# Patient Record
Sex: Female | Born: 2000 | Hispanic: No | Smoking: Former smoker
Health system: Southern US, Community
[De-identification: ages and names within clinical notes are randomized; demographics above are authoritative.]

## PROBLEM LIST (undated history)

## (undated) ENCOUNTER — Inpatient Hospital Stay (HOSPITAL_COMMUNITY): Payer: Self-pay

## (undated) VITALS — BP 123/79 | HR 92 | Temp 98.4°F | Resp 18 | Ht 65.0 in | Wt 208.3 lb

## (undated) DIAGNOSIS — M0800 Unspecified juvenile rheumatoid arthritis of unspecified site: Secondary | ICD-10-CM

## (undated) DIAGNOSIS — N390 Urinary tract infection, site not specified: Secondary | ICD-10-CM

## (undated) DIAGNOSIS — K219 Gastro-esophageal reflux disease without esophagitis: Secondary | ICD-10-CM

## (undated) DIAGNOSIS — L309 Dermatitis, unspecified: Secondary | ICD-10-CM

## (undated) DIAGNOSIS — F419 Anxiety disorder, unspecified: Secondary | ICD-10-CM

## (undated) DIAGNOSIS — F329 Major depressive disorder, single episode, unspecified: Secondary | ICD-10-CM

## (undated) DIAGNOSIS — J45909 Unspecified asthma, uncomplicated: Secondary | ICD-10-CM

## (undated) DIAGNOSIS — T7840XA Allergy, unspecified, initial encounter: Secondary | ICD-10-CM

## (undated) DIAGNOSIS — E669 Obesity, unspecified: Secondary | ICD-10-CM

## (undated) DIAGNOSIS — F32A Depression, unspecified: Secondary | ICD-10-CM

## (undated) DIAGNOSIS — H539 Unspecified visual disturbance: Secondary | ICD-10-CM

## (undated) DIAGNOSIS — R011 Cardiac murmur, unspecified: Secondary | ICD-10-CM

## (undated) DIAGNOSIS — R51 Headache: Secondary | ICD-10-CM

## (undated) DIAGNOSIS — F909 Attention-deficit hyperactivity disorder, unspecified type: Secondary | ICD-10-CM

## (undated) HISTORY — PX: BREAST SURGERY: SHX581

## (undated) HISTORY — PX: NO PAST SURGERIES: SHX2092

---
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
## 2019-01-10 ENCOUNTER — Encounter (HOSPITAL_COMMUNITY): Payer: Self-pay

## 2019-01-10 ENCOUNTER — Other Ambulatory Visit: Payer: Self-pay

## 2019-01-10 ENCOUNTER — Emergency Department (HOSPITAL_COMMUNITY)
Admission: EM | Admit: 2019-01-10 | Discharge: 2019-01-11 | Disposition: A | Discharge status: Home / Self Care | Payer: Self-pay | Attending: Emergency Medicine | Admitting: Emergency Medicine

## 2019-01-10 DIAGNOSIS — Z79899 Other long term (current) drug therapy: Secondary | ICD-10-CM | POA: Insufficient documentation

## 2019-01-10 DIAGNOSIS — R1011 Right upper quadrant pain: Secondary | ICD-10-CM | POA: Insufficient documentation

## 2019-01-10 DIAGNOSIS — J45909 Unspecified asthma, uncomplicated: Secondary | ICD-10-CM | POA: Insufficient documentation

## 2019-01-10 DIAGNOSIS — R109 Unspecified abdominal pain: Secondary | ICD-10-CM

## 2019-01-10 DIAGNOSIS — F17210 Nicotine dependence, cigarettes, uncomplicated: Secondary | ICD-10-CM | POA: Insufficient documentation

## 2019-01-10 LAB — COMPREHENSIVE METABOLIC PANEL
ALT: 22 U/L (ref 0–44)
AST: 21 U/L (ref 15–41)
Albumin: 4.1 g/dL (ref 3.5–5.0)
Alkaline Phosphatase: 72 U/L (ref 38–126)
Anion gap: 10 (ref 5–15)
BUN: 8 mg/dL (ref 6–20)
CO2: 22 mmol/L (ref 22–32)
Calcium: 9.3 mg/dL (ref 8.9–10.3)
Chloride: 105 mmol/L (ref 98–111)
Creatinine, Ser: 0.67 mg/dL (ref 0.44–1.00)
GFR calc Af Amer: >60 mL/min (ref >60)
GFR calc non Af Amer: >60 mL/min (ref >60)
Glucose, Bld: 84 mg/dL (ref 70–99)
Potassium: 3.8 mmol/L (ref 3.5–5.1)
Sodium: 137 mmol/L (ref 135–145)
Total Bilirubin: 0.2 mg/dL — ABNORMAL LOW (ref 0.3–1.2)
Total Protein: 7.9 g/dL (ref 6.5–8.1)

## 2019-01-10 LAB — CBC
HCT: 41.1 % (ref 36.0–46.0)
Hemoglobin: 13.2 g/dL (ref 12.0–15.0)
MCH: 26.2 pg (ref 26.0–34.0)
MCHC: 32.1 g/dL (ref 30.0–36.0)
MCV: 81.7 fL (ref 80.0–100.0)
Platelets: 262 10*3/uL (ref 150–400)
RBC: 5.03 MIL/uL (ref 3.87–5.11)
RDW: 14.2 % (ref 11.5–15.5)
WBC: 10.2 10*3/uL (ref 4.0–10.5)
nRBC: 0 % (ref 0.0–0.2)

## 2019-01-10 LAB — URINALYSIS, ROUTINE W REFLEX MICROSCOPIC
Bilirubin Urine: NEGATIVE
Glucose, UA: NEGATIVE mg/dL
Hgb urine dipstick: NEGATIVE
Ketones, ur: NEGATIVE mg/dL
Leukocytes,Ua: NEGATIVE
Nitrite: NEGATIVE
Protein, ur: NEGATIVE mg/dL
Specific Gravity, Urine: 1.023 (ref 1.005–1.030)
pH: 6 (ref 5.0–8.0)

## 2019-01-10 LAB — LIPASE, BLOOD: Lipase: 32 U/L (ref 11–51)

## 2019-01-10 LAB — HCG, QUANTITATIVE, PREGNANCY: hCG, Beta Chain, Quant, S: <1 m[IU]/mL (ref <5)

## 2019-01-10 MED ORDER — SODIUM CHLORIDE 0.9% FLUSH: 3.0000 mL | Freq: Once | INTRAVENOUS | Status: DC

## 2019-01-10 NOTE — ED Triage Notes (Signed)
Pt BIB EMS from home. Pt c/o abdominal pain and R sided flank pain x 3 days. Pt states she took tylenol and tums with no relief. Pt states she could be pregnant.   160/90 80 HR 99% RA 98.0   CBG 122

## 2019-01-10 NOTE — ED Provider Notes (Signed)
[facility]-EMERGENCY DEPT Provider Note   CSN: [id number] Arrival date & time: 01/10/19  2130     History   Chief Complaint Chief Complaint  Patient presents with  . Abdominal Pain  . Flank Pain    HPI Michelle Stephens is a 18 y.o. female.     The history is provided by the patient and medical records.  Abdominal Pain Flank Pain Associated symptoms include abdominal pain.     18 year old female with history of ADHD, seasonal allergies, anxiety, depression, asthma, obesity, presenting to the ED with right-sided flank pain for the past 3 days.  States it does radiate somewhat towards her right groin.  She denies any dysuria or hematuria.  She does feel some pressure when having a bowel movement, however they have been normal.  Last menstrual period was 12/18/2018, was normal.  States she is actively trying to get pregnant so unsure if she is or not.  She did have a miscarriage earlier this year.  No vaginal discharge.  No concern for STD.  Has had some nausea but states "I have a sensitive stomach" so not entirely unusual for her.   She is requesting to eat/drink here.  Past Medical History:  Diagnosis Date  . ADHD (attention deficit hyperactivity disorder)   . Allergy    seasonal  . Anxiety   . Asthma   . Depression   . Headache(784.0)   . Heart murmur    when a baby  . JRA (juvenile rheumatoid arthritis) (HCC)   . Obesity   . Urinary tract infection   . Vision abnormalities     Patient Active Problem List   Diagnosis Date Noted  . Self-inflicted injury   . PTSD (post-traumatic stress disorder) 02/04/2013  . ADHD (attention deficit hyperactivity disorder), combined type 02/04/2013  . MDD (major depressive disorder), recurrent episode, severe 11/07/2011    Class: Diagnosis of    Past Surgical History:  Procedure Laterality Date  . NO PAST SURGERIES       OB History    Gravida  1   Para      Term      Preterm      AB      Living  0     SAB      TAB      Ectopic      Multiple      Live Births               Home Medications    Prior to Admission medications   Medication Sig Start Date End Date Taking? Authorizing Provider  cetirizine (ZYRTEC) 10 MG tablet Take 10 mg by mouth daily.  02/04/13   Winson, Louie BunKim B, NP  fluticasone (FLONASE) 50 MCG/ACT nasal spray inhale one to two sprays each nare daily 05/20/17   [provider]  metroNIDAZOLE (FLAGYL) 500 MG tablet Take 1 tablet (500 mg total) by mouth 2 (two) times daily. 05/03/18   Gerrit HeckEmly, Jessica, CNM  Prenatal Vit-Fe Fumarate-FA (PRENATAL MULTIVITAMIN) TABS tablet Take 1 tablet by mouth daily at 12 noon.    [provider]  ranitidine (ZANTAC) 150 MG tablet Take 150 mg by mouth 2 (two) times daily.    [provider]    Family History Family History  Problem Relation Age of Onset  . Asthma Mother   . Diabetes Mother   . Hypertension Mother   . Depression Mother   . Mental illness Mother  Social History Social History   Tobacco Use  . Smoking status: Current Every Day Smoker    Packs/day: 0.25    Types: Cigarettes  . Smokeless tobacco: Former Network engineer Use Topics  . Alcohol use: Not Currently    Comment: 5 times/month drinks liquor  . Drug use: Yes    Types: Marijuana     Allergies   Coconut flavor and Penicillins   Review of Systems Review of Systems  Gastrointestinal: Positive for abdominal pain.  Genitourinary: Positive for flank pain.  All other systems reviewed and are negative.    Physical Exam Updated Vital Signs BP 131/74 (BP Location: Right Arm)   Pulse 80   Temp 98 F (36.7 C) (Oral)   Resp 16   Ht 5\' 5"  (1.651 m)   Wt (!) 142.9 kg   LMP 03/21/2018   SpO2 98%   BMI 52.42 kg/m   Physical Exam Vitals signs and nursing note reviewed.  Constitutional:      Appearance: She is well-developed.     Comments: Morbidly obese  HENT:     Head: Normocephalic and atraumatic.   Eyes:     Conjunctiva/sclera: Conjunctivae normal.     Pupils: Pupils are equal, round, and reactive to light.  Neck:     Musculoskeletal: Normal range of motion.  Cardiovascular:     Rate and Rhythm: Normal rate and regular rhythm.     Heart sounds: Normal heart sounds.  Pulmonary:     Effort: Pulmonary effort is normal.     Breath sounds: Normal breath sounds.  Abdominal:     General: Bowel sounds are normal.     Palpations: Abdomen is soft.     Tenderness: There is no abdominal tenderness. There is no right CVA tenderness or left CVA tenderness.     Comments: Obese abdomen but soft and nontender, no CVA tenderness  Musculoskeletal: Normal range of motion.  Skin:    General: Skin is warm and dry.  Neurological:     Mental Status: She is alert and oriented to person, place, and time.      ED Treatments / Results  Labs (all labs ordered are listed, but only abnormal results are displayed) Labs Reviewed  COMPREHENSIVE METABOLIC PANEL - Abnormal; Notable for the following components:      Result Value   Total Bilirubin 0.2 (*)    All other components within normal limits  LIPASE, BLOOD  CBC  URINALYSIS, ROUTINE W REFLEX MICROSCOPIC  HCG, QUANTITATIVE, PREGNANCY  I-STAT BETA HCG BLOOD, ED ([facility], [facility], [facility] ONLY)    EKG None  Radiology No results found.  Procedures Procedures (including critical care time)  Medications Ordered in ED Medications  sodium chloride flush (NS) 0.9 % injection 3 mL (has no administration in time range)  oxyCODONE-acetaminophen (PERCOCET/ROXICET) 5-325 MG per tablet 1 tablet (1 tablet Oral Given 01/11/19 0126)     Initial Impression / Assessment and Plan / ED Course  I have reviewed the triage vital signs and the nursing notes.  Pertinent labs & imaging results that were available during my care of the patient were reviewed by me and considered in my medical decision making (see chart for details).  18 year old female here with  right-sided flank and abdominal pain.  This is been ongoing for 3 days.  She denies any urinary symptoms, pelvic pain, vaginal discharge, or irregular bleeding.  She is afebrile and nontoxic.  Labs are overall reassuring.  Pregnancy test is negative.  UA without  any noted blood or signs of infection.  Patient appears very comfortable here, she is eating and drinking throughout ER visit, no significant tenderness noted on exam.  I do not feel she needs further work-up with CT scan or other emergent imaging.  I recommended that she follow-up closely with her primary care doctor.  She also expresses concern that she has not been able to get pregnant again since her miscarriage earlier this year, I have recommended that she follow-up with her OB/GYN about this to discuss further concerns.  She may return here for any new/acute changes.  Final Clinical Impressions(s) / ED Diagnoses   Final diagnoses:  Flank pain    ED Discharge Orders    None       Garlon Hatchet, PA-C 01/11/19 0340    Nira Conn, MD 01/11/19 [phone]

## 2019-01-11 MED ORDER — OXYCODONE-ACETAMINOPHEN 5-325 MG PO TABS
1.0000 | ORAL_TABLET | Freq: Once | ORAL | Status: AC
  Administered 2019-01-11: 1 via ORAL
  Filled 2019-01-11: qty 1

## 2019-01-11 NOTE — Discharge Instructions (Signed)
Labs today were reassuring.  Urine without blood or signs of infection. Recommend to establish care with primary care doctor for follow-up. Continue to follow with OB-GYN for any ongoing pregnancy/fertility concerns. Return here for any new/acute changes.

## 2019-01-11 NOTE — ED Notes (Signed)
Patient waiting on transportation home.

## 2019-04-16 IMAGING — DX DG CHEST 2V
2 series · 2 of 2 positions shown · non-contrast
Comparison: 06/12/2017

CLINICAL DATA: Cough and chest pain

EXAM:
CHEST - 2 VIEW

[chest pa]
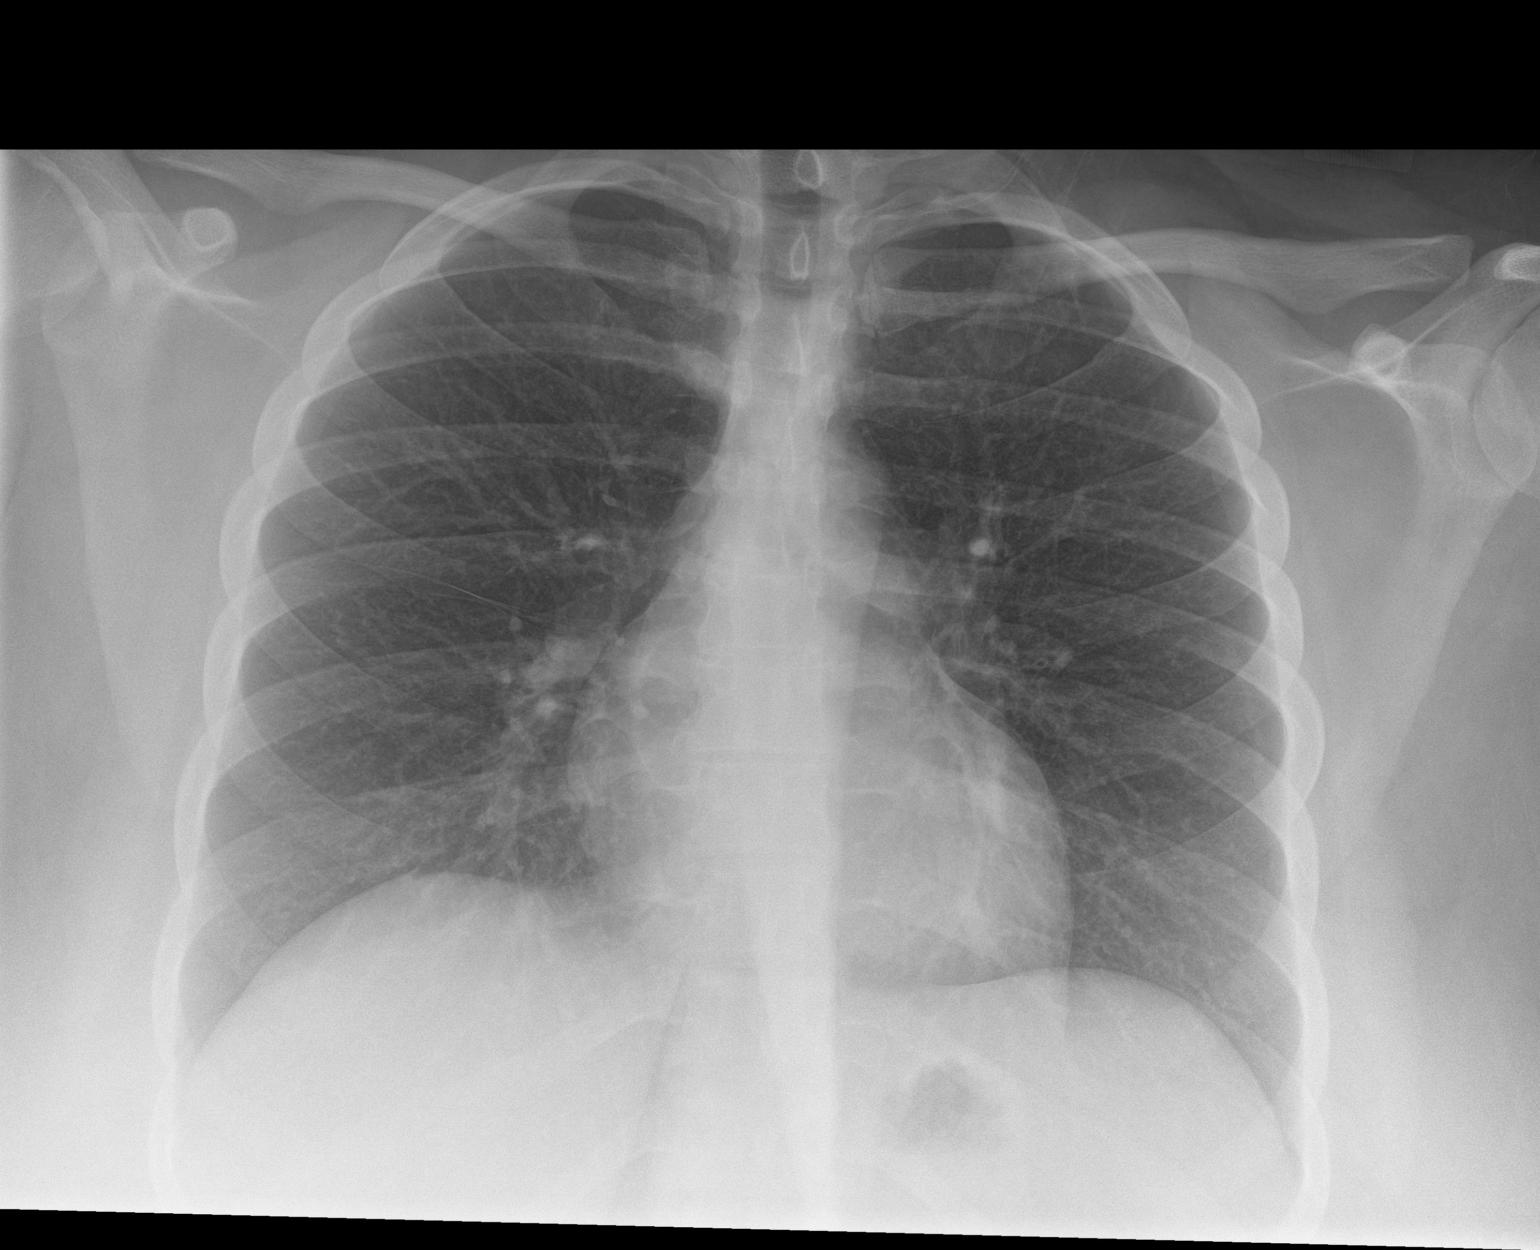

[chest lat]
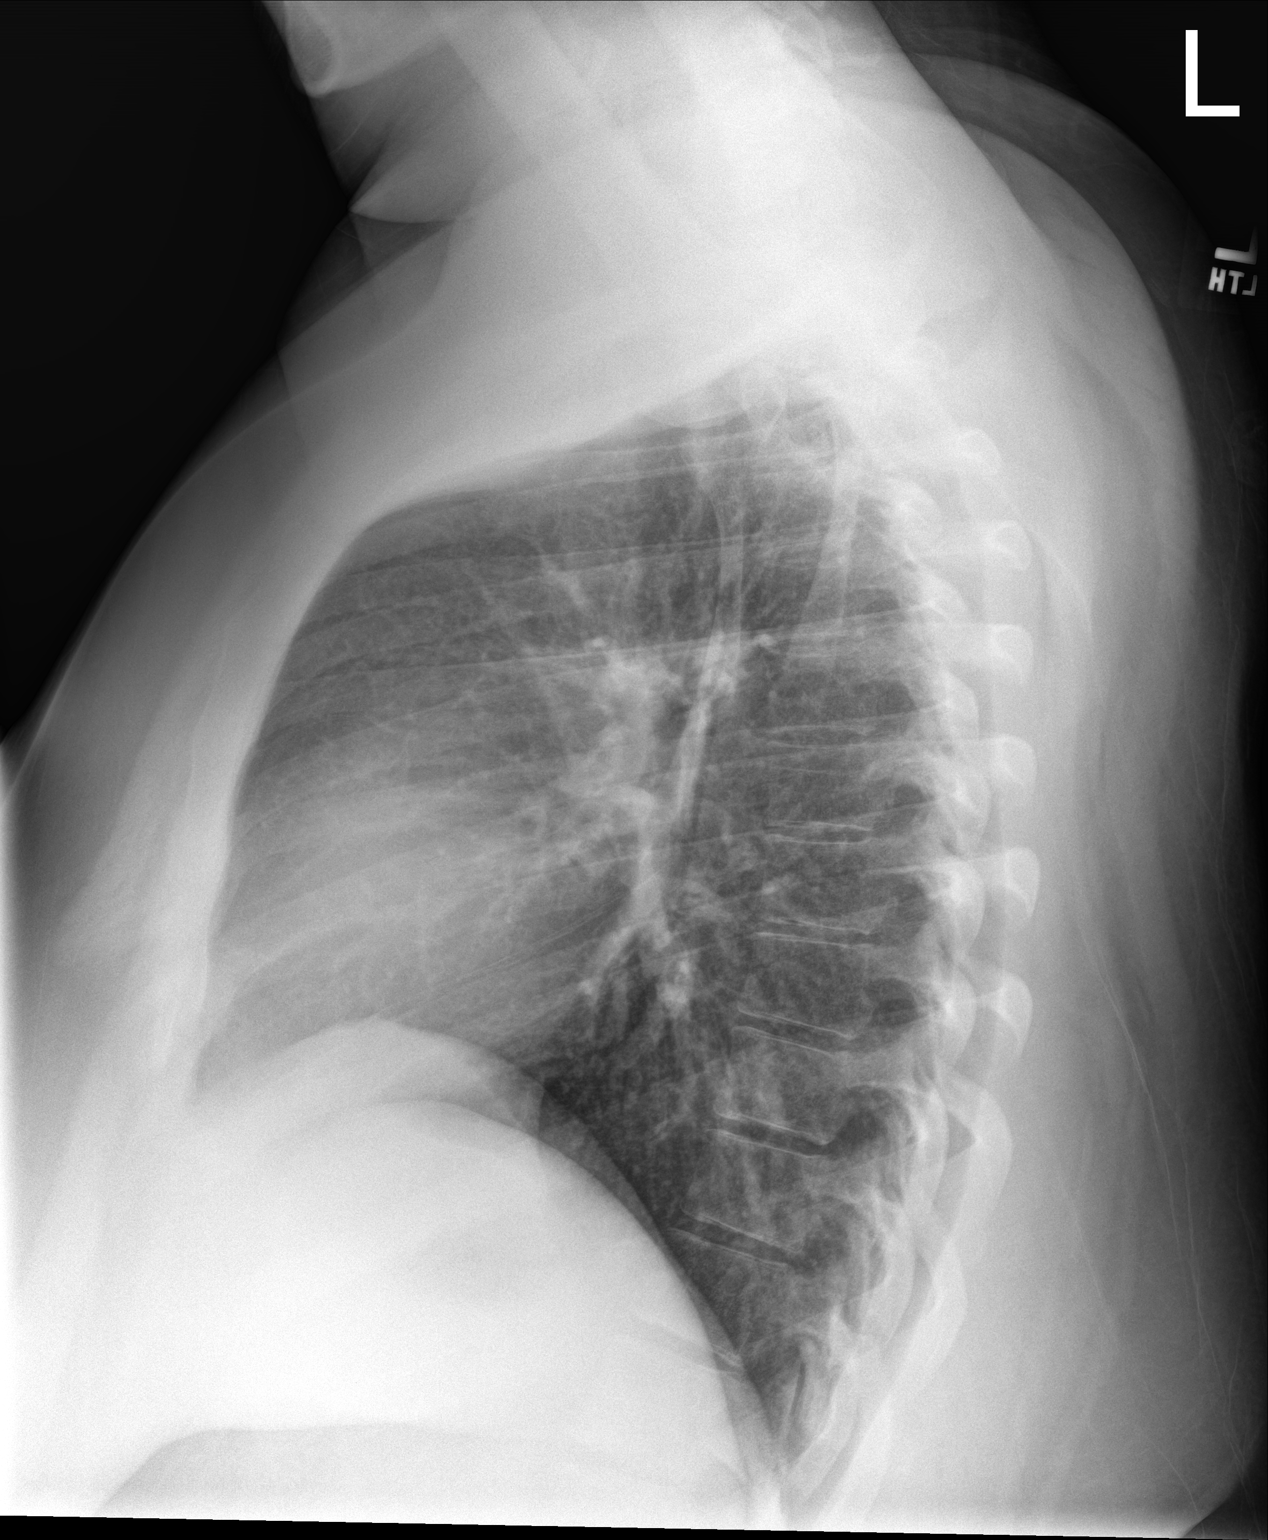

[2 of 2 positions shown; findings below may reference images not displayed]

FINDINGS: The heart size and mediastinal contours are within normal limits.
Both lungs are clear. The visualized skeletal structures are
unremarkable.
IMPRESSION: No active cardiopulmonary disease.

## 2019-06-11 ENCOUNTER — Encounter (HOSPITAL_COMMUNITY): Payer: Self-pay

## 2019-06-11 ENCOUNTER — Emergency Department (HOSPITAL_COMMUNITY)
Admission: EM | Admit: 2019-06-11 | Discharge: 2019-06-11 | Disposition: A | Discharge status: Home / Self Care | Payer: Self-pay | Attending: Emergency Medicine | Admitting: Emergency Medicine

## 2019-06-11 ENCOUNTER — Other Ambulatory Visit: Payer: Self-pay

## 2019-06-11 DIAGNOSIS — J302 Other seasonal allergic rhinitis: Secondary | ICD-10-CM

## 2019-06-11 DIAGNOSIS — Z79899 Other long term (current) drug therapy: Secondary | ICD-10-CM | POA: Insufficient documentation

## 2019-06-11 DIAGNOSIS — M26622 Arthralgia of left temporomandibular joint: Secondary | ICD-10-CM | POA: Insufficient documentation

## 2019-06-11 DIAGNOSIS — M26629 Arthralgia of temporomandibular joint, unspecified side: Secondary | ICD-10-CM

## 2019-06-11 MED ORDER — FEXOFENADINE HCL 60 MG PO TABS: 60.0000 mg | ORAL_TABLET | Freq: Two times a day (BID) | ORAL | 0 refills | Status: DC

## 2019-06-11 MED ORDER — NAPROXEN 500 MG PO TABS: 500.0000 mg | ORAL_TABLET | Freq: Two times a day (BID) | ORAL | 0 refills | Status: AC

## 2019-06-11 NOTE — Discharge Instructions (Addendum)
You may alternate taking Tylenol and Naproxen as needed for pain control. You may take Naproxen twice daily as directed on your discharge paperwork and you may take  [phone] mg of Tylenol every 6 hours. Do not exceed 4000 mg of Tylenol daily as this can lead to liver damage. Also, make sure to take Naproxen with meals as it can cause an upset stomach. Do not take other NSAIDs while taking Naproxen such as (Aleve, Ibuprofen, Aspirin, Celebrex, etc) and do not take more than the prescribed dose as this can lead to ulcers and bleeding in your GI tract. You may use warm and cold compresses to help with your symptoms.   Please use allegra as directed. Stop using your afrin, claritin and benadryl. Continue using your Flonase.  Please follow up with your primary care provider within 5-7 days for re-evaluation of your symptoms. If you do not have a primary care provider, information for a healthcare clinic has been provided for you to make arrangements for follow up care. Please return to the emergency department for any new or worsening symptoms.   Please follow up with your primary doctor within the next 7-10 days for re-evaluation and further treatment of your symptoms.   Please return to the ER sooner if you have any new or worsening symptoms.

## 2019-06-11 NOTE — ED Triage Notes (Signed)
Patient c/o seasonal allergies, left ear pain and shooting pain into her jaw x months, but worsening. Patient also c/o nasal congestin.

## 2019-06-11 NOTE — ED Provider Notes (Signed)
[facility] DEPT Provider Note   CSN: [id number] Arrival date & time: 06/11/19  1512     History Chief Complaint  Patient presents with  . Otalgia  . Jaw Pain  . Nasal Congestion    Michelle Stephens is a 19 y.o. female.  HPI   Patient is a 19 year old female with history of ADHD, anxiety, asthma, depression, heart murmur, juvenile rheumatoid arthritis, who presents to the emergency department today for evaluation of seasonal allergies and left jaw pain.  States that her allergies have been flaring up for the last month.  She has had watery/itchy/dry eyes, nasal congestion, rhinorrhea, scratchy throat, and intermittent cough.  She has been using Afrin, Benadryl, Flonase and Claritin without significant relief.  She denies any fevers.  She also reports left jaw pain.  States it starts just anterior to the left ear and radiates forward and into her ear.  This has also been present for several weeks.  It is worse when she opens her mouth.  She is not aware if she grits her teeth are not.  Denies any interventions for symptoms.  Past Medical History:  Diagnosis Date  . ADHD (attention deficit hyperactivity disorder)   . Allergy    seasonal  . Anxiety   . Asthma   . Depression   . Headache(784.0)   . Heart murmur    when a baby  . JRA (juvenile rheumatoid arthritis) ([place])   . Obesity   . Urinary tract infection   . Vision abnormalities     Patient Active Problem List   Diagnosis Date Noted  . Self-inflicted injury   . PTSD (post-traumatic stress disorder) 02/04/2013  . ADHD (attention deficit hyperactivity disorder), combined type 02/04/2013  . MDD (major depressive disorder), recurrent episode, severe 11/07/2011    Class: Diagnosis of    Past Surgical History:  Procedure Laterality Date  . NO PAST SURGERIES       OB History    Gravida  1   Para      Term      Preterm      AB      Living  0     SAB      TAB      Ectopic      Multiple      Live Births              Family History  Problem Relation Age of Onset  . Asthma Mother   . Diabetes Mother   . Hypertension Mother   . Depression Mother   . Mental illness Mother     Social History   Tobacco Use  . Smoking status: Current Every Day Smoker    Packs/day: 0.25    Types: Cigarettes  . Smokeless tobacco: Former Network engineer Use Topics  . Alcohol use: Not Currently  . Drug use: Yes    Types: Marijuana    Home Medications Prior to Admission medications   Medication Sig Start Date End Date Taking? Authorizing Provider  acetaminophen (TYLENOL) 500 MG tablet Take 1,000 mg by mouth every 6 (six) hours as needed for mild pain, moderate pain or headache.    [provider]  fexofenadine (ALLEGRA ALLERGY) 60 MG tablet Take 1 tablet (60 mg total) by mouth 2 (two) times daily. 06/11/19 07/11/19  Chandel Zaun S, PA-C  naproxen (NAPROSYN) 500 MG tablet Take 1 tablet (500 mg total) by mouth 2 (two) times daily for 7 days. 06/11/19  06/18/19  Lynsee Wands S, PA-C    Allergies    Coconut flavor and Penicillins  Review of Systems   Review of Systems  Constitutional: Negative for fever.  HENT: Positive for congestion, ear pain, rhinorrhea, sinus pressure, sinus pain and sneezing. Negative for sore throat.        Left jaw pain  Eyes: Positive for itching. Negative for pain and visual disturbance.  Respiratory: Positive for cough (mild, intermittent).   Cardiovascular: Negative for chest pain.  Gastrointestinal: Negative for abdominal pain, nausea and vomiting.  Genitourinary: Negative for dysuria and hematuria.  Musculoskeletal: Negative for myalgias.  Skin: Negative for rash.  Neurological: Negative for headaches.  All other systems reviewed and are negative.   Physical Exam Updated Vital Signs BP 126/86 (BP Location: Left Arm)   Pulse 67   Temp 98.3 F (36.8 C) (Oral)   Resp 16   Ht 5\' 5"  (1.651 m)   Wt (!) 145.2  kg   LMP 05/22/2019 (Approximate)   SpO2 100%   BMI 53.27 kg/m   Physical Exam Vitals and nursing note reviewed.  Constitutional:      General: She is not in acute distress.    Appearance: She is well-developed. She is not ill-appearing.  HENT:     Head: Normocephalic and atraumatic.     Ears:     Comments: bilat tms have serous fluid present. No erythema or bulging of tms    Nose:     Comments: bilat nasal turbinates or boggy and erythematous    Mouth/Throat:     Mouth: Mucous membranes are moist.     Pharynx: No oropharyngeal exudate or posterior oropharyngeal erythema.     Comments: ttp to the left TMJ that reproduces sxs, no trismus Eyes:     Conjunctiva/sclera: Conjunctivae normal.  Cardiovascular:     Rate and Rhythm: Normal rate and regular rhythm.  Pulmonary:     Effort: Pulmonary effort is normal.     Breath sounds: Normal breath sounds.  Musculoskeletal:        General: Normal range of motion.     Cervical back: Neck supple.  Skin:    General: Skin is warm and dry.  Neurological:     Mental Status: She is alert.     ED Results / Procedures / Treatments   Labs (all labs ordered are listed, but only abnormal results are displayed) Labs Reviewed - No data to display  EKG None  Radiology No results found.  Procedures Procedures (including critical care time)  Medications Ordered in ED Medications - No data to display  ED Course  I have reviewed the triage vital signs and the nursing notes.  Pertinent labs & imaging results that were available during my care of the patient were reviewed by me and considered in my medical decision making (see chart for details).    MDM Rules/Calculators/A&P                       Patient presenting with symptoms consistent with seasonal allergies.  Vital signs are stable and patient is afebrile.  Nontoxic and nonseptic appearing.  Physical exam was within normal limits.  HEENT exam is grossly normal without evidence  of bacterial infection.  Will give symptomatic treatment with allergy medications.  Advised her that afrin can cause rebound nasal congestion and she should stop using this medication as it is likely exacerbating her sxs. Additionally, she is c/o left jaw pain. She has ttp  to the left TMJ that exacerbates sxs. Suspect tmj dysfuction. Will start on antiinflammatories. Advised to obtain bite guard. Advise follow-up with PCP in 1 week and discussed reasons to return immediately to the ED.  All questions answered and patient understands the plan and reasons to return.  Final Clinical Impression(s) / ED Diagnoses Final diagnoses:  Seasonal allergies  TMJ pain dysfunction syndrome    Rx / DC Orders ED Discharge Orders         Ordered    fexofenadine (ALLEGRA ALLERGY) 60 MG tablet  2 times daily     06/11/19 1620    naproxen (NAPROSYN) 500 MG tablet  2 times daily     06/11/19 [address] [place] Bearse S, PA-C 06/11/19 1621    Vanetta Mulders, MD 06/21/19 [phone]

## 2019-10-03 IMAGING — US TRANSVAGINAL OB ULTRASOUND
1 series · 15 of 28 positions shown · non-contrast
Comparison: 05/03/2018

CLINICAL DATA: Vaginal bleeding. Quantitative beta HCG is 6666.
patient is 7 weeks 5 days and has EDC of 12/26/2018. Inappropriate
rise of beta HCG.

EXAM:
OBSTETRIC <14 WK US AND TRANSVAGINAL OB US
TECHNIQUE: Both transabdominal and transvaginal ultrasound examinations were
performed for complete evaluation of the gestation as well as the
maternal uterus, adnexal regions, and pelvic cul-de-sac.
Transvaginal technique was performed to assess early pregnancy.

[Series 1: transvaginal ob ultrasound · 39 acquisitions, 15 frames shown]
[im 1/39]
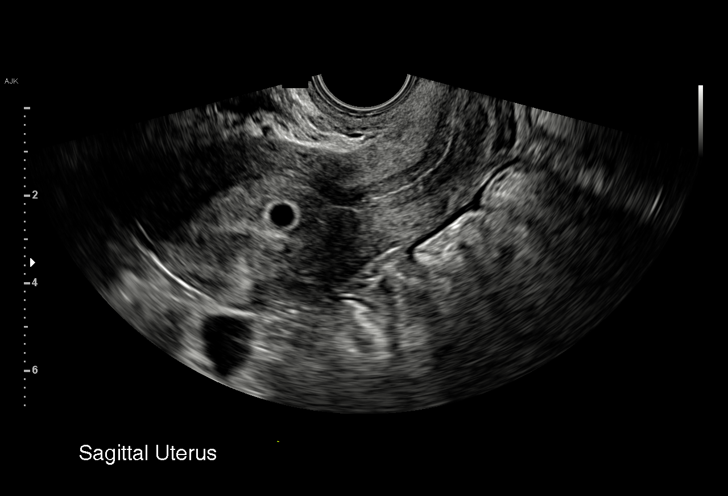
[im 3/39]
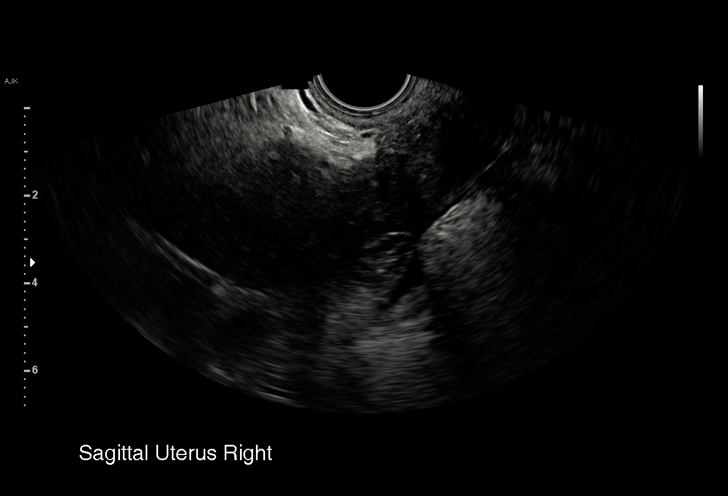
[im 6/39]
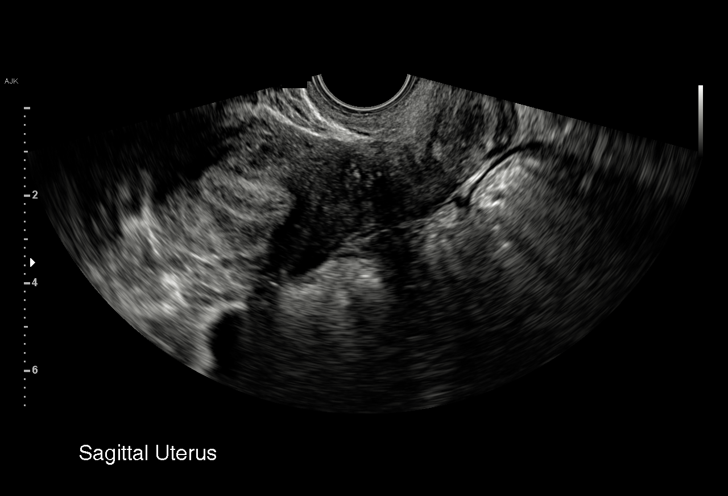
[im 9/39]
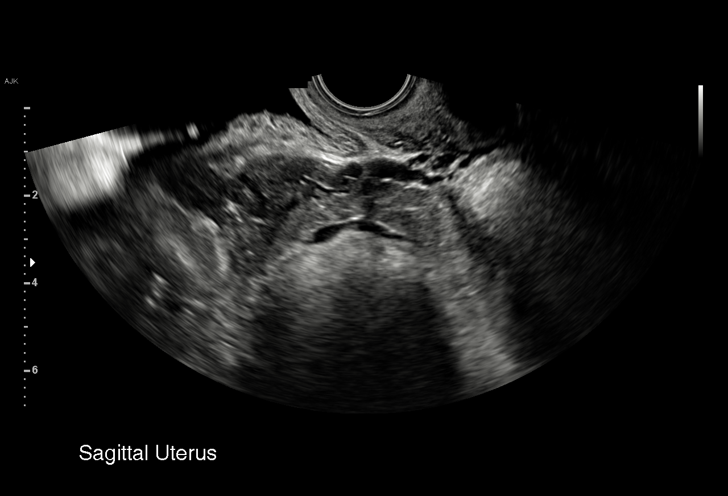
[im 12/39]
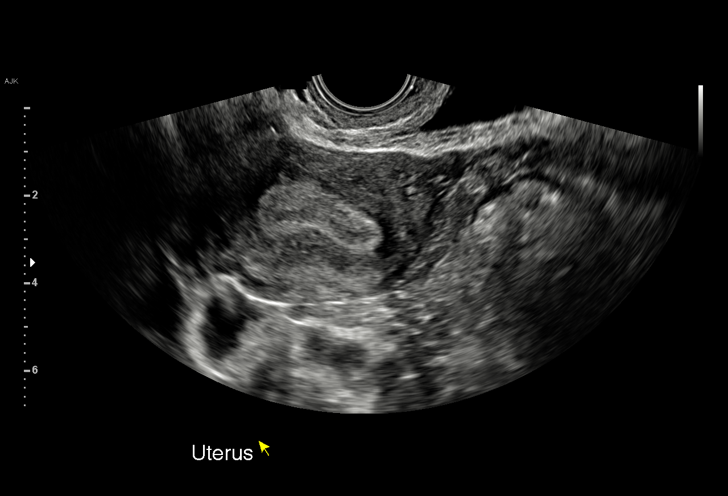
[im 15/39]
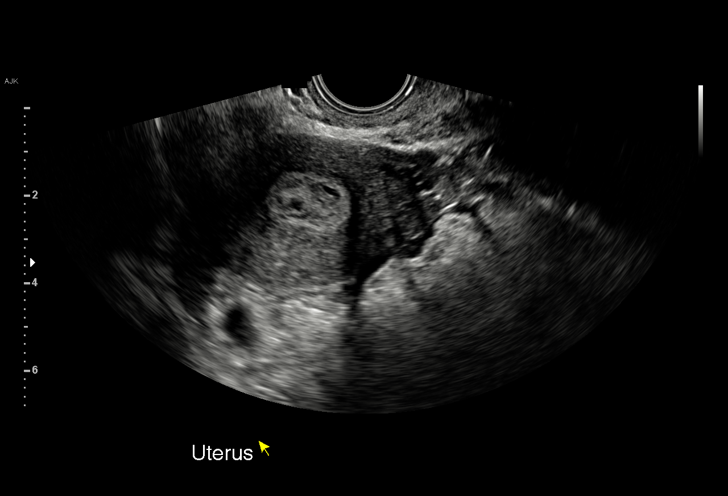
[im 17/39]
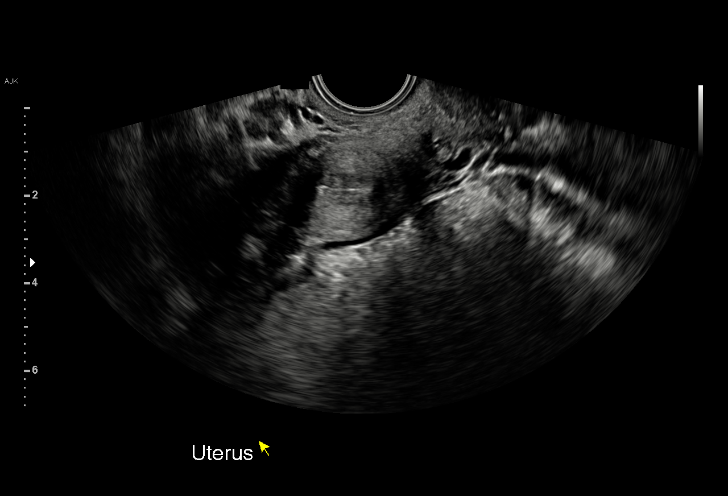
[im 20/39]
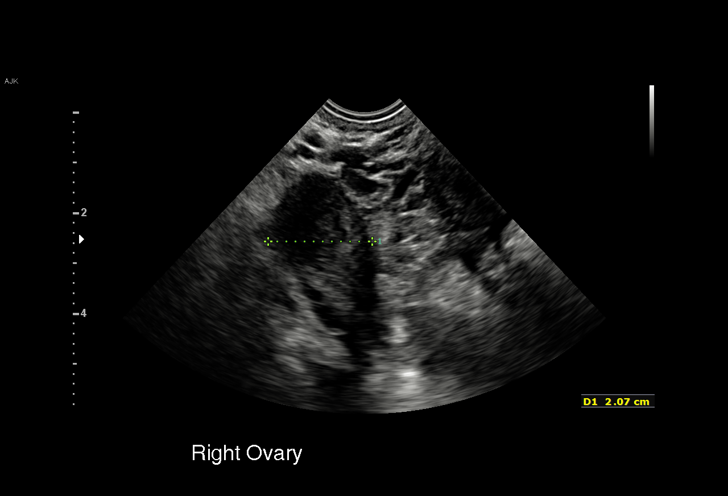
[im 22/39]
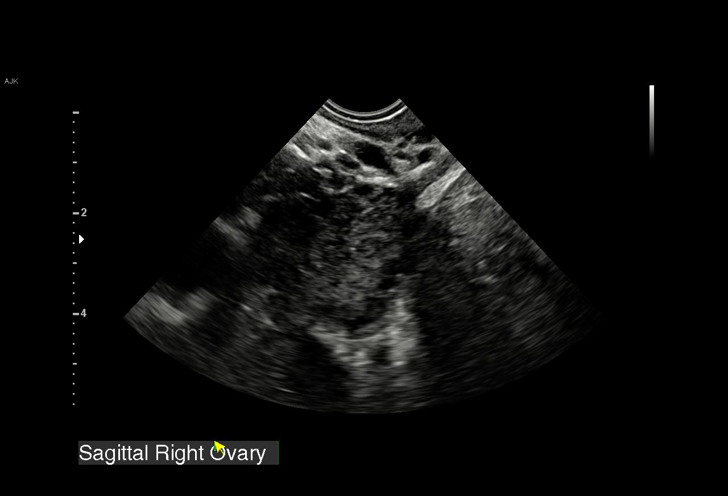
[im 24/39]
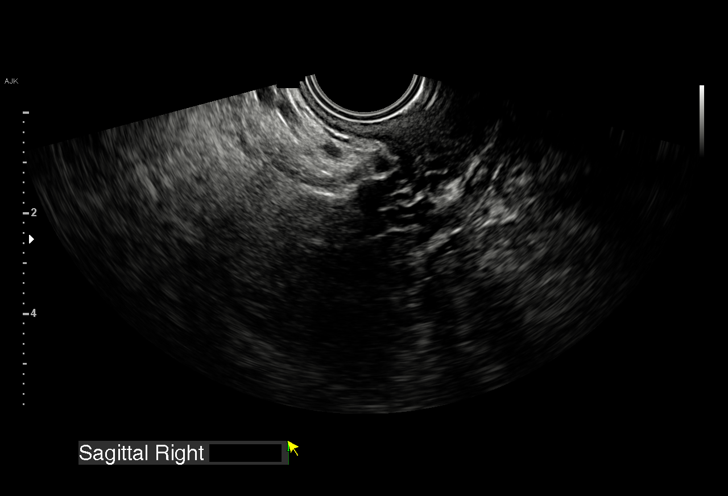
[im 27/39]
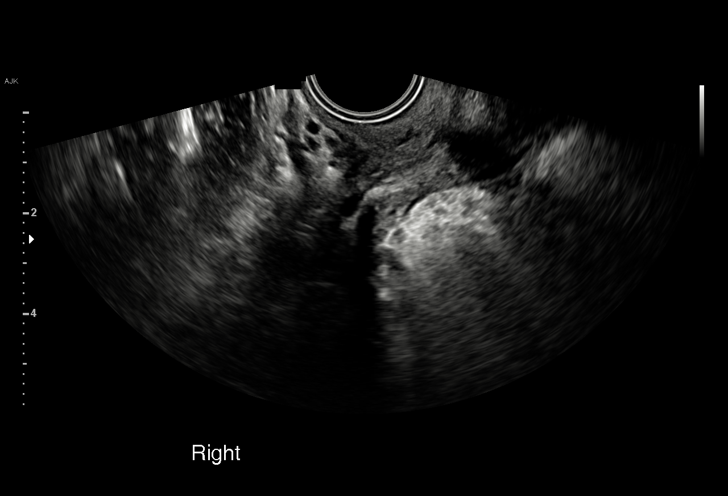
[im 30/39]
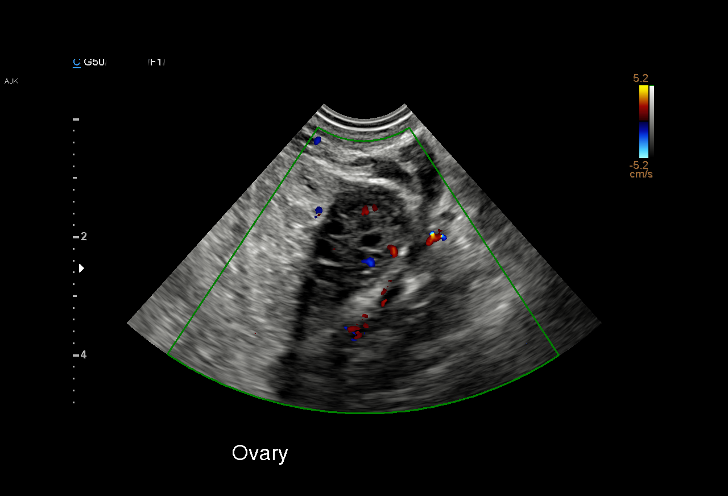
[im 33/39]
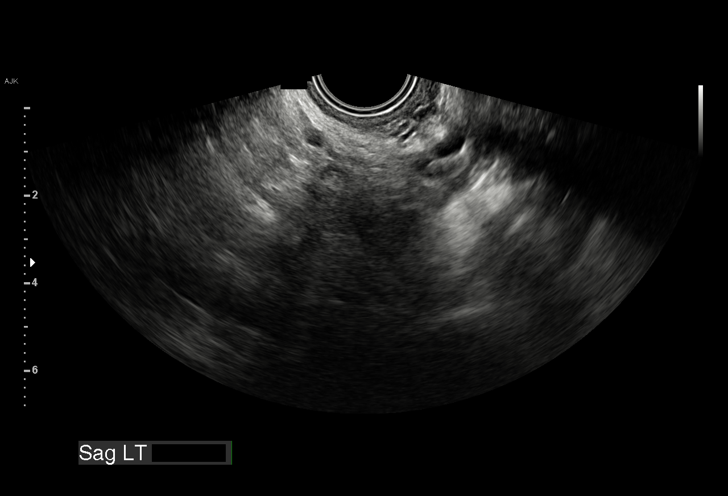
[im 36/39]
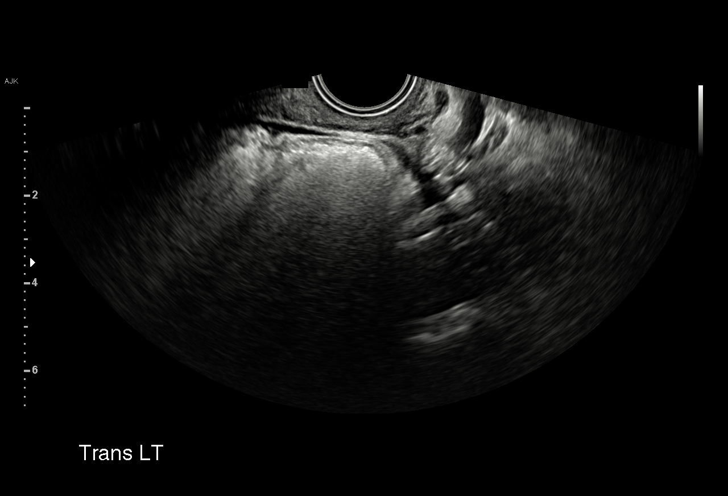
[im 39/39]
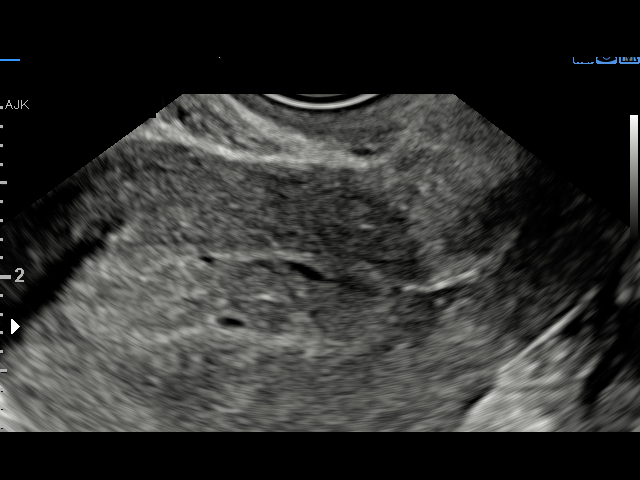

[15 of 28 positions shown; findings below may reference images not displayed]

FINDINGS: Intrauterine gestational sac: Single

Yolk sac:  Not Visualized.

Embryo:  Not Visualized.

Cardiac Activity: Not Visualized.

Heart Rate: Absent bpm

MSD: 6.9 mm   5 w   2 d

Subchorionic hemorrhage:  None visualized.

Maternal uterus/adnexae: Normal appearance of both ovaries. RIGHT
corpus luteum cyst is present.
IMPRESSION: 1. Persistent presence of gestational sac without development of
yolk sac/embryo.
2. Given the interval of 10 days since the previous exam showing
gestational sac and no yolk sac, the findings are suspicious for non
viability.
3. The lack of an embryo greater more than 6 weeks following LMP is
also suspicious for non viability.
4. As needed, to establish ultrasound criteria for definitive
nonviable pregnancy, consider a follow-up ultrasound on 05/17/2018
or later.

## 2019-10-22 ENCOUNTER — Ambulatory Visit (HOSPITAL_COMMUNITY)
Admission: EM | Admit: 2019-10-22 | Discharge: 2019-10-22 | Disposition: A | Discharge status: Home / Self Care | Payer: Self-pay | Attending: Family Medicine | Admitting: Family Medicine

## 2019-10-22 ENCOUNTER — Encounter (HOSPITAL_COMMUNITY): Payer: Self-pay | Admitting: Emergency Medicine

## 2019-10-22 ENCOUNTER — Other Ambulatory Visit: Payer: Self-pay

## 2019-10-22 DIAGNOSIS — J029 Acute pharyngitis, unspecified: Secondary | ICD-10-CM

## 2019-10-22 DIAGNOSIS — J039 Acute tonsillitis, unspecified: Secondary | ICD-10-CM | POA: Insufficient documentation

## 2019-10-22 DIAGNOSIS — R000 Tachycardia, unspecified: Secondary | ICD-10-CM | POA: Insufficient documentation

## 2019-10-22 LAB — POCT RAPID STREP A, ED / UC: Streptococcus, Group A Screen (Direct): NEGATIVE

## 2019-10-22 MED ORDER — CEFTRIAXONE SODIUM 1 G IJ SOLR
1.0000 g | Freq: Once | INTRAMUSCULAR | Status: AC
  Administered 2019-10-22: 1 g via INTRAMUSCULAR

## 2019-10-22 MED ORDER — KETOROLAC TROMETHAMINE 30 MG/ML IJ SOLN
INTRAMUSCULAR | Status: AC
  Filled 2019-10-22: qty 1

## 2019-10-22 MED ORDER — LIDOCAINE HCL (PF) 1 % IJ SOLN
INTRAMUSCULAR | Status: AC
  Filled 2019-10-22: qty 4

## 2019-10-22 MED ORDER — DEXAMETHASONE SODIUM PHOSPHATE 10 MG/ML IJ SOLN
10.0000 mg | Freq: Once | INTRAMUSCULAR | Status: AC
  Administered 2019-10-22: 10 mg via INTRAMUSCULAR

## 2019-10-22 MED ORDER — SULFAMETHOXAZOLE-TRIMETHOPRIM 800-160 MG PO TABS: 1.0000 | ORAL_TABLET | Freq: Two times a day (BID) | ORAL | 0 refills | Status: AC

## 2019-10-22 MED ORDER — KETOROLAC TROMETHAMINE 30 MG/ML IJ SOLN
30.0000 mg | Freq: Once | INTRAMUSCULAR | Status: AC
  Administered 2019-10-22: 30 mg via INTRAMUSCULAR

## 2019-10-22 MED ORDER — DEXAMETHASONE SODIUM PHOSPHATE 10 MG/ML IJ SOLN
INTRAMUSCULAR | Status: AC
  Filled 2019-10-22: qty 1

## 2019-10-22 MED ORDER — CEFTRIAXONE SODIUM 1 G IJ SOLR
INTRAMUSCULAR | Status: AC
  Filled 2019-10-22: qty 10

## 2019-10-22 NOTE — ED Triage Notes (Signed)
Pt presents with sore throat xs 3-5 days. States pain goes into neck. States hurts to eat or drink.   Denies n,v. Loss of taste or smell.   States has been taking liquid pain reliever and ZQuil. Pt states she had Covid over 2 weeks ago but does not feel these are the same symptoms.

## 2019-10-22 NOTE — ED Provider Notes (Signed)
[facility]   [id number] 10/22/19 Arrival Time: 1312  ER:XVQM THROAT  SUBJECTIVE: History from: patient.  Michelle Stephens is a 19 y.o. female who presents with abrupt onset of sore throat for 3 days. Denies sick exposure to Covid, strep, flu or mono, or precipitating event. Is recovering from Covid. Has taken OTC medications without relief. Has not completed Covid vaccines. Symptoms are made worse with swallowing, but tolerating liquids and own secretions without difficulty.  Denies previous symptoms in the past.     Denies fever, chills, fatigue, sinus pain, rhinorrhea, nasal congestion, cough, SOB, wheezing, chest pain, nausea, rash, changes in bowel or bladder habits.     ROS: As per HPI.  All other pertinent ROS negative.     Past Medical History:  Diagnosis Date  . ADHD (attention deficit hyperactivity disorder)   . Allergy    seasonal  . Anxiety   . Asthma   . Depression   . Headache(784.0)   . Heart murmur    when a baby  . JRA (juvenile rheumatoid arthritis) (HCC)   . Obesity   . Urinary tract infection   . Vision abnormalities    Past Surgical History:  Procedure Laterality Date  . NO PAST SURGERIES     Allergies  Allergen Reactions  . Coconut Flavor Hives  . Penicillins Other (See Comments)    unknown   No current facility-administered medications on file prior to encounter.   Current Outpatient Medications on File Prior to Encounter  Medication Sig Dispense Refill  . acetaminophen (TYLENOL) 500 MG tablet Take 1,000 mg by mouth every 6 (six) hours as needed for mild pain, moderate pain or headache.    . fexofenadine (ALLEGRA ALLERGY) 60 MG tablet Take 1 tablet (60 mg total) by mouth 2 (two) times daily. 60 tablet 0   Social History   Socioeconomic History  . Marital status: Single    Spouse name: Not on file  . Number of children: Not on file  . Years of education: Not on file  . Highest education level: Not on file  Occupational  History  . Occupation: Dentist: UNEMPLOYED    Comment: 7th grade at [employer]  Tobacco Use  . Smoking status: Current Every Day Smoker    Packs/day: 0.25    Types: Cigarettes  . Smokeless tobacco: Former Clinical biochemist  . Vaping Use: Every day  . Substances: Nicotine, Flavoring  Substance and Sexual Activity  . Alcohol use: Not Currently  . Drug use: Yes    Types: Marijuana  . Sexual activity: Yes    Birth control/protection: None  Other Topics Concern  . Not on file  Social History Narrative  . Not on file   Social Determinants of Health   Financial Resource Strain:   . Difficulty of Paying Living Expenses: Not on file  Food Insecurity:   . Worried About Programme researcher, broadcasting/film/video in the Last Year: Not on file  . Ran Out of Food in the Last Year: Not on file  Transportation Needs:   . Lack of Transportation (Medical): Not on file  . Lack of Transportation (Non-Medical): Not on file  Physical Activity:   . Days of Exercise per Week: Not on file  . Minutes of Exercise per Session: Not on file  Stress:   . Feeling of Stress : Not on file  Social Connections:   . Frequency of Communication with Friends and Family: Not on file  .  Frequency of Social Gatherings with Friends and Family: Not on file  . Attends Religious Services: Not on file  . Active Member of Clubs or Organizations: Not on file  . Attends Banker Meetings: Not on file  . Marital Status: Not on file  Intimate Partner Violence:   . Fear of Current or Ex-Partner: Not on file  . Emotionally Abused: Not on file  . Physically Abused: Not on file  . Sexually Abused: Not on file   Family History  Problem Relation Age of Onset  . Asthma Mother   . Diabetes Mother   . Hypertension Mother   . Depression Mother   . Mental illness Mother     OBJECTIVE:  Vitals:   10/22/19 1354  BP: 120/89  Pulse: (!) 113  Resp: 18  Temp: 98.3 F (36.8 C)  TempSrc: Oral  SpO2: 98%      General appearance: alert; appears fatigued, but nontoxic, speaking in full sentences and managing own secretions HEENT: NCAT; Ears: EACs clear, TMs pearly gray with visible cone of light, without erythema; Eyes: PERRL, EOMI grossly; Nose: no obvious rhinorrhea; Throat: oropharynx clear, tonsils 1+ and erythematous without white tonsillar exudates, uvula midline Neck: supple without LAD Lungs: CTA bilaterally without adventitious breath sounds; cough absent Heart: regular rate and rhythm.  Radial pulses 2+ symmetrical bilaterally Skin: warm and dry Psychological: alert and cooperative; normal mood and affect  LABS: Results for orders placed or performed during the hospital encounter of 10/22/19 (from the past 24 hour(s))  POCT Rapid Strep A     Status: None   Collection Time: 10/22/19  2:17 PM  Result Value Ref Range   Streptococcus, Group A Screen (Direct) NEGATIVE NEGATIVE     ASSESSMENT & PLAN:  1. Tonsillitis   2. Tachycardia     Meds ordered this encounter  Medications  . cefTRIAXone (ROCEPHIN) injection 1 g  . ketorolac (TORADOL) 30 MG/ML injection 30 mg  . dexamethasone (DECADRON) injection 10 mg  . sulfamethoxazole-trimethoprim (BACTRIM DS) 800-160 MG tablet    Sig: Take 1 tablet by mouth 2 (two) times daily for 7 days.    Dispense:  14 tablet    Refill:  0    Order Specific Question:   Supervising Provider    Answer:   Merrilee Jansky [id number]    Decadron 10mg  IM in office today Toradol 30mg  IM in office today 1g Rocephin in office today Strep test negative, will send out for culture and we will call you with results Declines mono at this time Will treat for tonsitillits Prescribed Bactrim Get plenty of rest and push fluids Take OTC Zyrtec and use chloraseptic spray as needed for throat pain. Drink warm or cool liquids, use throat lozenges, or popsicles to help alleviate symptoms Take OTC ibuprofen or tylenol as needed for pain Follow up with PCP if  symptoms persists Return or go to ER if patient has any new or worsening symptoms such as fever, chills, nausea, vomiting, worsening sore throat, cough, abdominal pain, chest pain, changes in bowel or bladder habits  Reviewed expectations re: course of current medical issues. Questions answered. Outlined signs and symptoms indicating need for more acute intervention. Patient verbalized understanding. After Visit Summary given.          , NP 10/22/19 1433

## 2019-10-22 NOTE — Discharge Instructions (Addendum)
You have been treated in the office today for tonsillitis  You received an injection for pain, a steroid for inflammation, and Rocephin antibiotic  I have sent in Bactrim for you to take twice a day for 7 days  Follow-up with this office or with primary care as needed  The ER for trouble swallowing, trouble breathing, other concerning symptoms

## 2019-10-25 LAB — CULTURE, GROUP A STREP (THRC)

## 2020-03-15 ENCOUNTER — Ambulatory Visit (HOSPITAL_COMMUNITY)
Admission: EM | Admit: 2020-03-15 | Discharge: 2020-03-15 | Disposition: A | Discharge status: Home / Self Care | Payer: Self-pay | Attending: Family Medicine | Admitting: Family Medicine

## 2020-03-15 ENCOUNTER — Other Ambulatory Visit: Payer: Self-pay

## 2020-03-15 ENCOUNTER — Encounter (HOSPITAL_COMMUNITY): Payer: Self-pay | Admitting: Emergency Medicine

## 2020-03-15 DIAGNOSIS — R300 Dysuria: Secondary | ICD-10-CM | POA: Insufficient documentation

## 2020-03-15 DIAGNOSIS — N390 Urinary tract infection, site not specified: Secondary | ICD-10-CM | POA: Insufficient documentation

## 2020-03-15 LAB — POCT URINALYSIS DIPSTICK, ED / UC
Glucose, UA: NEGATIVE mg/dL
Hgb urine dipstick: NEGATIVE
Ketones, ur: 40 mg/dL — AB
Nitrite: NEGATIVE
Protein, ur: 30 mg/dL — AB
Specific Gravity, Urine: 1.025 (ref 1.005–1.030)
Urobilinogen, UA: 1 mg/dL (ref 0.0–1.0)
pH: 6 (ref 5.0–8.0)

## 2020-03-15 LAB — POC URINE PREG, ED: Preg Test, Ur: NEGATIVE

## 2020-03-15 MED ORDER — SULFAMETHOXAZOLE-TRIMETHOPRIM 800-160 MG PO TABS: 1.0000 | ORAL_TABLET | Freq: Two times a day (BID) | ORAL | 0 refills | Status: AC

## 2020-03-15 NOTE — ED Provider Notes (Signed)
[facility]    CSN: [id number] Arrival date & time: 03/15/20  0865      History   Chief Complaint Chief Complaint  Patient presents with  . Dysuria  . Urinary Frequency    HPI Michelle Stephens is a 20 y.o. female.   Here today with 2 week history of dysuria, b/l low back pain, suprapubic pain, vaginal odor. Denies fever, chills, N/V/D. Taking AZO with minimal relief, and initially took some of her sister's abx for a UTI but only took a few days worth and sxs returned. No known exposures to STIs but would like to be checked for one.      Past Medical History:  Diagnosis Date  . ADHD (attention deficit hyperactivity disorder)   . Allergy    seasonal  . Anxiety   . Asthma   . Depression   . Headache(784.0)   . Heart murmur    when a baby  . JRA (juvenile rheumatoid arthritis) (HCC)   . Obesity   . Urinary tract infection   . Vision abnormalities     Patient Active Problem List   Diagnosis Date Noted  . Self-inflicted injury   . PTSD (post-traumatic stress disorder) 02/04/2013  . ADHD (attention deficit hyperactivity disorder), combined type 02/04/2013  . MDD (major depressive disorder), recurrent episode, severe 11/07/2011    Class: Diagnosis of    Past Surgical History:  Procedure Laterality Date  . NO PAST SURGERIES      OB History    Gravida  1   Para      Term      Preterm      AB      Living  0     SAB      IAB      Ectopic      Multiple      Live Births               Home Medications    Prior to Admission medications   Medication Sig Start Date End Date Taking? Authorizing Provider  sulfamethoxazole-trimethoprim (BACTRIM DS) 800-160 MG tablet Take 1 tablet by mouth 2 (two) times daily for 3 days. 03/15/20 03/18/20 Yes Particia Nearing, PA-C  acetaminophen (TYLENOL) 500 MG tablet Take 1,000 mg by mouth every 6 (six) hours as needed for mild pain, moderate pain or headache.    [provider]   fexofenadine (ALLEGRA ALLERGY) 60 MG tablet Take 1 tablet (60 mg total) by mouth 2 (two) times daily. 06/11/19 07/11/19  Couture, Cortni S, PA-C    Family History Family History  Problem Relation Age of Onset  . Asthma Mother   . Diabetes Mother   . Hypertension Mother   . Depression Mother   . Mental illness Mother     Social History Social History   Tobacco Use  . Smoking status: Current Every Day Smoker    Packs/day: 0.25    Types: Cigarettes  . Smokeless tobacco: Former Clinical biochemist  . Vaping Use: Every day  . Substances: Nicotine, Flavoring  Substance Use Topics  . Alcohol use: Not Currently  . Drug use: Yes    Types: Marijuana     Allergies   Coconut flavor and Penicillins   Review of Systems Review of Systems PER HPI    Physical Exam Triage Vital Signs ED Triage Vitals  Enc Vitals Group     BP 03/15/20 0943 132/88     Pulse Rate 03/15/20 0943 Marland Kitchen)  106     Resp 03/15/20 0943 18     Temp 03/15/20 0943 98.3 F (36.8 C)     Temp Source 03/15/20 0943 Oral     SpO2 03/15/20 0943 97 %     Weight --      Height --      Head Circumference --      Peak Flow --      Pain Score 03/15/20 0941 8     Pain Loc --      Pain Edu? --      Excl. in GC? --    No data found.  Updated Vital Signs BP 132/88 (BP Location: Left Arm)   Pulse (!) 106   Temp 98.3 F (36.8 C) (Oral)   Resp 18   LMP 02/17/2020   SpO2 97%   Visual Acuity Right Eye Distance:   Left Eye Distance:   Bilateral Distance:    Right Eye Near:   Left Eye Near:    Bilateral Near:     Physical Exam Vitals and nursing note reviewed.  Constitutional:      Appearance: Normal appearance. She is not ill-appearing.  HENT:     Head: Atraumatic.     Right Ear: Tympanic membrane normal.     Left Ear: Tympanic membrane normal.     Nose: Nose normal.     Mouth/Throat:     Mouth: Mucous membranes are moist.     Pharynx: Oropharynx is clear.  Eyes:     Extraocular Movements:  Extraocular movements intact.     Conjunctiva/sclera: Conjunctivae normal.  Cardiovascular:     Rate and Rhythm: Normal rate and regular rhythm.     Heart sounds: Normal heart sounds.  Pulmonary:     Effort: Pulmonary effort is normal.     Breath sounds: Normal breath sounds.  Abdominal:     General: Bowel sounds are normal. There is no distension.     Palpations: Abdomen is soft.     Tenderness: There is abdominal tenderness (mild suprapubic ttp). There is no right CVA tenderness, left CVA tenderness or guarding.  Musculoskeletal:        General: Normal range of motion.     Cervical back: Normal range of motion and neck supple.  Skin:    General: Skin is warm and dry.  Neurological:     Mental Status: She is alert and oriented to person, place, and time.  Psychiatric:        Mood and Affect: Mood normal.        Thought Content: Thought content normal.        Judgment: Judgment normal.      UC Treatments / Results  Labs (all labs ordered are listed, but only abnormal results are displayed) Labs Reviewed  POCT URINALYSIS DIPSTICK, ED / UC - Abnormal; Notable for the following components:      Result Value   Bilirubin Urine SMALL (*)    Ketones, ur 40 (*)    Protein, ur 30 (*)    Leukocytes,Ua TRACE (*)    All other components within normal limits  URINE CULTURE  POC URINE PREG, ED  CERVICOVAGINAL ANCILLARY ONLY    EKG   Radiology No results found.  Procedures Procedures (including critical care time)  Medications Ordered in UC Medications - No data to display  Initial Impression / Assessment and Plan / UC Course  I have reviewed the triage vital signs and the nursing notes.  Pertinent labs &  imaging results that were available during my care of the patient were reviewed by me and considered in my medical decision making (see chart for details).     Exam overall reassuring, vital signs reassuring aside from mild tachycardia. U/A with possible UTI, urine  culture and aptima swab pending. Will treat with bactrim while awaiting remainder of results. Supportive home care reviewed. Return precautions given for acutely worsening sxs.   Final Clinical Impressions(s) / UC Diagnoses   Final diagnoses:  Lower urinary tract infectious disease  Dysuria   Discharge Instructions   None    ED Prescriptions    Medication Sig Dispense Auth. Provider   sulfamethoxazole-trimethoprim (BACTRIM DS) 800-160 MG tablet Take 1 tablet by mouth 2 (two) times daily for 3 days. 6 tablet Particia Nearing, [place]     PDMP not reviewed this encounter.   Particia Nearing, [place] 03/15/20 1346

## 2020-03-15 NOTE — ED Triage Notes (Signed)
Pt presents with dysuria and frequency xs 2 weeks. States has use OTC treatment and left over antibiotice but no relief.

## 2020-03-16 LAB — CERVICOVAGINAL ANCILLARY ONLY
Bacterial Vaginitis (gardnerella): POSITIVE — AB
Candida Glabrata: NEGATIVE
Candida Vaginitis: NEGATIVE
Chlamydia: NEGATIVE
Comment: NEGATIVE
Comment: NEGATIVE
Comment: NEGATIVE
Comment: NEGATIVE
Comment: NEGATIVE
Comment: NORMAL
Neisseria Gonorrhea: NEGATIVE
Trichomonas: NEGATIVE

## 2020-03-16 LAB — URINE CULTURE: Culture: <10000 — AB

## 2020-03-17 ENCOUNTER — Telehealth (HOSPITAL_COMMUNITY): Payer: Self-pay | Admitting: Emergency Medicine

## 2020-03-17 MED ORDER — METRONIDAZOLE 500 MG PO TABS: 500.0000 mg | ORAL_TABLET | Freq: Two times a day (BID) | ORAL | 0 refills | Status: DC

## 2020-06-14 ENCOUNTER — Ambulatory Visit (HOSPITAL_COMMUNITY)
Admission: EM | Admit: 2020-06-14 | Discharge: 2020-06-14 | Disposition: A | Discharge status: Home / Self Care | Payer: Self-pay | Attending: Emergency Medicine | Admitting: Emergency Medicine

## 2020-06-14 ENCOUNTER — Encounter (HOSPITAL_COMMUNITY): Payer: Self-pay | Admitting: *Deleted

## 2020-06-14 ENCOUNTER — Other Ambulatory Visit: Payer: Self-pay

## 2020-06-14 DIAGNOSIS — A549 Gonococcal infection, unspecified: Secondary | ICD-10-CM

## 2020-06-14 DIAGNOSIS — R300 Dysuria: Secondary | ICD-10-CM

## 2020-06-14 DIAGNOSIS — N939 Abnormal uterine and vaginal bleeding, unspecified: Secondary | ICD-10-CM

## 2020-06-14 LAB — POCT URINALYSIS DIPSTICK, ED / UC
Bilirubin Urine: NEGATIVE
Glucose, UA: NEGATIVE mg/dL
Ketones, ur: NEGATIVE mg/dL
Leukocytes,Ua: NEGATIVE
Nitrite: NEGATIVE
Protein, ur: NEGATIVE mg/dL
Specific Gravity, Urine: >=1.03 (ref 1.005–1.030)
Urobilinogen, UA: 0.2 mg/dL (ref 0.0–1.0)
pH: 6 (ref 5.0–8.0)

## 2020-06-14 MED ORDER — CEFTRIAXONE SODIUM 1 G IJ SOLR
INTRAMUSCULAR | Status: AC
  Filled 2020-06-14: qty 10

## 2020-06-14 MED ORDER — LIDOCAINE HCL (PF) 1 % IJ SOLN
INTRAMUSCULAR | Status: AC
  Filled 2020-06-14: qty 2

## 2020-06-14 MED ORDER — HYDROXYZINE HCL 25 MG PO TABS: 25.0000 mg | ORAL_TABLET | Freq: Four times a day (QID) | ORAL | 0 refills | Status: DC

## 2020-06-14 MED ORDER — CEFTRIAXONE SODIUM 1 G IJ SOLR
1.0000 g | Freq: Once | INTRAMUSCULAR | Status: AC
  Administered 2020-06-14: 1 g via INTRAMUSCULAR

## 2020-06-14 NOTE — ED Provider Notes (Signed)
[facility]    CSN: [id number] Arrival date & time: 06/14/20  0827      History   Chief Complaint Chief Complaint  Patient presents with  . Vaginal Discharge  . Urinary Tract Infection  . Vaginal Bleeding    HPI Michelle Stephens is a 20 y.o. female.   Patient here for evaluation of abnormal vaginal bleeding and positive STI screen.  Patient was seen and evaluated at another healthcare facility on 4/16.  At that time her pregnancy test was negative.  STI screen was positive for gonorrhea.  Patient denies any treatment for gonorrhea and there is no documentation of any treatment.  She also reports concern about a possible UTI and unable to find any urinalysis results. Denies any specific alleviating or aggravating factors.  Denies any fevers, chest pain, shortness of breath, N/V/D, numbness, tingling, weakness, abdominal pain, or headaches.   ROS: As per HPI, all other pertinent ROS negative   The history is provided by the patient.  Vaginal Discharge Urinary Tract Infection Associated symptoms: vaginal discharge   Vaginal Bleeding Associated symptoms: vaginal discharge     Past Medical History:  Diagnosis Date  . ADHD (attention deficit hyperactivity disorder)   . Allergy    seasonal  . Anxiety   . Asthma   . Depression   . Headache(784.0)   . Heart murmur    when a baby  . JRA (juvenile rheumatoid arthritis) (HCC)   . Obesity   . Urinary tract infection   . Vision abnormalities     Patient Active Problem List   Diagnosis Date Noted  . Self-inflicted injury   . PTSD (post-traumatic stress disorder) 02/04/2013  . ADHD (attention deficit hyperactivity disorder), combined type 02/04/2013  . MDD (major depressive disorder), recurrent episode, severe 11/07/2011    Class: Diagnosis of    Past Surgical History:  Procedure Laterality Date  . NO PAST SURGERIES      OB History    Gravida  1   Para      Term      Preterm      AB      Living   0     SAB      IAB      Ectopic      Multiple      Live Births               Home Medications    Prior to Admission medications   Medication Sig Start Date End Date Taking? Authorizing Provider  hydrOXYzine (ATARAX/VISTARIL) 25 MG tablet Take 1 tablet (25 mg total) by mouth every 6 (six) hours. 06/14/20  Yes Ivette Loyal, NP  acetaminophen (TYLENOL) 500 MG tablet Take 1,000 mg by mouth every 6 (six) hours as needed for mild pain, moderate pain or headache.    [provider]  fexofenadine (ALLEGRA ALLERGY) 60 MG tablet Take 1 tablet (60 mg total) by mouth 2 (two) times daily. 06/11/19 07/11/19  Couture, Cortni S, PA-C  metroNIDAZOLE (FLAGYL) 500 MG tablet Take 1 tablet (500 mg total) by mouth 2 (two) times daily. 03/17/20   LampteyBritta Mccreedy, MD    Family History Family History  Problem Relation Age of Onset  . Asthma Mother   . Diabetes Mother   . Hypertension Mother   . Depression Mother   . Mental illness Mother     Social History Social History   Tobacco Use  . Smoking status: Current Every Day  Smoker    Packs/day: 0.25    Types: Cigarettes  . Smokeless tobacco: Former Clinical biochemist  . Vaping Use: Every day  . Substances: Nicotine, Flavoring  Substance Use Topics  . Alcohol use: Not Currently  . Drug use: Yes    Types: Marijuana     Allergies   Coconut flavor and Penicillins   Review of Systems Review of Systems  Genitourinary: Positive for vaginal bleeding and vaginal discharge.  All other systems reviewed and are negative.    Physical Exam Triage Vital Signs ED Triage Vitals  Enc Vitals Group     BP 06/14/20 0855 (!) 143/84     Pulse Rate 06/14/20 0855 75     Resp 06/14/20 0855 18     Temp 06/14/20 0855 98.6 F (37 C)     Temp Source 06/14/20 0855 Oral     SpO2 06/14/20 0855 99 %     Weight --      Height --      Head Circumference --      Peak Flow --      Pain Score 06/14/20 0852 8     Pain Loc --      Pain Edu?  --      Excl. in GC? --    No data found.  Updated Vital Signs BP (!) 143/84 (BP Location: Right Arm)   Pulse 75   Temp 98.6 F (37 C) (Oral)   Resp 18   LMP 06/14/2020   SpO2 99%   Visual Acuity Right Eye Distance:   Left Eye Distance:   Bilateral Distance:    Right Eye Near:   Left Eye Near:    Bilateral Near:     Physical Exam Vitals and nursing note reviewed.  Constitutional:      General: She is not in acute distress.    Appearance: Normal appearance. She is not ill-appearing, toxic-appearing or diaphoretic.  HENT:     Head: Normocephalic and atraumatic.  Eyes:     Conjunctiva/sclera: Conjunctivae normal.  Cardiovascular:     Rate and Rhythm: Normal rate.     Pulses: Normal pulses.  Pulmonary:     Effort: Pulmonary effort is normal.  Abdominal:     General: Abdomen is flat.  Genitourinary:    Comments: declines Musculoskeletal:        General: Normal range of motion.     Cervical back: Normal range of motion.  Skin:    General: Skin is warm and dry.  Neurological:     General: No focal deficit present.     Mental Status: She is alert and oriented to person, place, and time.  Psychiatric:        Mood and Affect: Mood normal.      UC Treatments / Results  Labs (all labs ordered are listed, but only abnormal results are displayed) Labs Reviewed  POCT URINALYSIS DIPSTICK, ED / UC - Abnormal; Notable for the following components:      Result Value   Hgb urine dipstick LARGE (*)    All other components within normal limits    EKG   Radiology No results found.  Procedures Procedures (including critical care time)  Medications Ordered in UC Medications  cefTRIAXone (ROCEPHIN) injection 1 g (1 g Intramuscular Given 06/14/20 0923)    Initial Impression / Assessment and Plan / UC Course  I have reviewed the triage vital signs and the nursing notes.  Pertinent labs & imaging results that were  available during my care of the patient were  reviewed by me and considered in my medical decision making (see chart for details).     Assessment with any red flags or concerns.  Lab work at outside facility was primarily unremarkable other than the positive gonorrhea.  We will treat with Rocephin 1 g IM as her weight is greater than 300 pounds.  Patient does report a childhood history of penicillin allergy.  Patient reports she believes she developed a rash.  We will proceed with IM Rocephin and will prescribe Atarax as needed for itching.  Patient instructed to go to the emergency room for any shortness of breath or difficulty breathing.  Discussed safe sex practices including condom or other barrier method use. Urinalysis positive for hemoglobin but otherwise unremarkable.  Patient reports concerned that bleeding is from a possible miscarriage.  Pregnancy test at outside facility was negative.  Hemoglobin was also within normal range.  Patient instructed to follow-up with primary care or GYN for any additional concerns about dysmenorrhea.  Final Clinical Impressions(s) / UC Diagnoses   Final diagnoses:  Vaginal bleeding  Gonorrhea  Dysuria     Discharge Instructions     You urine does not show any signs of infection.    You have given rocephin IM in the office today. If you develop a rash or itching, you can take the Atarax every 6 hours as needed.  If you develop any shortness of breath or difficulty breathing, go straight to the ED.   Do not have sex while taking undergoing treatment for STI.  Make sure that all of your partners get tested and treated.   Use a condom or other barrier method for all sexual encounters.    Return or go to the Emergency Department if symptoms worsen or do not improve in the next few days.      ED Prescriptions    Medication Sig Dispense Auth. Provider   hydrOXYzine (ATARAX/VISTARIL) 25 MG tablet Take 1 tablet (25 mg total) by mouth every 6 (six) hours. 20 tablet Ivette Loyal, NP     PDMP  not reviewed this encounter.   Ivette Loyal, NP 06/14/20 [phone]

## 2020-06-14 NOTE — ED Triage Notes (Signed)
Pt reports she was tested at [facility] system and had a positive test result on 06-10-20 for SDT. Pt needs Anti-bx. Pt may also have a UTI.

## 2020-06-14 NOTE — Discharge Instructions (Signed)
You urine does not show any signs of infection.    You have given rocephin IM in the office today. If you develop a rash or itching, you can take the Atarax every 6 hours as needed.  If you develop any shortness of breath or difficulty breathing, go straight to the ED.   Do not have sex while taking undergoing treatment for STI.  Make sure that all of your partners get tested and treated.   Use a condom or other barrier method for all sexual encounters.    Return or go to the Emergency Department if symptoms worsen or do not improve in the next few days.

## 2020-06-14 NOTE — ED Triage Notes (Signed)
PT has results of UA and STD swab on her MY chart from [facility] system.

## 2020-10-19 ENCOUNTER — Encounter (HOSPITAL_COMMUNITY): Payer: Self-pay | Admitting: Emergency Medicine

## 2020-10-19 ENCOUNTER — Ambulatory Visit (HOSPITAL_COMMUNITY)
Admission: EM | Admit: 2020-10-19 | Discharge: 2020-10-19 | Disposition: A | Discharge status: Home / Self Care | Payer: Self-pay | Attending: Emergency Medicine | Admitting: Emergency Medicine

## 2020-10-19 ENCOUNTER — Other Ambulatory Visit: Payer: Self-pay

## 2020-10-19 DIAGNOSIS — R339 Retention of urine, unspecified: Secondary | ICD-10-CM | POA: Insufficient documentation

## 2020-10-19 LAB — POCT URINALYSIS DIPSTICK, ED / UC
Glucose, UA: NEGATIVE mg/dL
Hgb urine dipstick: NEGATIVE
Leukocytes,Ua: NEGATIVE
Nitrite: NEGATIVE
Protein, ur: NEGATIVE mg/dL
Specific Gravity, Urine: 1.02 (ref 1.005–1.030)
Urobilinogen, UA: 2 mg/dL — ABNORMAL HIGH (ref 0.0–1.0)
pH: 7 (ref 5.0–8.0)

## 2020-10-19 LAB — POC URINE PREG, ED: Preg Test, Ur: NEGATIVE

## 2020-10-19 MED ORDER — CEFTRIAXONE SODIUM 500 MG IJ SOLR
500.0000 mg | Freq: Once | INTRAMUSCULAR | Status: AC
  Administered 2020-10-19: 500 mg via INTRAMUSCULAR

## 2020-10-19 MED ORDER — CEFTRIAXONE SODIUM 500 MG IJ SOLR
INTRAMUSCULAR | Status: AC
  Filled 2020-10-19: qty 500

## 2020-10-19 MED ORDER — LIDOCAINE HCL (PF) 1 % IJ SOLN
INTRAMUSCULAR | Status: AC
  Filled 2020-10-19: qty 2

## 2020-10-19 NOTE — Discharge Instructions (Addendum)
Your urinalysis and pregnancy are negative   Labs pending 2-3 days, you will be contacted if positive for any sti and treatment will be sent to the pharmacy, you will have to return to the clinic if positive for gonorrhea to receive treatment   Please refrain from having sex until labs results, if positive please refrain from having sex until treatment complete and symptoms resolve   If positive for Chlamydia  gonorrhea or trichomoniasis please notify partner or partners so they may tested as well  Moving forward, it is recommended you use some form of protection against the transmission of sti infections  such as condoms or dental dams with each sexual encounter

## 2020-10-19 NOTE — ED Triage Notes (Signed)
Pt is present today for STD exposure. Pt was exposed to std 08/15

## 2020-10-19 NOTE — ED Provider Notes (Addendum)
[facility]    CSN: [id number] Arrival date & time: 10/19/20  1304      History   Chief Complaint No chief complaint on file.   HPI Michelle Stephens is a 20 y.o. female.   Patient presents with incomplete bladder emptying for 1 month.  Denies dysuria, urgency, frequency, hematuria, abdominal pain, flank pain, fever, chills, discharge, itching,, new rash or lesions.  Sexually active, 3 partners within the last 6 months, sometimes condom use.  Known exposure to gonorrhea.  Past Medical History:  Diagnosis Date   ADHD (attention deficit hyperactivity disorder)    Allergy    seasonal   Anxiety    Asthma    Depression    Headache(784.0)    Heart murmur    when a baby   JRA (juvenile rheumatoid arthritis) (HCC)    Obesity    Urinary tract infection    Vision abnormalities     Patient Active Problem List   Diagnosis Date Noted   Self-inflicted injury    PTSD (post-traumatic stress disorder) 02/04/2013   ADHD (attention deficit hyperactivity disorder), combined type 02/04/2013   MDD (major depressive disorder), recurrent episode, severe 11/07/2011    Class: Diagnosis of    Past Surgical History:  Procedure Laterality Date   NO PAST SURGERIES      OB History     Gravida  1   Para      Term      Preterm      AB      Living  0      SAB      IAB      Ectopic      Multiple      Live Births               Home Medications    Prior to Admission medications   Medication Sig Start Date End Date Taking? Authorizing Provider  acetaminophen (TYLENOL) 500 MG tablet Take 1,000 mg by mouth every 6 (six) hours as needed for mild pain, moderate pain or headache.    [provider]  fexofenadine (ALLEGRA ALLERGY) 60 MG tablet Take 1 tablet (60 mg total) by mouth 2 (two) times daily. 06/11/19 07/11/19  Couture, Cortni S, PA-C  hydrOXYzine (ATARAX/VISTARIL) 25 MG tablet Take 1 tablet (25 mg total) by mouth every 6 (six) hours. 06/14/20    Ivette Loyal, NP  metroNIDAZOLE (FLAGYL) 500 MG tablet Take 1 tablet (500 mg total) by mouth 2 (two) times daily. 03/17/20   Lamptey, Britta Mccreedy, MD    Family History Family History  Problem Relation Age of Onset   Asthma Mother    Diabetes Mother    Hypertension Mother    Depression Mother    Mental illness Mother     Social History Social History   Tobacco Use   Smoking status: Every Day    Packs/day: 0.25    Types: Cigarettes   Smokeless tobacco: Former  Building services engineer Use: Every day   Substances: Nicotine, Flavoring  Substance Use Topics   Alcohol use: Not Currently   Drug use: Yes    Types: Marijuana     Allergies   Coconut flavor and Penicillins   Review of Systems Review of Systems  Constitutional: Negative.   Respiratory: Negative.    Genitourinary: Negative.   Skin: Negative.     Physical Exam Triage Vital Signs ED Triage Vitals [10/19/20 1334]  Enc Vitals Group  BP (!) 141/89     Pulse Rate 87     Resp      Temp 98.5 F (36.9 C)     Temp Source Oral     SpO2 100 %     Weight      Height      Head Circumference      Peak Flow      Pain Score      Pain Loc      Pain Edu?      Excl. in GC?    No data found.  Updated Vital Signs BP (!) 141/89 (BP Location: Right Arm)   Pulse 87   Temp 98.5 F (36.9 C) (Oral)   SpO2 100%   Breastfeeding No   Visual Acuity Right Eye Distance:   Left Eye Distance:   Bilateral Distance:    Right Eye Near:   Left Eye Near:    Bilateral Near:     Physical Exam Constitutional:      Appearance: Normal appearance.  HENT:     Head: Normocephalic.  Eyes:     Extraocular Movements: Extraocular movements intact.  Abdominal:     General: Abdomen is flat. Bowel sounds are normal.     Palpations: Abdomen is soft.     Tenderness: There is no abdominal tenderness. There is no right CVA tenderness or left CVA tenderness.  Genitourinary:    Comments: Deferred self collected vaginal swab Skin:     General: Skin is warm and dry.  Neurological:     Mental Status: She is alert and oriented to person, place, and time. Mental status is at baseline.  Psychiatric:        Mood and Affect: Mood normal.        Behavior: Behavior normal.     UC Treatments / Results  Labs (all labs ordered are listed, but only abnormal results are displayed) Labs Reviewed  POCT URINALYSIS DIPSTICK, ED / UC - Abnormal; Notable for the following components:      Result Value   Bilirubin Urine SMALL (*)    Ketones, ur TRACE (*)    Urobilinogen, UA 2.0 (*)    All other components within normal limits  RPR  HIV ANTIBODY (ROUTINE TESTING W REFLEX)  POC URINE PREG, ED  CERVICOVAGINAL ANCILLARY ONLY    EKG   Radiology No results found.  Procedures Procedures (including critical care time)  Medications Ordered in UC Medications  cefTRIAXone (ROCEPHIN) injection 500 mg (has no administration in time range)    Initial Impression / Assessment and Plan / UC Course  I have reviewed the triage vital signs and the nursing notes.  Pertinent labs & imaging results that were available during my care of the patient were reviewed by me and considered in my medical decision making (see chart for details).  Incomplete bladder emptying  1.  Ceftriaxone 500 mg IM now, will treat prophylactically for gonorrhea 2.  STI screening pending, will treat per protocol, advised abstinence until labs results, advised use of condoms moving forward, patient did request condoms to take home time, given information for [facility] department where condoms are dispensed freely, attempted three times to get blood work for HIV and syphilis testing,unsuccessful  3.  Urinalysis negative 4.  Urine pregnancy negative Final Clinical Impressions(s) / UC Diagnoses   Final diagnoses:  Incomplete bladder emptying     Discharge Instructions      Your urinalysis and pregnancy are negative   Labs  pending 2-3 days, you  will be contacted if positive for any sti and treatment will be sent to the pharmacy, you will have to return to the clinic if positive for gonorrhea to receive treatment   Please refrain from having sex until labs results, if positive please refrain from having sex until treatment complete and symptoms resolve   If positive for Chlamydia  gonorrhea or trichomoniasis please notify partner or partners so they may tested as well  Moving forward, it is recommended you use some form of protection against the transmission of sti infections  such as condoms or dental dams with each sexual encounter     ED Prescriptions   None    PDMP not reviewed this encounter.   Valinda Hoar, NP 10/19/20 1415    Valinda Hoar, NP 10/19/20 1443

## 2020-10-20 LAB — CERVICOVAGINAL ANCILLARY ONLY
Bacterial Vaginitis (gardnerella): POSITIVE — AB
Candida Glabrata: NEGATIVE
Candida Vaginitis: NEGATIVE
Chlamydia: NEGATIVE
Comment: NEGATIVE
Comment: NEGATIVE
Comment: NEGATIVE
Comment: NEGATIVE
Comment: NEGATIVE
Comment: NORMAL
Neisseria Gonorrhea: NEGATIVE
Trichomonas: NEGATIVE

## 2020-10-23 ENCOUNTER — Telehealth (HOSPITAL_COMMUNITY): Payer: Self-pay | Admitting: Emergency Medicine

## 2020-10-23 MED ORDER — METRONIDAZOLE 500 MG PO TABS: 500.0000 mg | ORAL_TABLET | Freq: Two times a day (BID) | ORAL | 0 refills | Status: DC

## 2020-10-27 ENCOUNTER — Other Ambulatory Visit: Payer: Self-pay

## 2020-10-27 ENCOUNTER — Ambulatory Visit (HOSPITAL_COMMUNITY)
Admission: EM | Admit: 2020-10-27 | Discharge: 2020-10-27 | Disposition: A | Discharge status: Home / Self Care | Payer: Self-pay | Attending: Family Medicine | Admitting: Family Medicine

## 2020-10-27 ENCOUNTER — Encounter (HOSPITAL_COMMUNITY): Payer: Self-pay

## 2020-10-27 DIAGNOSIS — R1013 Epigastric pain: Secondary | ICD-10-CM

## 2020-10-27 DIAGNOSIS — R112 Nausea with vomiting, unspecified: Secondary | ICD-10-CM

## 2020-10-27 LAB — POCT URINALYSIS DIPSTICK, ED / UC
Glucose, UA: NEGATIVE mg/dL
Ketones, ur: 15 mg/dL — AB
Leukocytes,Ua: NEGATIVE
Nitrite: NEGATIVE
Protein, ur: 30 mg/dL — AB
Specific Gravity, Urine: 1.02 (ref 1.005–1.030)
Urobilinogen, UA: 1 mg/dL (ref 0.0–1.0)
pH: 7 (ref 5.0–8.0)

## 2020-10-27 LAB — POC URINE PREG, ED: Preg Test, Ur: NEGATIVE

## 2020-10-27 MED ORDER — LIDOCAINE VISCOUS HCL 2 % MT SOLN
15.0000 mL | Freq: Once | OROMUCOSAL | Status: AC
  Administered 2020-10-27: 15 mL via ORAL

## 2020-10-27 MED ORDER — ONDANSETRON 4 MG PO TBDP
ORAL_TABLET | ORAL | Status: AC
  Filled 2020-10-27: qty 1

## 2020-10-27 MED ORDER — ALUM & MAG HYDROXIDE-SIMETH 200-200-20 MG/5ML PO SUSP
ORAL | Status: AC
  Filled 2020-10-27: qty 30

## 2020-10-27 MED ORDER — LIDOCAINE VISCOUS HCL 2 % MT SOLN
OROMUCOSAL | Status: AC
  Filled 2020-10-27: qty 15

## 2020-10-27 MED ORDER — ONDANSETRON 4 MG PO TBDP: 4.0000 mg | ORAL_TABLET | Freq: Three times a day (TID) | ORAL | 0 refills | Status: DC | PRN

## 2020-10-27 MED ORDER — PANTOPRAZOLE SODIUM 40 MG PO TBEC: 40.0000 mg | DELAYED_RELEASE_TABLET | Freq: Every day | ORAL | 1 refills | Status: DC

## 2020-10-27 MED ORDER — SUCRALFATE 1 G PO TABS: 1.0000 g | ORAL_TABLET | Freq: Three times a day (TID) | ORAL | 0 refills | Status: DC | PRN

## 2020-10-27 MED ORDER — ONDANSETRON 4 MG PO TBDP
4.0000 mg | ORAL_TABLET | Freq: Once | ORAL | Status: AC
  Administered 2020-10-27: 4 mg via ORAL

## 2020-10-27 MED ORDER — ALUM & MAG HYDROXIDE-SIMETH 200-200-20 MG/5ML PO SUSP
30.0000 mL | Freq: Once | ORAL | Status: AC
  Administered 2020-10-27: 30 mL via ORAL

## 2020-10-27 NOTE — ED Provider Notes (Signed)
[facility]    CSN: [id number] Arrival date & time: 10/27/20  1235      History   Chief Complaint Chief Complaint  Patient presents with   Abdominal Pain   Emesis    HPI Michelle Stephens is a 20 y.o. female.   Patient presenting today with acute on chronic epigastric pain and burning, belching, nausea, vomiting.  She states this happens frequently when she eats something spicy which she did yesterday.  She states she is currently unable to keep down anything, even water.  Has been trying to take her acid reducers that she gets over-the-counter for this which usually helps but has not been able to keep them down.  She denies fever, chills, bowel changes, dysuria, hematuria, sick contacts.   Past Medical History:  Diagnosis Date   ADHD (attention deficit hyperactivity disorder)    Allergy    seasonal   Anxiety    Asthma    Depression    Headache(784.0)    Heart murmur    when a baby   JRA (juvenile rheumatoid arthritis) (HCC)    Obesity    Urinary tract infection    Vision abnormalities     Patient Active Problem List   Diagnosis Date Noted   Self-inflicted injury    PTSD (post-traumatic stress disorder) 02/04/2013   ADHD (attention deficit hyperactivity disorder), combined type 02/04/2013   MDD (major depressive disorder), recurrent episode, severe 11/07/2011    Class: Diagnosis of    Past Surgical History:  Procedure Laterality Date   NO PAST SURGERIES      OB History     Gravida  1   Para      Term      Preterm      AB      Living  0      SAB      IAB      Ectopic      Multiple      Live Births               Home Medications    Prior to Admission medications   Medication Sig Start Date End Date Taking? Authorizing Provider  ondansetron (ZOFRAN ODT) 4 MG disintegrating tablet Take 1 tablet (4 mg total) by mouth every 8 (eight) hours as needed for nausea or vomiting. 10/27/20  Yes Particia Nearing, PA-C   pantoprazole (PROTONIX) 40 MG tablet Take 1 tablet (40 mg total) by mouth daily. 10/27/20  Yes Particia Nearing, PA-C  sucralfate (CARAFATE) 1 g tablet Take 1 tablet (1 g total) by mouth 3 (three) times daily as needed. May dissolve 1 tablet into a glass of water and drink. 10/27/20  Yes Particia Nearing, PA-C  acetaminophen (TYLENOL) 500 MG tablet Take 1,000 mg by mouth every 6 (six) hours as needed for mild pain, moderate pain or headache.    [provider]  fexofenadine (ALLEGRA ALLERGY) 60 MG tablet Take 1 tablet (60 mg total) by mouth 2 (two) times daily. 06/11/19 07/11/19  Couture, Cortni S, PA-C  hydrOXYzine (ATARAX/VISTARIL) 25 MG tablet Take 1 tablet (25 mg total) by mouth every 6 (six) hours. 06/14/20   Ivette Loyal, NP  metroNIDAZOLE (FLAGYL) 500 MG tablet Take 1 tablet (500 mg total) by mouth 2 (two) times daily. 10/23/20   LampteyBritta Mccreedy, MD    Family History Family History  Problem Relation Age of Onset   Asthma Mother    Diabetes Mother  Hypertension Mother    Depression Mother    Mental illness Mother     Social History Social History   Tobacco Use   Smoking status: Every Day    Packs/day: 0.25    Types: Cigarettes   Smokeless tobacco: Former  Building services engineer Use: Every day   Substances: Nicotine, Flavoring  Substance Use Topics   Alcohol use: Not Currently   Drug use: Yes    Types: Marijuana     Allergies   Coconut flavor and Penicillins   Review of Systems Review of Systems Per HPI  Physical Exam Triage Vital Signs ED Triage Vitals  Enc Vitals Group     BP 10/27/20 1251 133/85     Pulse Rate 10/27/20 1251 85     Resp 10/27/20 1251 18     Temp 10/27/20 1251 98.9 F (37.2 C)     Temp Source 10/27/20 1251 Oral     SpO2 10/27/20 1251 96 %     Weight --      Height --      Head Circumference --      Peak Flow --      Pain Score 10/27/20 1250 9     Pain Loc --      Pain Edu? --      Excl. in GC? --    No data  found.  Updated Vital Signs BP 133/85 (BP Location: Right Arm)   Pulse 85   Temp 98.9 F (37.2 C) (Oral)   Resp 18   LMP  (Within Days)   SpO2 96%   Visual Acuity Right Eye Distance:   Left Eye Distance:   Bilateral Distance:    Right Eye Near:   Left Eye Near:    Bilateral Near:     Physical Exam Vitals and nursing note reviewed.  Constitutional:      Appearance: Normal appearance. She is not ill-appearing.  HENT:     Head: Atraumatic.     Mouth/Throat:     Mouth: Mucous membranes are moist.  Eyes:     Extraocular Movements: Extraocular movements intact.     Conjunctiva/sclera: Conjunctivae normal.  Cardiovascular:     Rate and Rhythm: Normal rate and regular rhythm.     Heart sounds: Normal heart sounds.  Pulmonary:     Effort: Pulmonary effort is normal.     Breath sounds: Normal breath sounds.  Abdominal:     General: Bowel sounds are normal. There is no distension.     Palpations: Abdomen is soft.     Tenderness: There is abdominal tenderness. There is no right CVA tenderness, left CVA tenderness or guarding.     Comments: Epigastric tenderness palpation without distention or guarding  Musculoskeletal:        General: Normal range of motion.     Cervical back: Normal range of motion and neck supple.  Skin:    General: Skin is warm and dry.  Neurological:     Mental Status: She is alert and oriented to person, place, and time.  Psychiatric:        Mood and Affect: Mood normal.        Thought Content: Thought content normal.        Judgment: Judgment normal.     UC Treatments / Results  Labs (all labs ordered are listed, but only abnormal results are displayed) Labs Reviewed  POCT URINALYSIS DIPSTICK, ED / UC - Abnormal; Notable for the following components:  Result Value   Bilirubin Urine SMALL (*)    Ketones, ur 15 (*)    Hgb urine dipstick LARGE (*)    Protein, ur 30 (*)    All other components within normal limits  POC URINE PREG, ED     EKG   Radiology No results found.  Procedures Procedures (including critical care time)  Medications Ordered in UC Medications  alum & mag hydroxide-simeth (MAALOX/MYLANTA) 200-200-20 MG/5ML suspension 30 mL (30 mLs Oral Given 10/27/20 1320)    And  lidocaine (XYLOCAINE) 2 % viscous mouth solution 15 mL (15 mLs Oral Given 10/27/20 1320)  ondansetron (ZOFRAN-ODT) disintegrating tablet 4 mg (4 mg Oral Given 10/27/20 1320)    Initial Impression / Assessment and Plan / UC Course  I have reviewed the triage vital signs and the nursing notes.  Pertinent labs & imaging results that were available during my care of the patient were reviewed by me and considered in my medical decision making (see chart for details).     Vital signs and exam overall reassuring, given recurrence, quality of symptoms suspect gastritis.  UA without evidence of urinary tract infection, urine pregnancy negative.  Patient requesting GI cocktail as this typically provides good benefit, we will also give a dose of Zofran in clinic.  Start Protonix daily, Carafate as needed and Zofran as needed.  Work note given.  Push fluids, brat diet.  Return for acutely worsening symptoms.  Final Clinical Impressions(s) / UC Diagnoses   Final diagnoses:  Epigastric pain  Intractable vomiting with nausea, unspecified vomiting type   Discharge Instructions   None    ED Prescriptions     Medication Sig Dispense Auth. Provider   ondansetron (ZOFRAN ODT) 4 MG disintegrating tablet Take 1 tablet (4 mg total) by mouth every 8 (eight) hours as needed for nausea or vomiting. 20 tablet Particia Nearing, PA-C   sucralfate (CARAFATE) 1 g tablet Take 1 tablet (1 g total) by mouth 3 (three) times daily as needed. May dissolve 1 tablet into a glass of water and drink. 60 tablet Particia Nearing, [place]   pantoprazole (PROTONIX) 40 MG tablet Take 1 tablet (40 mg total) by mouth daily. 30 tablet Particia Nearing, [place]       PDMP not reviewed this encounter.   Particia Nearing, [place] 10/27/20 1323

## 2020-10-27 NOTE — ED Triage Notes (Signed)
Pt reports abdominal cramps last 5-10 mins and vomiting since last night.

## 2021-02-21 ENCOUNTER — Encounter (HOSPITAL_COMMUNITY): Payer: Self-pay

## 2021-02-21 ENCOUNTER — Ambulatory Visit (HOSPITAL_COMMUNITY)
Admission: EM | Admit: 2021-02-21 | Discharge: 2021-02-21 | Disposition: A | Discharge status: Home / Self Care | Payer: Self-pay | Attending: Internal Medicine | Admitting: Internal Medicine

## 2021-02-21 ENCOUNTER — Other Ambulatory Visit: Payer: Self-pay

## 2021-02-21 DIAGNOSIS — K2950 Unspecified chronic gastritis without bleeding: Secondary | ICD-10-CM

## 2021-02-21 MED ORDER — ONDANSETRON 4 MG PO TBDP
ORAL_TABLET | ORAL | Status: AC
  Filled 2021-02-21: qty 2

## 2021-02-21 MED ORDER — ACETAMINOPHEN 325 MG PO TABS
975.0000 mg | ORAL_TABLET | Freq: Once | ORAL | Status: AC
  Administered 2021-02-21: 21:00:00 975 mg via ORAL

## 2021-02-21 MED ORDER — ONDANSETRON 4 MG PO TBDP
8.0000 mg | ORAL_TABLET | Freq: Once | ORAL | Status: AC
  Administered 2021-02-21: 20:00:00 8 mg via ORAL

## 2021-02-21 MED ORDER — ALUM & MAG HYDROXIDE-SIMETH 200-200-20 MG/5ML PO SUSP
ORAL | Status: AC
  Filled 2021-02-21: qty 30

## 2021-02-21 MED ORDER — ONDANSETRON 4 MG PO TBDP: 4.0000 mg | ORAL_TABLET | Freq: Three times a day (TID) | ORAL | 0 refills | Status: DC | PRN

## 2021-02-21 MED ORDER — LIDOCAINE VISCOUS HCL 2 % MT SOLN
OROMUCOSAL | Status: AC
  Filled 2021-02-21: qty 15

## 2021-02-21 MED ORDER — LIDOCAINE VISCOUS HCL 2 % MT SOLN
15.0000 mL | Freq: Once | OROMUCOSAL | Status: AC
  Administered 2021-02-21: 21:00:00 15 mL via ORAL

## 2021-02-21 MED ORDER — ACETAMINOPHEN 325 MG PO TABS
ORAL_TABLET | ORAL | Status: AC
  Filled 2021-02-21: qty 3

## 2021-02-21 MED ORDER — ALUM & MAG HYDROXIDE-SIMETH 200-200-20 MG/5ML PO SUSP
30.0000 mL | Freq: Once | ORAL | Status: AC
  Administered 2021-02-21: 21:00:00 30 mL via ORAL

## 2021-02-21 NOTE — ED Provider Notes (Signed)
[facility]    CSN: [id number] Arrival date & time: 02/21/21  1931      History   Chief Complaint Chief Complaint  Patient presents with   Abdominal Pain    HPI Michelle Stephens is a 20 y.o. female who presents with recurrent epigastric pain since this am. She ate something spicy last night that thinks is provoiking her symptoms. She has not been taking the Rx prescribed to her in September since she could not afford it. Today has tried Mylanta but vomited it up. LMP 12/28 She denies the food she ate being greasy    Past Medical History:  Diagnosis Date   ADHD (attention deficit hyperactivity disorder)    Allergy    seasonal   Anxiety    Asthma    Depression    Headache(784.0)    Heart murmur    when a baby   JRA (juvenile rheumatoid arthritis) (HCC)    Obesity    Urinary tract infection    Vision abnormalities     Patient Active Problem List   Diagnosis Date Noted   Self-inflicted injury    PTSD (post-traumatic stress disorder) 02/04/2013   ADHD (attention deficit hyperactivity disorder), combined type 02/04/2013   MDD (major depressive disorder), recurrent episode, severe 11/07/2011    Class: Diagnosis of    Past Surgical History:  Procedure Laterality Date   BREAST SURGERY     NO PAST SURGERIES      OB History     Gravida  1   Para      Term      Preterm      AB      Living  0      SAB      IAB      Ectopic      Multiple      Live Births               Home Medications    Prior to Admission medications   Medication Sig Start Date End Date Taking? Authorizing Provider  acetaminophen (TYLENOL) 500 MG tablet Take 1,000 mg by mouth every 6 (six) hours as needed for mild pain, moderate pain or headache.    [provider]  hydrOXYzine (ATARAX/VISTARIL) 25 MG tablet Take 1 tablet (25 mg total) by mouth every 6 (six) hours. 06/14/20   Ivette Loyal, NP  ondansetron (ZOFRAN ODT) 4 MG disintegrating tablet  Take 1 tablet (4 mg total) by mouth every 8 (eight) hours as needed for nausea or vomiting. 02/21/21   Rodriguez-Southworth, Nettie Elm, PA-C    Family History Family History  Problem Relation Age of Onset   Asthma Mother    Diabetes Mother    Hypertension Mother    Depression Mother    Mental illness Mother     Social History Social History   Tobacco Use   Smoking status: Every Day    Packs/day: 0.25    Types: Cigarettes   Smokeless tobacco: Former  Building services engineer Use: Every day   Substances: Nicotine, Flavoring  Substance Use Topics   Alcohol use: Not Currently   Drug use: Yes    Types: Marijuana     Allergies   Coconut flavor and Penicillins   Review of Systems Review of Systems  Constitutional:  Positive for appetite change. Negative for fever.  HENT:  Negative for congestion.   Respiratory:  Negative for cough.   Gastrointestinal:  Positive for abdominal pain, nausea  and vomiting. Negative for blood in stool, constipation and diarrhea.  Genitourinary:  Negative for dysuria.  Musculoskeletal:  Negative for myalgias.  Skin:  Negative for rash.  Neurological:  Negative for headaches.    Physical Exam Triage Vital Signs ED Triage Vitals  Enc Vitals Group     BP 02/21/21 2001 113/69     Pulse Rate 02/21/21 2001 71     Resp 02/21/21 2001 20     Temp 02/21/21 2001 97.7 F (36.5 C)     Temp Source 02/21/21 2001 Oral     SpO2 02/21/21 2001 95 %     Weight --      Height --      Head Circumference --      Peak Flow --      Pain Score 02/21/21 2005 7     Pain Loc --      Pain Edu? --      Excl. in GC? --    No data found.  Updated Vital Signs BP 113/69 (BP Location: Right Arm)    Pulse 71    Temp 97.7 F (36.5 C) (Oral)    Resp 20    LMP 02/21/2021    SpO2 95%   Visual Acuity Right Eye Distance:   Left Eye Distance:   Bilateral Distance:    Right Eye Near:   Left Eye Near:    Bilateral Near:     Physical Exam Vitals and nursing note  reviewed.  Constitutional:      General: She is in acute distress.     Appearance: She is well-developed.     Comments: Dry heaving and vomiting when I walked in the room  HENT:     Right Ear: External ear normal.     Left Ear: External ear normal.  Eyes:     General: No scleral icterus.    Conjunctiva/sclera: Conjunctivae normal.  Pulmonary:     Effort: Pulmonary effort is normal.  Abdominal:     General: Bowel sounds are normal.     Palpations: Abdomen is soft. There is no mass.     Tenderness: There is abdominal tenderness in the epigastric area. There is no guarding or rebound.  Musculoskeletal:        General: Normal range of motion.     Cervical back: Neck supple.  Skin:    General: Skin is warm.     Findings: No rash.     Comments: Clammy after vomiting   Neurological:     Mental Status: She is alert and oriented to person, place, and time.     Gait: Gait normal.  Psychiatric:        Mood and Affect: Mood normal.        Behavior: Behavior normal.        Thought Content: Thought content normal.        Judgment: Judgment normal.     UC Treatments / Results  Labs (all labs ordered are listed, but only abnormal results are displayed) Labs Reviewed - No data to display  EKG   Radiology No results found.  Procedures Procedures (including critical care time)  Medications Ordered in UC Medications  ondansetron (ZOFRAN-ODT) disintegrating tablet 8 mg (8 mg Oral Given 02/21/21 2019)  alum & mag hydroxide-simeth (MAALOX/MYLANTA) 200-200-20 MG/5ML suspension 30 mL (30 mLs Oral Given 02/21/21 2036)    And  lidocaine (XYLOCAINE) 2 % viscous mouth solution 15 mL (15 mLs Oral Given 02/21/21 2036)  acetaminophen (TYLENOL)  tablet 975 mg (975 mg Oral Given 02/21/21 2053)    Initial Impression / Assessment and Plan / UC Course  I have reviewed the triage vital signs and the nursing notes. Pertinent labs & imaging results that were available during my care of the patient  were reviewed by me and considered in my medical decision making (see chart for details). She was given Zofran 8 mg ODT and helped her stop vomiting.  Then she was given GI cocktail which she kept down and helped her abdominal pain by 50% Was given Tylenol 975 mg PO and observed to keep fluids down, and was able to keep 3 oz down without vomiting and was sent home. She was advised to avoid spicy foods, and get Prilosec OTC and FU with GI. See instructions.  Needs to go to ER if she gets worse.     Final Clinical Impressions(s) / UC Diagnoses   Final diagnoses:  Chronic gastritis without bleeding, unspecified gastritis type     Discharge Instructions      Avoid eating spicy foods to prevent recurrence of symptoms. You may purchase equate brand omeprazole 20 mg very cheap and take it 30 minutes before meals.  You need to see a stomach specialist which I attached information above for you       ED Prescriptions     Medication Sig Dispense Auth. Provider   ondansetron (ZOFRAN ODT) 4 MG disintegrating tablet Take 1 tablet (4 mg total) by mouth every 8 (eight) hours as needed for nausea or vomiting. 20 tablet Rodriguez-Southworth, Nettie Elm, PA-C      PDMP not reviewed this encounter.   Garey Ham, Cordelia Poche 02/21/21 2118

## 2021-02-21 NOTE — ED Triage Notes (Signed)
Pt presents with recurrent generalized abdominal pain, nausea, and vomiting since waking up this morning

## 2021-02-21 NOTE — Discharge Instructions (Addendum)
Avoid eating spicy foods to prevent recurrence of symptoms. You may purchase equate brand omeprazole 20 mg very cheap and take it 30 minutes before meals.  Go to the ER if you get worse You need to see a stomach specialist which I attached information above for you

## 2021-12-18 ENCOUNTER — Encounter (HOSPITAL_COMMUNITY): Payer: Self-pay

## 2021-12-18 ENCOUNTER — Emergency Department (HOSPITAL_COMMUNITY)
Admission: EM | Admit: 2021-12-18 | Discharge: 2021-12-19 | Disposition: A | Discharge status: Home / Self Care | Payer: Self-pay | Attending: Emergency Medicine | Admitting: Emergency Medicine

## 2021-12-18 DIAGNOSIS — J020 Streptococcal pharyngitis: Secondary | ICD-10-CM | POA: Insufficient documentation

## 2021-12-18 DIAGNOSIS — Z20822 Contact with and (suspected) exposure to covid-19: Secondary | ICD-10-CM | POA: Insufficient documentation

## 2021-12-18 LAB — RESP PANEL BY RT-PCR (RSV, FLU A&B, COVID)  RVPGX2
Influenza A by PCR: NEGATIVE
Influenza B by PCR: NEGATIVE
Resp Syncytial Virus by PCR: NEGATIVE
SARS Coronavirus 2 by RT PCR: NEGATIVE

## 2021-12-18 LAB — CBC
HCT: 38.9 % (ref 36.0–46.0)
Hemoglobin: 12.5 g/dL (ref 12.0–15.0)
MCH: 26.2 pg (ref 26.0–34.0)
MCHC: 32.1 g/dL (ref 30.0–36.0)
MCV: 81.6 fL (ref 80.0–100.0)
Platelets: 259 10*3/uL (ref 150–400)
RBC: 4.77 MIL/uL (ref 3.87–5.11)
RDW: 13 % (ref 11.5–15.5)
WBC: 10 10*3/uL (ref 4.0–10.5)
nRBC: 0 % (ref 0.0–0.2)

## 2021-12-18 LAB — GROUP A STREP BY PCR: Group A Strep by PCR: DETECTED — AB

## 2021-12-18 MED ORDER — DEXAMETHASONE SODIUM PHOSPHATE 10 MG/ML IJ SOLN
10.0000 mg | Freq: Once | INTRAMUSCULAR | Status: AC
  Administered 2021-12-18: 10 mg via INTRAVENOUS
  Filled 2021-12-18: qty 1

## 2021-12-18 MED ORDER — LIDOCAINE VISCOUS HCL 2 % MT SOLN
15.0000 mL | Freq: Once | OROMUCOSAL | Status: AC
  Administered 2021-12-18: 15 mL via OROMUCOSAL

## 2021-12-18 MED ORDER — SODIUM CHLORIDE 0.9 % IV BOLUS
1000.0000 mL | Freq: Once | INTRAVENOUS | Status: AC
  Administered 2021-12-18: 1000 mL via INTRAVENOUS

## 2021-12-18 MED ORDER — SODIUM CHLORIDE 0.9 % IV SOLN
1.0000 g | Freq: Once | INTRAVENOUS | Status: AC
  Administered 2021-12-18: 1 g via INTRAVENOUS
  Filled 2021-12-18: qty 10

## 2021-12-18 MED ORDER — LIDOCAINE VISCOUS HCL 2 % MT SOLN
OROMUCOSAL | Status: AC
  Filled 2021-12-18: qty 15

## 2021-12-18 NOTE — ED Provider Notes (Signed)
[facility]ENCY [facility]PT Provider Note   CSN: [id number] Arrival date & time: 12/18/21  2059     History  Chief Complaint  Patient presents with   Sore Throat    Michelle Stephens is a 21 y.o. female with past medical history significant for ADHD, anxiety, obesity, depression who presents with concern for sore throat, nasal congestion for 2 days.  Patient reports that it is very severe, and she is having difficulty swallowing at this time.  Patient denies significant fever, chills, cough, nausea, vomiting.  She denies any previous history of tonsillitis, abscess, epiglottitis.  She has not taken anything for the pain prior to arrival.   Sore Throat       Home Medications Prior to Admission medications   Medication Sig Start Date End Date Taking? Authorizing Provider  acetaminophen (TYLENOL) 500 MG tablet Take 1,000 mg by mouth every 6 (six) hours as needed for mild pain, moderate pain or headache.    [provider]  hydrOXYzine (ATARAX/VISTARIL) 25 MG tablet Take 1 tablet (25 mg total) by mouth every 6 (six) hours. 06/14/20   Ivette Loyal, NP  ondansetron (ZOFRAN ODT) 4 MG disintegrating tablet Take 1 tablet (4 mg total) by mouth every 8 (eight) hours as needed for nausea or vomiting. 02/21/21   Rodriguez-Southworth, Nettie Elm, PA-C      Allergies    Coconut flavor and Penicillins    Review of Systems   Review of Systems  HENT:  Positive for sore throat.   All other systems reviewed and are negative.   Physical Exam Updated Vital Signs BP (!) 155/119 (BP Location: Right Arm)   Pulse (!) 110   Temp 98.3 F (36.8 C) (Oral)   Resp 19   LMP 11/27/2021 (Approximate)   SpO2 97%  Physical Exam Vitals and nursing note reviewed.  Constitutional:      General: She is not in acute distress.    Appearance: Normal appearance. She is obese.  HENT:     Head: Normocephalic and atraumatic.     Comments: Patient with 2-3+ tonsils bilaterally,  uvula appears midline, she is significant tenderness, swelling, induration of the throat bilaterally along the cervical chain.  No frontal fluctuance or emphysema noted.  She is minimally tolerating swallowing her own secretions at this point but has nearly closed posterior oropharynx on exam.  No stridor noted.  No floor of mouth swelling , Redness, pain. Eyes:     General:        Right eye: No discharge.        Left eye: No discharge.  Cardiovascular:     Rate and Rhythm: Regular rhythm. Tachycardia present.  Pulmonary:     Effort: Pulmonary effort is normal. No respiratory distress.  Musculoskeletal:        General: No deformity.  Skin:    General: Skin is warm and dry.  Neurological:     Mental Status: She is alert and oriented to person, place, and time.  Psychiatric:        Mood and Affect: Mood normal.        Behavior: Behavior normal.     ED Results / Procedures / Treatments   Labs (all labs ordered are listed, but only abnormal results are displayed) Labs Reviewed  GROUP A STREP BY PCR - Abnormal; Notable for the following components:      Result Value   Group A Strep by PCR DETECTED (*)    All other components within  normal limits  RESP PANEL BY RT-PCR (RSV, FLU A&B, COVID)  RVPGX2  CBC  BASIC METABOLIC PANEL  I-STAT BETA HCG BLOOD, ED ([facility], [facility], [facility] ONLY)    EKG None  Radiology No results found.  Procedures Procedures    Medications Ordered in ED Medications  cefTRIAXone (ROCEPHIN) 1 g in sodium chloride 0.9 % 100 mL IVPB (1 g Intravenous New Bag/Given 12/18/21 2329)  lidocaine (XYLOCAINE) 2 % viscous mouth solution 15 mL (15 mLs Mouth/Throat Given 12/18/21 2242)  sodium chloride 0.9 % bolus 1,000 mL (1,000 mLs Intravenous New Bag/Given 12/18/21 2329)  dexamethasone (DECADRON) injection 10 mg (10 mg Intravenous Given 12/18/21 2330)    ED Course/ Medical Decision Making/ A&P                           Medical Decision Making Amount and/or Complexity of  Data Reviewed Labs: ordered. Radiology: ordered.  Risk Prescription drug management.   This patient is a 21 y.o. female who presents to the ED for concern of sore throat, difficulty swallowing, this involves an extensive number of treatment options, and is a complaint that carries with it a high risk of complications and morbidity. The emergent differential diagnosis prior to evaluation includes, but is not limited to, epiglottitis, tonsillitis, peritonsillar abscess, Ludwig angina, versus other emergency versus simple strep pharyngitis, or other viral pharyngitis, tonsillitis versus mono versus other.   This is not an exhaustive differential.   Past Medical History / Co-morbidities / Social History: ADHD, depression, anxiety, obesity, juvenile rheumatoid arthritis  Additional history: Chart reviewed. Pertinent results include: Reviewed lab work, imaging from emergency department and urgent care visits  Physical Exam: Physical exam performed.  The pertinent findings include: On exam patient with 3+ tonsils bilaterally, crowded oropharynx but uvula is midline.  No evidence of Ludwig angina, no unilateral deviation suggestive of obvious PTA but possible PTA possible.  She has induration, tenderness, and redness of the Cervical chain bilaterally.  She is having difficulty tolerating secretions but can swallow small amounts of saliva, no stridor at this time.  Lab Tests: I ordered, and personally interpreted labs.  The pertinent results include: CBC unremarkable, no leukocytosis.  RVP is negative for COVID, flu, RSV.  Strep is positive.  Is pending at this time.   Imaging Studies: I ordered imaging studies including CT soft tissue neck with contrast. I independently visualized and interpreted imaging which is pending at this time   Medications: I ordered medication including ceftriaxone for strep in context of penicillin allergy but is not anaphylaxis, fluid bolus, viscous lidocaine, Decadron  for throat swelling, throat pain. Reevaluation of the patient after these medicines showed that the patient improved. I have reviewed the patients home medicines and have made adjustments as needed.  11:52 PM Care of Michelle Stephens transferred to PA Deno Etienne and Dr. Ursula Beath at the end of my shift as the patient will require reassessment once labs/imaging have resulted. Patient presentation, ED course, and plan of care discussed with review of all pertinent labs and imaging. Please see his/her note for further details regarding further ED course and disposition. Plan at time of handoff is pending CT Soft Tissue Neck and re-evaluation, possible DC on abx if able to swallow and no surgical ENT dx. This may be altered or completely changed at the discretion of the oncoming team pending results of further workup.   Final Clinical Impression(s) / ED Diagnoses Final diagnoses:  None  Rx / DC Orders ED Discharge Orders     None         Olene Floss, PA-C 12/18/21 2352    Sloan Leiter, DO 12/20/21 [phone]

## 2021-12-18 NOTE — ED Triage Notes (Signed)
Pt c/o sore throat, nasal congestion x 2 days.

## 2021-12-19 ENCOUNTER — Emergency Department (HOSPITAL_COMMUNITY): Payer: Self-pay

## 2021-12-19 LAB — BASIC METABOLIC PANEL
Anion gap: 6 (ref 5–15)
BUN: 7 mg/dL (ref 6–20)
CO2: 27 mmol/L (ref 22–32)
Calcium: 9 mg/dL (ref 8.9–10.3)
Chloride: 104 mmol/L (ref 98–111)
Creatinine, Ser: 0.77 mg/dL (ref 0.44–1.00)
GFR, Estimated: >60 mL/min (ref >60)
Glucose, Bld: 100 mg/dL — ABNORMAL HIGH (ref 70–99)
Potassium: 3.3 mmol/L — ABNORMAL LOW (ref 3.5–5.1)
Sodium: 137 mmol/L (ref 135–145)

## 2021-12-19 LAB — I-STAT BETA HCG BLOOD, ED (MC, WL, AP ONLY): I-stat hCG, quantitative: <5 m[IU]/mL (ref <5)

## 2021-12-19 MED ORDER — LIDOCAINE VISCOUS HCL 2 % MT SOLN
15.0000 mL | Freq: Once | OROMUCOSAL | Status: AC
  Administered 2021-12-19: 15 mL via OROMUCOSAL
  Filled 2021-12-19: qty 15

## 2021-12-19 MED ORDER — IOHEXOL 300 MG/ML  SOLN
75.0000 mL | Freq: Once | INTRAMUSCULAR | Status: AC | PRN
  Administered 2021-12-19: 75 mL via INTRAVENOUS

## 2021-12-19 MED ORDER — AZITHROMYCIN 500 MG PO TABS: 500.0000 mg | ORAL_TABLET | Freq: Every day | ORAL | 0 refills | Status: AC

## 2021-12-19 MED ORDER — LIDOCAINE VISCOUS HCL 2 % MT SOLN: 15.0000 mL | Freq: Four times a day (QID) | OROMUCOSAL | 0 refills | Status: AC | PRN

## 2021-12-19 NOTE — ED Provider Notes (Signed)
Received at shift change from [place] prosperi PA-C please see note for full detail  In short patient medical history including peritonsillar abscess presents with complaints of sore throat, going on for last couple days for the last 24 hours pain  gotten worse, pain with swallowing, endorses change in voice, but still tolerating p.o., associated nasal congestion and a slight cough nonproductive no fevers no chills.  Per previous provider follow-up on CT imaging and treat accordingly.    Previous provider gave patient ceftriaxone fluids and Decadron.  Physical Exam  BP (!) 155/119 (BP Location: Right Arm)   Pulse (!) 110   Temp 98.3 F (36.8 C) (Oral)   Resp 19   LMP 11/27/2021 (Approximate)   SpO2 97%   Physical Exam Vitals and nursing note reviewed.  Constitutional:      General: She is not in acute distress.    Appearance: She is not ill-appearing.  HENT:     Head: Normocephalic and atraumatic.     Nose: No congestion.     Mouth/Throat:     Mouth: Mucous membranes are moist.     Pharynx: Oropharynx is clear. Posterior oropharyngeal erythema present.     Comments: No trismus no torticollis no oral edema present, tongue uvula midline controlling oral secretions uvula midline tonsils both equal symmetric bilaterally, erythematous without exudate, no submandibular swelling no muffled tone voice. Eyes:     Conjunctiva/sclera: Conjunctivae normal.  Cardiovascular:     Rate and Rhythm: Normal rate and regular rhythm.     Pulses: Normal pulses.     Heart sounds: No murmur heard.    No friction rub. No gallop.  Pulmonary:     Effort: No respiratory distress.     Breath sounds: No wheezing, rhonchi or rales.  Skin:    General: Skin is warm and dry.  Neurological:     Mental Status: She is alert.  Psychiatric:        Mood and Affect: Mood normal.     Procedures  Procedures  ED Course / MDM    Medical Decision Making Amount and/or Complexity of Data Reviewed Labs:  ordered. Radiology: ordered.  Risk Prescription drug management.   Lab Tests:  I Ordered, and personally interpreted labs.  The pertinent results include: CBC unremarkable BMP shows potassium 3.3 glucose 100, urine pregnancy negative, strep test positive   Imaging Studies ordered:  I ordered imaging studies including CT soft tissue neck I independently visualized and interpreted imaging which showed tonsillitis without evidence of an abscess I agree with the radiologist interpretation   Cardiac Monitoring:  The patient was maintained on a cardiac monitor.  I personally viewed and interpreted the cardiac monitored which showed an underlying rhythm of: N/A   Medicines ordered and prescription drug management:  I ordered medication including viscous lidocaine I have reviewed the patients home medicines and have made adjustments as needed  Critical Interventions:  N/A   Reevaluation:  Patient was updated on lab work and imaging, she is agreement plan discharge at this time.  Consultations Obtained:  N/A    Test Considered:  N/A    Rule out I have low suspicion for peritonsillar abscess, retropharyngeal abscess, or Ludwig angina as oropharynx was visualized tongue and uvula were both midline, no edema noted in the posterior pillars or on/ around tonsils CT imaging is also negative for this.  Low suspicion for periorbital or orbital cellulitis as patient face had no erythematous, patient EOMs were intact, he had no pain with  eye movement.     Dispostion and problem list  After consideration of the diagnostic results and the patients response to treatment, I feel that the patent would benefit from discharge.   Strep pharyngitis-patient has penicillin allergy, she was given ceftriaxone, will start her on azithromycin, patient has no insurance referred her to good Rx Z-Pak will cost about $15 which patient is able to afford.         Carroll Sage,  PA-C 12/19/21 0214    Sloan Leiter, DO 12/20/21 [phone]

## 2021-12-19 NOTE — Discharge Instructions (Addendum)
You have strep pharyngitis,  starting you  on azithromycin please take as prescribed, this medication costs approximately $15  may use good Rx for a coupon.  Also given you viscous lidocaine to help with your sore throat, I recommend ibuprofen Tylenol,  throat lozenges, tea/ honey as well.  Follow-up with your PCP as needed  Come back to the emergency department if you develop chest pain, shortness of breath, severe abdominal pain, uncontrolled nausea, vomiting, diarrhea.

## 2022-01-23 ENCOUNTER — Ambulatory Visit (HOSPITAL_COMMUNITY)
Admission: EM | Admit: 2022-01-23 | Discharge: 2022-01-23 | Disposition: A | Discharge status: Home / Self Care | Payer: Self-pay | Attending: Family Medicine | Admitting: Family Medicine

## 2022-01-23 ENCOUNTER — Encounter (HOSPITAL_COMMUNITY): Payer: Self-pay

## 2022-01-23 DIAGNOSIS — K219 Gastro-esophageal reflux disease without esophagitis: Secondary | ICD-10-CM

## 2022-01-23 DIAGNOSIS — N3090 Cystitis, unspecified without hematuria: Secondary | ICD-10-CM

## 2022-01-23 LAB — POCT URINALYSIS DIPSTICK, ED / UC
Bilirubin Urine: NEGATIVE
Glucose, UA: NEGATIVE mg/dL
Hgb urine dipstick: NEGATIVE
Ketones, ur: NEGATIVE mg/dL
Nitrite: NEGATIVE
Protein, ur: NEGATIVE mg/dL
Specific Gravity, Urine: 1.02 (ref 1.005–1.030)
Urobilinogen, UA: 0.2 mg/dL (ref 0.0–1.0)
pH: 7.5 (ref 5.0–8.0)

## 2022-01-23 LAB — POC URINE PREG, ED
Preg Test, Ur: NEGATIVE
Preg Test, Ur: NEGATIVE

## 2022-01-23 LAB — CBG MONITORING, ED: Glucose-Capillary: 71 mg/dL (ref 70–99)

## 2022-01-23 MED ORDER — ALUM & MAG HYDROXIDE-SIMETH 200-200-20 MG/5ML PO SUSP
ORAL | Status: AC
  Filled 2022-01-23: qty 30

## 2022-01-23 MED ORDER — ALUM & MAG HYDROXIDE-SIMETH 200-200-20 MG/5ML PO SUSP
30.0000 mL | Freq: Once | ORAL | Status: AC
  Administered 2022-01-23: 30 mL via ORAL

## 2022-01-23 MED ORDER — SULFAMETHOXAZOLE-TRIMETHOPRIM 800-160 MG PO TABS: 1.0000 | ORAL_TABLET | Freq: Two times a day (BID) | ORAL | 0 refills | Status: AC

## 2022-01-23 MED ORDER — PANTOPRAZOLE SODIUM 20 MG PO TBEC: 20.0000 mg | DELAYED_RELEASE_TABLET | Freq: Every day | ORAL | 0 refills | Status: DC

## 2022-01-23 MED ORDER — LIDOCAINE VISCOUS HCL 2 % MT SOLN
15.0000 mL | Freq: Once | OROMUCOSAL | Status: AC
  Administered 2022-01-23: 15 mL via OROMUCOSAL

## 2022-01-23 MED ORDER — LIDOCAINE VISCOUS HCL 2 % MT SOLN
OROMUCOSAL | Status: AC
  Filled 2022-01-23: qty 15

## 2022-01-23 NOTE — ED Triage Notes (Signed)
Pt is here for acid reflux x 4days

## 2022-01-23 NOTE — Discharge Instructions (Addendum)
You have had labs (urine culture) sent today. We will call you with any significant abnormalities or if there is need to begin or change treatment or pursue further follow up.  You may also review your test results online through MyChart. If you do not have a MyChart account, instructions to sign up should be on your discharge paperwork.  

## 2022-01-23 NOTE — ED Provider Notes (Signed)
[facility]   [id number] 01/23/22 Arrival Time: 1513  ASSESSMENT & PLAN:  1. Gastroesophageal reflux disease without esophagitis   2. Cystitis    Urine culture sent. Will tx empirically for UTI. Reports that GI cocktail has helped.  Meds ordered this encounter  Medications   alum & mag hydroxide-simeth (MAALOX/MYLANTA) 200-200-20 MG/5ML suspension 30 mL   lidocaine (XYLOCAINE) 2 % viscous mouth solution 15 mL   pantoprazole (PROTONIX) 20 MG tablet    Sig: Take 1 tablet (20 mg total) by mouth daily.    Dispense:  30 tablet    Refill:  0   sulfamethoxazole-trimethoprim (BACTRIM DS) 800-160 MG tablet    Sig: Take 1 tablet by mouth 2 (two) times daily for 3 days.    Dispense:  6 tablet    Refill:  0   Tolerating PO fluids without difficulty. Work note provided. May f/u here as needed.    Discharge Instructions      You have had labs (urine culture) sent today. We will call you with any significant abnormalities or if there is need to begin or change treatment or pursue further follow up.  You may also review your test results online through MyChart. If you do not have a MyChart account, instructions to sign up should be on your discharge paperwork.      Reviewed expectations re: course of current medical issues. Questions answered. Outlined signs and symptoms indicating need for more acute intervention. Patient verbalized understanding. After Visit Summary given.   SUBJECTIVE: History from: patient.  Michelle Stephens is a 21 y.o. female who reports "acid reflux"; burping more past few days; with stomach "burning sensation". H/O similar in past. No Rx treatment. Reports normal PO intake without n/v/d. Also reports mild urinary frequency. Sev days. No vaginal d/c. Afebrile. No back/flank pain.  Patient's last menstrual period was 01/02/2022.  Past Surgical History:  Procedure Laterality Date   BREAST SURGERY     NO PAST SURGERIES      OBJECTIVE:  General appearance: alert; no distress Oropharynx: moist Lungs: clear to auscultation bilaterally; unlabored Heart: regular rate and rhythm Abdomen: soft; non-distended; no significant abdominal tenderness; no masses or organomegaly; no guarding or rebound tenderness Back: no CVA tenderness Extremities: no edema; symmetrical with no gross deformities Skin: warm; dry Neurologic: normal gait Psychological: alert and cooperative; normal mood and affect  Labs: Results for orders placed or performed during the hospital encounter of 01/23/22  POC Urinalysis dipstick  Result Value Ref Range   Glucose, UA NEGATIVE NEGATIVE mg/dL   Bilirubin Urine NEGATIVE NEGATIVE   Ketones, ur NEGATIVE NEGATIVE mg/dL   Specific Gravity, Urine 1.020 1.005 - 1.030   Hgb urine dipstick NEGATIVE NEGATIVE   pH 7.5 5.0 - 8.0   Protein, ur NEGATIVE NEGATIVE mg/dL   Urobilinogen, UA 0.2 0.0 - 1.0 mg/dL   Nitrite NEGATIVE NEGATIVE   Leukocytes,Ua TRACE (A) NEGATIVE  POC CBG monitoring  Result Value Ref Range   Glucose-Capillary 71 70 - 99 mg/dL  POC urine pregnancy  Result Value Ref Range   Preg Test, Ur NEGATIVE NEGATIVE  POC urine preg, ED (not at [facility])  Result Value Ref Range   Preg Test, Ur NEGATIVE NEGATIVE   Labs Reviewed  POCT URINALYSIS DIPSTICK, ED / UC - Abnormal; Notable for the following components:      Result Value   Leukocytes,Ua TRACE (*)    All other components within normal limits  URINE CULTURE  CBG MONITORING, ED  POC URINE PREG, ED  POC URINE PREG, ED   Allergies  Allergen Reactions   Coconut Flavor Hives   Penicillins Other (See Comments)    unknown In childhood                                                Past Medical History:  Diagnosis Date   ADHD (attention deficit hyperactivity disorder)    Allergy    seasonal   Anxiety    Asthma    Depression    Headache(784.0)    Heart murmur    when a baby   JRA (juvenile rheumatoid arthritis) (HCC)     Obesity    Urinary tract infection    Vision abnormalities    Social History   Socioeconomic History   Marital status: Significant Other    Spouse name: Not on file   Number of children: Not on file   Years of education: Not on file   Highest education level: Not on file  Occupational History   Occupation: Dentist: UNEMPLOYED    Comment: 7th grade at [employer]  Tobacco Use   Smoking status: Every Day    Packs/day: 0.25    Types: Cigarettes   Smokeless tobacco: Former  Building services engineer Use: Every day   Substances: Nicotine, Flavoring  Substance and Sexual Activity   Alcohol use: Not Currently   Drug use: Yes    Types: Marijuana   Sexual activity: Yes    Birth control/protection: None  Other Topics Concern   Not on file  Social History Narrative   Not on file   Social Determinants of Health   Financial Resource Strain: Not on file  Food Insecurity: Not on file  Transportation Needs: Not on file  Physical Activity: Not on file  Stress: Not on file  Social Connections: Not on file  Intimate Partner Violence: Not on file   Family History  Problem Relation Age of Onset   Asthma Mother    Diabetes Mother    Hypertension Mother    Depression Mother    Mental illness Mother       Mardella Layman, MD 01/23/22 [phone]

## 2022-01-24 LAB — URINE CULTURE: Culture: <10000 — AB

## 2022-05-25 ENCOUNTER — Encounter (HOSPITAL_COMMUNITY): Payer: Self-pay

## 2022-05-25 ENCOUNTER — Ambulatory Visit (HOSPITAL_COMMUNITY)
Admission: EM | Admit: 2022-05-25 | Discharge: 2022-05-25 | Disposition: A | Discharge status: Home / Self Care | Payer: Self-pay | Attending: Family Medicine | Admitting: Family Medicine

## 2022-05-25 ENCOUNTER — Emergency Department (HOSPITAL_COMMUNITY): Payer: Self-pay

## 2022-05-25 ENCOUNTER — Other Ambulatory Visit: Payer: Self-pay

## 2022-05-25 ENCOUNTER — Emergency Department (HOSPITAL_COMMUNITY)
Admission: EM | Admit: 2022-05-25 | Discharge: 2022-05-26 | Discharge status: Against Medical Advice | Payer: Self-pay | Attending: Emergency Medicine | Admitting: Emergency Medicine

## 2022-05-25 DIAGNOSIS — N3000 Acute cystitis without hematuria: Secondary | ICD-10-CM | POA: Insufficient documentation

## 2022-05-25 DIAGNOSIS — R102 Pelvic and perineal pain: Secondary | ICD-10-CM | POA: Insufficient documentation

## 2022-05-25 DIAGNOSIS — Z5329 Procedure and treatment not carried out because of patient's decision for other reasons: Secondary | ICD-10-CM | POA: Insufficient documentation

## 2022-05-25 DIAGNOSIS — R1030 Lower abdominal pain, unspecified: Secondary | ICD-10-CM | POA: Insufficient documentation

## 2022-05-25 LAB — WET PREP, GENITAL
Clue Cells Wet Prep HPF POC: NONE SEEN
Sperm: NONE SEEN
Trich, Wet Prep: NONE SEEN
WBC, Wet Prep HPF POC: >=10 — AB (ref <10)
Yeast Wet Prep HPF POC: NONE SEEN

## 2022-05-25 LAB — CBC WITH DIFFERENTIAL/PLATELET
Abs Immature Granulocytes: 0.01 10*3/uL (ref 0.00–0.07)
Basophils Absolute: 0 10*3/uL (ref 0.0–0.1)
Basophils Relative: 0 %
Eosinophils Absolute: 0 10*3/uL (ref 0.0–0.5)
Eosinophils Relative: 0 %
HCT: 40.7 % (ref 36.0–46.0)
Hemoglobin: 13.1 g/dL (ref 12.0–15.0)
Immature Granulocytes: 0 %
Lymphocytes Relative: 36 %
Lymphs Abs: 2.4 10*3/uL (ref 0.7–4.0)
MCH: 25.6 pg — ABNORMAL LOW (ref 26.0–34.0)
MCHC: 32.2 g/dL (ref 30.0–36.0)
MCV: 79.6 fL — ABNORMAL LOW (ref 80.0–100.0)
Monocytes Absolute: 0.5 10*3/uL (ref 0.1–1.0)
Monocytes Relative: 8 %
Neutro Abs: 3.6 10*3/uL (ref 1.7–7.7)
Neutrophils Relative %: 56 %
Platelets: 213 10*3/uL (ref 150–400)
RBC: 5.11 MIL/uL (ref 3.87–5.11)
RDW: 12.9 % (ref 11.5–15.5)
WBC: 6.5 10*3/uL (ref 4.0–10.5)
nRBC: 0 % (ref 0.0–0.2)

## 2022-05-25 LAB — URINALYSIS, W/ REFLEX TO CULTURE (INFECTION SUSPECTED)
Bacteria, UA: NONE SEEN
Bilirubin Urine: NEGATIVE
Glucose, UA: NEGATIVE mg/dL
Hgb urine dipstick: NEGATIVE
Ketones, ur: NEGATIVE mg/dL
Nitrite: NEGATIVE
Protein, ur: NEGATIVE mg/dL
Specific Gravity, Urine: 1.021 (ref 1.005–1.030)
pH: 6 (ref 5.0–8.0)

## 2022-05-25 LAB — COMPREHENSIVE METABOLIC PANEL
ALT: 13 U/L (ref 0–44)
AST: 19 U/L (ref 15–41)
Albumin: 4.5 g/dL (ref 3.5–5.0)
Alkaline Phosphatase: 83 U/L (ref 38–126)
Anion gap: 6 (ref 5–15)
BUN: 12 mg/dL (ref 6–20)
CO2: 26 mmol/L (ref 22–32)
Calcium: 8.8 mg/dL — ABNORMAL LOW (ref 8.9–10.3)
Chloride: 100 mmol/L (ref 98–111)
Creatinine, Ser: 0.75 mg/dL (ref 0.44–1.00)
GFR, Estimated: >60 mL/min (ref >60)
Glucose, Bld: 87 mg/dL (ref 70–99)
Potassium: 3.9 mmol/L (ref 3.5–5.1)
Sodium: 132 mmol/L — ABNORMAL LOW (ref 135–145)
Total Bilirubin: 0.4 mg/dL (ref 0.3–1.2)
Total Protein: 8.6 g/dL — ABNORMAL HIGH (ref 6.5–8.1)

## 2022-05-25 LAB — POCT URINALYSIS DIPSTICK, ED / UC
Bilirubin Urine: NEGATIVE
Glucose, UA: NEGATIVE mg/dL
Hgb urine dipstick: NEGATIVE
Ketones, ur: NEGATIVE mg/dL
Nitrite: NEGATIVE
Protein, ur: NEGATIVE mg/dL
Specific Gravity, Urine: 1.025 (ref 1.005–1.030)
Urobilinogen, UA: 1 mg/dL (ref 0.0–1.0)
pH: 6.5 (ref 5.0–8.0)

## 2022-05-25 LAB — LIPASE, BLOOD: Lipase: 30 U/L (ref 11–51)

## 2022-05-25 LAB — POC URINE PREG, ED: Preg Test, Ur: NEGATIVE

## 2022-05-25 LAB — PREGNANCY, URINE: Preg Test, Ur: NEGATIVE

## 2022-05-25 MED ORDER — CEFTRIAXONE SODIUM 1 G IJ SOLR
500.0000 mg | Freq: Once | INTRAMUSCULAR | Status: AC
  Administered 2022-05-25: 500 mg via INTRAMUSCULAR
  Filled 2022-05-25: qty 10

## 2022-05-25 MED ORDER — HYDROMORPHONE HCL 1 MG/ML IJ SOLN
1.0000 mg | Freq: Once | INTRAMUSCULAR | Status: AC
  Administered 2022-05-25: 1 mg via INTRAVENOUS
  Filled 2022-05-25: qty 1

## 2022-05-25 MED ORDER — LIDOCAINE HCL 1 % IJ SOLN
INTRAMUSCULAR | Status: AC
  Administered 2022-05-25: 1 mL
  Filled 2022-05-25: qty 20

## 2022-05-25 MED ORDER — KETOROLAC TROMETHAMINE 15 MG/ML IJ SOLN
15.0000 mg | Freq: Once | INTRAMUSCULAR | Status: AC
  Administered 2022-05-25: 15 mg via INTRAVENOUS
  Filled 2022-05-25: qty 1

## 2022-05-25 MED ORDER — SULFAMETHOXAZOLE-TRIMETHOPRIM 800-160 MG PO TABS: 1.0000 | ORAL_TABLET | Freq: Two times a day (BID) | ORAL | 0 refills | Status: DC

## 2022-05-25 MED ORDER — DOXYCYCLINE HYCLATE 100 MG PO TABS
100.0000 mg | ORAL_TABLET | Freq: Once | ORAL | Status: AC
  Administered 2022-05-25: 100 mg via ORAL
  Filled 2022-05-25: qty 1

## 2022-05-25 NOTE — ED Notes (Signed)
Pelvic cart set up a bedside. Pt. Undressed from the waist down

## 2022-05-25 NOTE — Discharge Instructions (Addendum)
Advised patient to take medication as directed with food to completion.  Encouraged patient to increase daily water intake to 64 ounces per day while taking this medication.  Advised we will follow-up with urine culture and Aptima swab results once received.  Advised patient to go to [facility] ED now for further evaluation and possible pelvic ultrasound.

## 2022-05-25 NOTE — ED Notes (Signed)
Patient is being discharged from the Urgent Care and sent to the Emergency Department via personal vehicle. Per Eliezer Lofts, patient is in need of higher level of care due to sever pelvic pain. Patient is aware and verbalizes understanding of plan of care.  Vitals:   05/25/22 1705  BP: (!) 146/96  Pulse: 76  Resp: 18  Temp: 98.4 F (36.9 C)  SpO2: 98%

## 2022-05-25 NOTE — ED Provider Notes (Signed)
[facility] Provider Note   CSN: [id number] Arrival date & time: 05/25/22  1806     History  Chief Complaint  Patient presents with   Pelvic Pain    Michelle Stephens is a 22 y.o. female.  HPI 23 year old female presents with lower abdominal/pelvic pain.  Is been really severe for the last 3 days but it has been going on for about 5 days.  She initially attributed it to "rough sex" but she does not remember an actual injury or time of onset.  The pain is diffuse across her lower abdomen.  No vomiting, diarrhea, dysuria or vaginal discharge/bleeding.  She has been taking over-the-counter pain meds and someone's tramadol with no relief.  She also does have some back pain and the pain seems to radiate down both thighs.  There is no leg weakness or numbness however.  No fevers.  She went to urgent care where she had a urinalysis that was questionable for UTI and was prescribed antibiotics but told to come here for a pelvic ultrasound.  Home Medications Prior to Admission medications   Medication Sig Start Date End Date Taking? Authorizing Provider  acetaminophen (TYLENOL) 500 MG tablet Take 1,000 mg by mouth every 6 (six) hours as needed for mild pain, moderate pain or headache.   Yes [provider]  sulfamethoxazole-trimethoprim (BACTRIM DS) 800-160 MG tablet Take 1 tablet by mouth 2 (two) times daily for 3 days. Patient not taking: Reported on 05/25/2022 05/25/22 05/28/22  Eliezer Lofts, FNP      Allergies    Coconut flavor and Penicillins    Review of Systems   Review of Systems  Gastrointestinal:  Positive for abdominal pain. Negative for diarrhea and vomiting.  Genitourinary:  Positive for pelvic pain. Negative for dysuria, vaginal bleeding and vaginal discharge.    Physical Exam Updated Vital Signs BP (!) 117/90   Pulse 88   Temp 98.5 F (36.9 C) (Oral)   Resp 19   Ht 5\' 5"  (1.651 m)   Wt 119.3 kg   LMP 05/10/2022  (Approximate)   SpO2 99%   BMI 43.77 kg/m  Physical Exam Vitals and nursing note reviewed. Exam conducted with a chaperone present.  Constitutional:      Appearance: She is well-developed. She is obese.  HENT:     Head: Normocephalic and atraumatic.  Cardiovascular:     Rate and Rhythm: Normal rate and regular rhythm.     Pulses:          Dorsalis pedis pulses are 2+ on the right side and 2+ on the left side.     Heart sounds: Normal heart sounds.  Pulmonary:     Effort: Pulmonary effort is normal.     Breath sounds: Normal breath sounds.  Abdominal:     Palpations: Abdomen is soft.     Tenderness: There is abdominal tenderness (mild, lower, non-focal).  Genitourinary:    Labia:        Right: No rash.        Left: No rash.      Vagina: No bleeding.     Cervix: Discharge present. No cervical motion tenderness.  Musculoskeletal:     Thoracic back: No tenderness.     Lumbar back: No tenderness.  Skin:    General: Skin is warm and dry.  Neurological:     Mental Status: She is alert.     Comments: 5/5 strength in BLE. Grossly normal sensation  ED Results / Procedures / Treatments   Labs (all labs ordered are listed, but only abnormal results are displayed) Labs Reviewed  WET PREP, GENITAL - Abnormal; Notable for the following components:      Result Value   WBC, Wet Prep HPF POC >=10 (*)    All other components within normal limits  COMPREHENSIVE METABOLIC PANEL - Abnormal; Notable for the following components:   Sodium 132 (*)    Calcium 8.8 (*)    Total Protein 8.6 (*)    All other components within normal limits  CBC WITH DIFFERENTIAL/PLATELET - Abnormal; Notable for the following components:   MCV 79.6 (*)    MCH 25.6 (*)    All other components within normal limits  URINALYSIS, W/ REFLEX TO CULTURE (INFECTION SUSPECTED) - Abnormal; Notable for the following components:   APPearance HAZY (*)    Leukocytes,Ua LARGE (*)    All other components within normal  limits  LIPASE, BLOOD  PREGNANCY, URINE  HIV ANTIBODY (ROUTINE TESTING W REFLEX)  GC/CHLAMYDIA PROBE AMP ([place]) NOT AT [facility]    EKG None  Radiology US Pelvis Complete  Result Date: 05/25/2022 CLINICAL DATA:  Lower abdominal/pelvic pain. EXAM: TRANSABDOMINAL AND TRANSVAGINAL ULTRASOUND OF PELVIS DOPPLER ULTRASOUND OF OVARIES TECHNIQUE: Both transabdominal and transvaginal ultrasound examinations of the pelvis were performed. Transabdominal technique was performed for global imaging of the pelvis including uterus, ovaries, adnexal regions, and pelvic cul-de-sac. It was necessary to proceed with endovaginal exam following the transabdominal exam to visualize the ovaries. Color and duplex Doppler ultrasound was utilized to evaluate blood flow to the ovaries. COMPARISON:  05/15/2018. FINDINGS: Uterus Measurements: 7.7 x 3.6 x 4.6 cm = volume: 66.80 mL. No fibroids or other mass visualized. Endometrium Thickness: 4 mm.  No focal abnormality visualized. Right ovary Measurements: 3.0 x 2.3 x 1.9 cm = volume: 7.1 mL. Normal appearance/no adnexal mass. Left ovary Measurements: 1.9 x 1.8 x 1.5 cm = volume: 2.7 mL. Normal appearance/no adnexal mass. Pulsed Doppler evaluation of both ovaries demonstrates normal low-resistance arterial and venous waveforms. Other findings No abnormal free fluid. IMPRESSION: No evidence of ovarian torsion or other acute process. Electronically Signed   By: Brett Fairy M.D.   On: 05/25/2022 21:40   US Transvaginal Non-OB  Result Date: 05/25/2022 CLINICAL DATA:  Lower abdominal/pelvic pain. EXAM: TRANSABDOMINAL AND TRANSVAGINAL ULTRASOUND OF PELVIS DOPPLER ULTRASOUND OF OVARIES TECHNIQUE: Both transabdominal and transvaginal ultrasound examinations of the pelvis were performed. Transabdominal technique was performed for global imaging of the pelvis including uterus, ovaries, adnexal regions, and pelvic cul-de-sac. It was necessary to proceed with endovaginal exam following  the transabdominal exam to visualize the ovaries. Color and duplex Doppler ultrasound was utilized to evaluate blood flow to the ovaries. COMPARISON:  05/15/2018. FINDINGS: Uterus Measurements: 7.7 x 3.6 x 4.6 cm = volume: 66.80 mL. No fibroids or other mass visualized. Endometrium Thickness: 4 mm.  No focal abnormality visualized. Right ovary Measurements: 3.0 x 2.3 x 1.9 cm = volume: 7.1 mL. Normal appearance/no adnexal mass. Left ovary Measurements: 1.9 x 1.8 x 1.5 cm = volume: 2.7 mL. Normal appearance/no adnexal mass. Pulsed Doppler evaluation of both ovaries demonstrates normal low-resistance arterial and venous waveforms. Other findings No abnormal free fluid. IMPRESSION: No evidence of ovarian torsion or other acute process. Electronically Signed   By: Brett Fairy M.D.   On: 05/25/2022 21:40   [place] Art/Ven Flow Abd Pelv Doppler  Result Date: 05/25/2022 CLINICAL DATA:  Lower abdominal/pelvic pain. EXAM: TRANSABDOMINAL AND  TRANSVAGINAL ULTRASOUND OF PELVIS DOPPLER ULTRASOUND OF OVARIES TECHNIQUE: Both transabdominal and transvaginal ultrasound examinations of the pelvis were performed. Transabdominal technique was performed for global imaging of the pelvis including uterus, ovaries, adnexal regions, and pelvic cul-de-sac. It was necessary to proceed with endovaginal exam following the transabdominal exam to visualize the ovaries. Color and duplex Doppler ultrasound was utilized to evaluate blood flow to the ovaries. COMPARISON:  05/15/2018. FINDINGS: Uterus Measurements: 7.7 x 3.6 x 4.6 cm = volume: 66.80 mL. No fibroids or other mass visualized. Endometrium Thickness: 4 mm.  No focal abnormality visualized. Right ovary Measurements: 3.0 x 2.3 x 1.9 cm = volume: 7.1 mL. Normal appearance/no adnexal mass. Left ovary Measurements: 1.9 x 1.8 x 1.5 cm = volume: 2.7 mL. Normal appearance/no adnexal mass. Pulsed Doppler evaluation of both ovaries demonstrates normal low-resistance arterial and venous waveforms.  Other findings No abnormal free fluid. IMPRESSION: No evidence of ovarian torsion or other acute process. Electronically Signed   By: Brett Fairy M.D.   On: 05/25/2022 21:40    Procedures Procedures    Medications Ordered in ED Medications  HYDROmorphone (DILAUDID) injection 1 mg (1 mg Intravenous Given 05/25/22 1947)  ketorolac (TORADOL) 15 MG/ML injection 15 mg (15 mg Intravenous Given 05/25/22 2150)  cefTRIAXone (ROCEPHIN) injection 500 mg (500 mg Intramuscular Given 05/25/22 2153)  doxycycline (VIBRA-TABS) tablet 100 mg (100 mg Oral Given 05/25/22 2150)  lidocaine (XYLOCAINE) 1 % (with pres) injection (1 mL  Given 05/25/22 2153)  HYDROmorphone (DILAUDID) injection 1 mg (1 mg Intravenous Given 05/25/22 2238)    ED Course/ Medical Decision Making/ A&P                             Medical Decision Making Amount and/or Complexity of Data Reviewed Labs: ordered.    Details: WBCs in wet prep. Leukocytes in urine but is contaminated Normal WBC in serum Radiology: ordered.  Risk Prescription drug management.   Patient does appear to have a little bit of discharge from her cervix.  However she does not have an exam consistent with PID at this time.  However given her abdominal pain without clear pathology at this point, ultrasound was obtained and we will also order CT given a negative pelvic ultrasound. Care transferred to Dr. Roxanne Mins. Given IV pain control here with moderate relief.         Final Clinical Impression(s) / ED Diagnoses Final diagnoses:  None    Rx / DC Orders ED Discharge Orders     None         Sherwood Gambler, MD 05/25/22 2347

## 2022-05-25 NOTE — ED Provider Notes (Signed)
Care assumed from Dr. Regenia Skeeter, patient with abdominal pain pending CT abdomen and pelvis. Plan to treat for possible PID if CT is negative.  Patient apparently tired of waiting for CT scan and left before I could talk with her about risks and benefits.   Delora Fuel, MD [id number] [phone]

## 2022-05-25 NOTE — ED Provider Notes (Signed)
[place]    CSN: [id number] Arrival date & time: 05/25/22  1607      History   Chief Complaint Chief Complaint  Patient presents with   Pelvic Pain    HPI Michelle Stephens is a 22 y.o. female.   HPI 22 year old female presents with pelvic pain/cramping getting worse and radiated to back after after rough sex roughly 2 days ago.  Denies vaginal bleeding, discharge and or any urinary symptoms.  Patient reports her partner tested positive for chlamydia 1 month ago but was never tested.  Patient reports trying Tylenol, Ibuprofen, and tramadol with no relief.  PMH significant for morbid obesity, ADHD, and PTSD.  Past Medical History:  Diagnosis Date   ADHD (attention deficit hyperactivity disorder)    Allergy    seasonal   Anxiety    Asthma    Depression    Headache(784.0)    Heart murmur    when a baby   JRA (juvenile rheumatoid arthritis) ([place])    Obesity    Urinary tract infection    Vision abnormalities     Patient Active Problem List   Diagnosis Date Noted   Self-inflicted injury    PTSD (post-traumatic stress disorder) 02/04/2013   ADHD (attention deficit hyperactivity disorder), combined type 02/04/2013   MDD (major depressive disorder), recurrent episode, severe 11/07/2011    Class: Diagnosis of    Past Surgical History:  Procedure Laterality Date   BREAST SURGERY     NO PAST SURGERIES      OB History     Gravida  1   Para      Term      Preterm      AB      Living  0      SAB      IAB      Ectopic      Multiple      Live Births               Home Medications    Prior to Admission medications   Medication Sig Start Date End Date Taking? Authorizing Provider  acetaminophen (TYLENOL) 500 MG tablet Take 1,000 mg by mouth every 6 (six) hours as needed for mild pain, moderate pain or headache.   Yes [provider]  sulfamethoxazole-trimethoprim (BACTRIM DS) 800-160 MG tablet Take 1 tablet by mouth 2  (two) times daily for 3 days. 05/25/22 05/28/22 Yes Eliezer Lofts, FNP    Family History Family History  Problem Relation Age of Onset   Asthma Mother    Diabetes Mother    Hypertension Mother    Depression Mother    Mental illness Mother     Social History Social History   Tobacco Use   Smoking status: Former    Packs/day: .25    Types: Cigarettes   Smokeless tobacco: Former  Scientific laboratory technician Use: Every day   Substances: Nicotine, Flavoring  Substance Use Topics   Alcohol use: Not Currently   Drug use: Yes    Types: Marijuana     Allergies   Coconut flavor and Penicillins   Review of Systems Review of Systems  Genitourinary:  Positive for pelvic pain.  All other systems reviewed and are negative.    Physical Exam Triage Vital Signs ED Triage Vitals  Enc Vitals Group     BP 05/25/22 1705 (!) 146/96     Pulse Rate 05/25/22 1705 76     Resp 05/25/22 1705  18     Temp 05/25/22 1705 98.4 F (36.9 C)     Temp Source 05/25/22 1705 Oral     SpO2 05/25/22 1705 98 %     Weight 05/25/22 1705 263 lb (119.3 kg)     Height 05/25/22 1705 5\' 5"  (1.651 m)     Head Circumference --      Peak Flow --      Pain Score 05/25/22 1703 10     Pain Loc --      Pain Edu? --      Excl. in [place]? --    No data found.  Updated Vital Signs BP (!) 146/96 (BP Location: Left Arm)   Pulse 76   Temp 98.4 F (36.9 C) (Oral)   Resp 18   Ht 5\' 5"  (1.651 m)   Wt 263 lb (119.3 kg)   LMP 05/08/2022 (Approximate)   SpO2 98%   BMI 43.77 kg/m       Physical Exam Vitals and nursing note reviewed.  Constitutional:      Appearance: Normal appearance. She is obese.  HENT:     Head: Normocephalic and atraumatic.     Mouth/Throat:     Mouth: Mucous membranes are moist.     Pharynx: Oropharynx is clear.  Eyes:     Conjunctiva/sclera: Conjunctivae normal.     Pupils: Pupils are equal, round, and reactive to light.  Cardiovascular:     Rate and Rhythm: Normal rate and regular  rhythm.     Pulses: Normal pulses.     Heart sounds: Normal heart sounds.  Pulmonary:     Effort: Pulmonary effort is normal.     Breath sounds: Normal breath sounds. No wheezing, rhonchi or rales.  Abdominal:     Comments: Patient reporting pelvic pain for the past 3 days.  Musculoskeletal:        General: Normal range of motion.     Cervical back: Normal range of motion and neck supple.  Skin:    General: Skin is warm and dry.  Neurological:     General: No focal deficit present.     Mental Status: She is alert and oriented to person, place, and time. Mental status is at baseline.  Psychiatric:     Comments: Crying, tearful      UC Treatments / Results  Labs (all labs ordered are listed, but only abnormal results are displayed) Labs Reviewed  POCT URINALYSIS DIPSTICK, ED / UC - Abnormal; Notable for the following components:      Result Value   Leukocytes,Ua TRACE (*)    All other components within normal limits  URINE CULTURE  POC URINE PREG, ED  CERVICOVAGINAL ANCILLARY ONLY    EKG   Radiology No results found.  Procedures Procedures (including critical care time)  Medications Ordered in UC Medications - No data to display  Initial Impression / Assessment and Plan / UC Course  I have reviewed the triage vital signs and the nursing notes.  Pertinent labs & imaging results that were available during my care of the patient were reviewed by me and considered in my medical decision making (see chart for details).     MDM: 1.  Pelvic pain in female-Aptima swab ordered, 2.  Acute cystitis without hematuria UA revealed trace leukocytes, Rx'd Bactrim. Advised patient to take medication as directed with food to completion.  Encouraged patient to increase daily water intake to 64 ounces per day while taking this medication.  Advised we  will follow-up with urine culture and Aptima swab results once received.  Advised patient to go to [facility] ED now for further  evaluation of pelvic pain and possible pelvic ultrasound.  Patient discharged to ED Final Clinical Impressions(s) / UC Diagnoses   Final diagnoses:  Pelvic pain in female  Acute cystitis without hematuria     Discharge Instructions      Advised patient to take medication as directed with food to completion.  Encouraged patient to increase daily water intake to 64 ounces per day while taking this medication.  Advised we will follow-up with urine culture and Aptima swab results once received.  Advised patient to go to [facility] ED now for further evaluation and possible pelvic ultrasound.     ED Prescriptions     Medication Sig Dispense Auth. Provider   sulfamethoxazole-trimethoprim (BACTRIM DS) 800-160 MG tablet Take 1 tablet by mouth 2 (two) times daily for 3 days. 6 tablet Eliezer Lofts, FNP      PDMP not reviewed this encounter.   Eliezer Lofts, [place] 05/25/22 1750

## 2022-05-25 NOTE — ED Triage Notes (Addendum)
Patient presenting with pelvic pain/cramping that has been getting worse and radiating to the back after some rough sex onset 3 days. No vaginal bleeding or discharge. No urinary symptoms.  States her partner tested positive for chlamydia 1 month ago but they had used protection.  Patient has tried tylenol, ibuprofen, tramadol with no relief.

## 2022-05-25 NOTE — ED Triage Notes (Addendum)
Pt c/o pelvic pain "all over" that radiates to back and legs x 3 days. Pt describes as contractions. Pt sent for pelvic US by UC.   No relief with tylenol, ibuprofen, tramadol, heat

## 2022-05-26 ENCOUNTER — Emergency Department (HOSPITAL_COMMUNITY): Payer: Self-pay

## 2022-05-26 LAB — HIV ANTIBODY (ROUTINE TESTING W REFLEX): HIV Screen 4th Generation wRfx: NONREACTIVE

## 2022-05-26 LAB — URINE CULTURE

## 2022-05-26 NOTE — ED Notes (Addendum)
Pt. Calls nurse in stating that she's been waiting for hours on her CT. Pt. States that she wants to just leave. She requests for her IV to be taken out because she is feeling less pain. Risk of leaving AMA were reviewed with. Pt. Accepted all risk of leaving AMA. IV removed. Pt. Left ED without signing [place] paperwork.

## 2022-05-27 ENCOUNTER — Telehealth (HOSPITAL_COMMUNITY): Payer: Self-pay | Admitting: Emergency Medicine

## 2022-05-27 LAB — CERVICOVAGINAL ANCILLARY ONLY
Bacterial Vaginitis (gardnerella): POSITIVE — AB
Candida Glabrata: NEGATIVE
Candida Vaginitis: NEGATIVE
Chlamydia: POSITIVE — AB
Comment: NEGATIVE
Comment: NEGATIVE
Comment: NEGATIVE
Comment: NEGATIVE
Comment: NEGATIVE
Comment: NORMAL
Neisseria Gonorrhea: NEGATIVE
Trichomonas: NEGATIVE

## 2022-05-27 LAB — GC/CHLAMYDIA PROBE AMP (~~LOC~~) NOT AT ARMC
Comment: NEGATIVE
Comment: NORMAL

## 2022-05-27 MED ORDER — DOXYCYCLINE HYCLATE 100 MG PO CAPS: 100.0000 mg | ORAL_CAPSULE | Freq: Two times a day (BID) | ORAL | 0 refills | Status: AC

## 2022-05-27 MED ORDER — METRONIDAZOLE 0.75 % VA GEL: 1.0000 | Freq: Every day | VAGINAL | 0 refills | Status: AC

## 2023-02-26 NOTE — L&D Delivery Note (Incomplete Revision)
 Delivery Note Michelle Stephens is a 23 y.o. G4P0030 at [redacted]w[redacted]d admitted for FGR.   GBS Status:  Positive/-- (12/02 1639)  Labor course: Initial SVE: LTC. Augmentation with: AROM, Pitocin , and IP Foley. She then progressed to complete.  ROM: 10h 41m with clear fluid  Birth: After a 1 push 2nd stage, she delivered a Live born female  Birth Weight:   APGAR: 8, 9  Newborn Delivery   Birth date/time: 02/05/2025 22:19:54 Delivery type: Vaginal, Spontaneous        Delivered via spontaneous vaginal delivery (Presentation: OA ). Nuchal cord present: No. . Shoulders and body delivered in usual fashion. Infant placed directly on mom's abdomen for bonding/skin-to-skin, baby dried and stimulated. Cord clamped x 2 after 1 minute and cut by FOB.  Cord blood collected. Placenta delivered-Spontaneous with 2 vessels. 20u Pitocin  in 500cc LR given as a bolus prior to delivery of placenta.  Fundus firm with massage. Placenta inspected and appears to be intact with a 3 VC.  Sponge and instrument count were correct x2.  Intrapartum complications:  None Anesthesia:  epidural Lacerations:  none Suture Repair: N/A EBL (mL):103.00   Newborn Data: Birth date:02/05/2025 Birth time:10:19 PM Gender:Female Living status:Living Apgars:8 ,9  Weight:     Mom to postpartum.  Baby to NICU. Placenta to Pathology for FGR   Plans to Breastfeed Contraception: unsure Circumcision: N/A  Note sent to [facility]: [facility] for pp visit.  Delivery Report:  Review the Delivery Report for details.     Signed: Cathlean Ely, DNP,CNM 02/05/2024, 11:00 PM

## 2023-02-26 NOTE — L&D Delivery Note (Signed)
 Delivery Note Michelle Stephens is a 23 y.o. G4P0030 at [redacted]w[redacted]d admitted for FGR.   GBS Status:  Positive/-- (12/02 1639)  Labor course: Initial SVE: LTC. Augmentation with: AROM, Pitocin , and IP Foley. She then progressed to complete.  ROM: 10h 41m with clear fluid  Birth: After a 1 push 2nd stage, she delivered a Live born female  Birth Weight:   APGAR: 8, 9  Newborn Delivery   Birth date/time: 02/05/2025 22:19:54 Delivery type: Vaginal, Spontaneous        Delivered via spontaneous vaginal delivery (Presentation: OA ). Nuchal cord present: No. . Shoulders and body delivered in usual fashion. Infant placed directly on mom's abdomen for bonding/skin-to-skin, baby dried and stimulated. Cord clamped x 2 after 1 minute and cut by FOB.  Cord blood collected. Placenta delivered-Spontaneous with 2 vessels. 20u Pitocin  in 500cc LR given as a bolus prior to delivery of placenta.  Fundus firm with massage. Placenta inspected and appears to be intact with a 3 VC.  Sponge and instrument count were correct x2.  Intrapartum complications:  None Anesthesia:  epidural Lacerations:  none Suture Repair: N/A EBL (mL):103.00   Newborn Data: Birth date:02/05/2025 Birth time:10:19 PM Gender:Female Living status:Living Apgars:8 ,9  Weight:     Mom to postpartum.  Baby to NICU. Placenta to Pathology for FGR   Plans to Breastfeed Contraception: unsure Circumcision: N/A  Note sent to [facility]: [facility] for pp visit.  Delivery Report:  Review the Delivery Report for details.     Signed: Cathlean Ely, DNP,CNM 02/05/2024, 11:00 PM

## 2023-08-05 ENCOUNTER — Encounter (HOSPITAL_COMMUNITY): Payer: Self-pay | Admitting: *Deleted

## 2023-08-05 ENCOUNTER — Inpatient Hospital Stay (HOSPITAL_COMMUNITY)
Admission: AD | Admit: 2023-08-05 | Discharge: 2023-08-05 | Discharge status: Against Medical Advice | Payer: MEDICAID | Attending: Obstetrics and Gynecology | Admitting: Obstetrics and Gynecology

## 2023-08-05 DIAGNOSIS — N760 Acute vaginitis: Secondary | ICD-10-CM | POA: Diagnosis not present

## 2023-08-05 DIAGNOSIS — R109 Unspecified abdominal pain: Secondary | ICD-10-CM | POA: Diagnosis present

## 2023-08-05 DIAGNOSIS — B9689 Other specified bacterial agents as the cause of diseases classified elsewhere: Secondary | ICD-10-CM | POA: Insufficient documentation

## 2023-08-05 DIAGNOSIS — Z5329 Procedure and treatment not carried out because of patient's decision for other reasons: Secondary | ICD-10-CM | POA: Diagnosis not present

## 2023-08-05 LAB — CBC
HCT: 35.1 % — ABNORMAL LOW (ref 36.0–46.0)
Hemoglobin: 11.8 g/dL — ABNORMAL LOW (ref 12.0–15.0)
MCH: 26.8 pg (ref 26.0–34.0)
MCHC: 33.6 g/dL (ref 30.0–36.0)
MCV: 79.6 fL — ABNORMAL LOW (ref 80.0–100.0)
Platelets: 209 10*3/uL (ref 150–400)
RBC: 4.41 MIL/uL (ref 3.87–5.11)
RDW: 13.2 % (ref 11.5–15.5)
WBC: 6.4 10*3/uL (ref 4.0–10.5)
nRBC: 0 % (ref 0.0–0.2)

## 2023-08-05 LAB — URINALYSIS, ROUTINE W REFLEX MICROSCOPIC
Bilirubin Urine: NEGATIVE
Glucose, UA: NEGATIVE mg/dL
Hgb urine dipstick: NEGATIVE
Ketones, ur: NEGATIVE mg/dL
Leukocytes,Ua: NEGATIVE
Nitrite: NEGATIVE
Protein, ur: NEGATIVE mg/dL
Specific Gravity, Urine: 1.024 (ref 1.005–1.030)
pH: 6 (ref 5.0–8.0)

## 2023-08-05 LAB — POCT PREGNANCY, URINE: Preg Test, Ur: POSITIVE — AB

## 2023-08-05 LAB — WET PREP, GENITAL
Clue Cells Wet Prep HPF POC: NONE SEEN
Sperm: NONE SEEN
Trich, Wet Prep: NONE SEEN
WBC, Wet Prep HPF POC: <10 (ref <10)
Yeast Wet Prep HPF POC: NONE SEEN

## 2023-08-05 LAB — HCG, QUANTITATIVE, PREGNANCY: hCG, Beta Chain, Quant, S: 55521 m[IU]/mL — ABNORMAL HIGH (ref <5)

## 2023-08-05 NOTE — MAU Note (Signed)
 Pt wanting to leave. Will sign AMA

## 2023-08-05 NOTE — MAU Note (Signed)
 Michelle Stephens is a 23 y.o. at Unknown here in MAU reporting: has had sharp pains in her lower abd that started 3 days ago. Denies any vag bleeding or discharge.  Has been treated for syphilis the last 3 weeks. Also being treated for BV.  LMP: 05/07/2023 Onset of complaint: 3 days Pain score: 7 Vitals:   08/05/23 1426  BP: 123/68  Pulse: 76  Resp: 18  Temp: 98.3 F (36.8 C)     FHT: 166  Lab orders placed from triage: u/a ,

## 2023-08-06 ENCOUNTER — Inpatient Hospital Stay (HOSPITAL_COMMUNITY)
Admission: AD | Admit: 2023-08-06 | Discharge: 2023-08-06 | Disposition: A | Discharge status: Home / Self Care | Payer: MEDICAID | Attending: Obstetrics and Gynecology | Admitting: Obstetrics and Gynecology

## 2023-08-06 ENCOUNTER — Encounter (HOSPITAL_COMMUNITY): Payer: Self-pay | Admitting: Obstetrics and Gynecology

## 2023-08-06 DIAGNOSIS — K5901 Slow transit constipation: Secondary | ICD-10-CM

## 2023-08-06 DIAGNOSIS — F17290 Nicotine dependence, other tobacco product, uncomplicated: Secondary | ICD-10-CM | POA: Diagnosis not present

## 2023-08-06 DIAGNOSIS — O99611 Diseases of the digestive system complicating pregnancy, first trimester: Secondary | ICD-10-CM

## 2023-08-06 DIAGNOSIS — K5900 Constipation, unspecified: Secondary | ICD-10-CM | POA: Diagnosis present

## 2023-08-06 DIAGNOSIS — B9689 Other specified bacterial agents as the cause of diseases classified elsewhere: Secondary | ICD-10-CM | POA: Insufficient documentation

## 2023-08-06 DIAGNOSIS — O99331 Smoking (tobacco) complicating pregnancy, first trimester: Secondary | ICD-10-CM | POA: Diagnosis not present

## 2023-08-06 DIAGNOSIS — O23591 Infection of other part of genital tract in pregnancy, first trimester: Secondary | ICD-10-CM | POA: Diagnosis not present

## 2023-08-06 DIAGNOSIS — Z3A12 12 weeks gestation of pregnancy: Secondary | ICD-10-CM

## 2023-08-06 DIAGNOSIS — R1033 Periumbilical pain: Secondary | ICD-10-CM | POA: Diagnosis present

## 2023-08-06 DIAGNOSIS — K219 Gastro-esophageal reflux disease without esophagitis: Secondary | ICD-10-CM | POA: Diagnosis not present

## 2023-08-06 HISTORY — DX: Dermatitis, unspecified: L30.9

## 2023-08-06 HISTORY — DX: Gastro-esophageal reflux disease without esophagitis: K21.9

## 2023-08-06 LAB — GC/CHLAMYDIA PROBE AMP (~~LOC~~) NOT AT ARMC
Chlamydia: NEGATIVE
Comment: NEGATIVE
Comment: NORMAL
Neisseria Gonorrhea: NEGATIVE

## 2023-08-06 MED ORDER — SENNA 8.6 MG PO TABS: 2.0000 | ORAL_TABLET | Freq: Every day | ORAL | 0 refills | Status: AC | PRN

## 2023-08-06 MED ORDER — DICYCLOMINE HCL 10 MG PO CAPS: 10.0000 mg | ORAL_CAPSULE | Freq: Three times a day (TID) | ORAL | 0 refills | Status: DC | PRN

## 2023-08-06 MED ORDER — FAMOTIDINE 20 MG PO TABS: 20.0000 mg | ORAL_TABLET | Freq: Two times a day (BID) | ORAL | 0 refills | Status: DC

## 2023-08-06 NOTE — Discharge Instructions (Signed)

## 2023-08-06 NOTE — MAU Note (Addendum)
 Michelle Stephens is a 23 y.o. at [redacted]w[redacted]d here in MAU reporting: was here yesterday, had to leave AMA because of ride. sharp pain under belly button area on Monday, then spread  to left side up underneath her titty, is in ribs, under boob. Is a sharp pain, lightening bolt. (Allover left side of belly and chest). More uncomfortable than anything. Some nausea. Slight constipation. Has GERD. At times feels like she can't take a deep breath.  Currently being treated for BV.  Onset of complaint: Monday Pain score: 6 Vitals:   08/06/23 0935  BP: 121/69  Pulse: 96  Resp: 18  Temp: 98.4 F (36.9 C)  SpO2: 100%     FHT:161 Lab orders placed from triage:      Had wings at 0200, says this pain is not like her GERD  Dating based on 6 wk US 

## 2023-08-06 NOTE — MAU Provider Note (Signed)
 Chief Complaint: Abdominal Pain   Event Date/Time   First Provider Initiated Contact with Patient 08/06/23 0957      SUBJECTIVE HPI: Michelle Stephens is a 23 y.o. G4P0030 at [redacted]w[redacted]d by early ultrasound who presents to maternity admissions reporting abdominal pain.  Patient started to have sharp pain under her bellybutton on Monday.  Now has migrated up to the left side of her abdomen extending up under the ribs.  Comes and goes.  Is not related to movement or eating.  Describes it as a lightning like sharp pain.  Does note acid reflux which has been bothering her.  Feels constipated as well.  Currently being treated for BV.  Denies vaginal bleeding, loss of fluid, urinary symptoms, fever/chills, nausea/vomiting.  Additionally had to leave AMA yesterday for a ride but did have labs, culture, urine done.  She had a ultrasound for dating at 6 weeks.  HPI  Past Medical History:  Diagnosis Date   ADHD (attention deficit hyperactivity disorder)    Allergy    seasonal   Anxiety    Asthma    when young   Depression    Eczema    GERD (gastroesophageal reflux disease)    Headache(784.0)    Heart murmur    when a baby   JRA (juvenile rheumatoid arthritis) ([facility])    Obesity    Urinary tract infection    Vision abnormalities    Past Surgical History:  Procedure Laterality Date   NO PAST SURGERIES     Social History   Socioeconomic History   Marital status: Significant Other    Spouse name: Not on file   Number of children: Not on file   Years of education: Not on file   Highest education level: Not on file  Occupational History   Occupation: Dentist: UNEMPLOYED    Comment: 7th grade at [employer]  Tobacco Use   Smoking status: Former    Current packs/day: 0.25    Types: Cigarettes   Smokeless tobacco: Former  Building services engineer status: Every Day   Substances: Nicotine , Flavoring  Substance and Sexual Activity   Alcohol use: Not Currently   Drug  use: Not Currently    Types: Marijuana   Sexual activity: Yes    Birth control/protection: None  Other Topics Concern   Not on file  Social History Narrative   Not on file   Social Drivers of Health   Financial Resource Strain: Not on file  Food Insecurity: Not on file  Transportation Needs: Not on file  Physical Activity: Not on file  Stress: Not on file  Social Connections: Not on file  Intimate Partner Violence: Not on file   No current facility-administered medications on file prior to encounter.   Current Outpatient Medications on File Prior to Encounter  Medication Sig Dispense Refill   calcium carbonate (TUMS - DOSED IN MG ELEMENTAL CALCIUM) 500 MG chewable tablet Chew 1 tablet by mouth as needed for indigestion or heartburn.     metroNIDAZOLE  (FLAGYL ) 500 MG tablet Take 500 mg by mouth 2 (two) times daily.     Prenatal Vit-Fe Fumarate-FA (MULTIVITAMIN-PRENATAL) 27-0.8 MG TABS tablet Take 1 tablet by mouth daily at 12 noon.     acetaminophen  (TYLENOL ) 500 MG tablet Take 1,000 mg by mouth every 6 (six) hours as needed for mild pain, moderate pain or headache.     Allergies  Allergen Reactions   Coconut Flavoring Agent (Non-Screening) Hives  ROS:  Pertinent positives/negatives listed above.  I have reviewed patient's Past Medical Hx, Surgical Hx, Family Hx, Social Hx, medications and allergies.   Physical Exam  Patient Vitals for the past 24 hrs:  BP Temp Temp src Pulse Resp SpO2 Height Weight  08/06/23 0935 121/69 98.4 F (36.9 C) Oral 96 18 100 % 5' 5 (1.651 m) 109.4 kg   Constitutional: Well-developed, well-nourished female in no acute distress.  Cardiovascular: normal rate Respiratory: normal effort GI: Abd soft, mildly tender in epigastric/left upper quadrant region.  Otherwise nontender, nondistended, no guarding/rebound.  She does have a stool burden palpable in left lower quadrant MS: Extremities nontender, no edema, normal ROM Neurologic: Alert and  oriented x 4 GU: Neg CVAT  Doppler: 161  LAB RESULTS Results for orders placed or performed during the hospital encounter of 08/05/23 (from the past 24 hours)  Pregnancy, urine POC     Status: Abnormal   Collection Time: 08/05/23  2:16 PM  Result Value Ref Range   Preg Test, Ur POSITIVE (A) NEGATIVE  Urinalysis, Routine w reflex microscopic -Urine, Clean Catch     Status: Abnormal   Collection Time: 08/05/23  2:25 PM  Result Value Ref Range   Color, Urine YELLOW YELLOW   APPearance HAZY (A) CLEAR   Specific Gravity, Urine 1.024 1.005 - 1.030   pH 6.0 5.0 - 8.0   Glucose, UA NEGATIVE NEGATIVE mg/dL   Hgb urine dipstick NEGATIVE NEGATIVE   Bilirubin Urine NEGATIVE NEGATIVE   Ketones, ur NEGATIVE NEGATIVE mg/dL   Protein, ur NEGATIVE NEGATIVE mg/dL   Nitrite NEGATIVE NEGATIVE   Leukocytes,Ua NEGATIVE NEGATIVE  CBC     Status: Abnormal   Collection Time: 08/05/23  2:52 PM  Result Value Ref Range   WBC 6.4 4.0 - 10.5 K/uL   RBC 4.41 3.87 - 5.11 MIL/uL   Hemoglobin 11.8 (L) 12.0 - 15.0 g/dL   HCT 81.1 (L) 91.4 - 78.2 %   MCV 79.6 (L) 80.0 - 100.0 fL   MCH 26.8 26.0 - 34.0 pg   MCHC 33.6 30.0 - 36.0 g/dL   RDW 95.6 21.3 - 08.6 %   Platelets 209 150 - 400 K/uL   nRBC 0.0 0.0 - 0.2 %  hCG, quantitative, pregnancy     Status: Abnormal   Collection Time: 08/05/23  2:52 PM  Result Value Ref Range   hCG, Beta Chain, Quant, S 55,521 (H) <5 mIU/mL  Wet prep, genital     Status: None   Collection Time: 08/05/23  3:18 PM  Result Value Ref Range   Yeast Wet Prep HPF POC NONE SEEN NONE SEEN   Trich, Wet Prep NONE SEEN NONE SEEN   Clue Cells Wet Prep HPF POC NONE SEEN NONE SEEN   WBC, Wet Prep HPF POC <10 <10   Sperm NONE SEEN        IMAGING No results found.  MAU Management/MDM: Orders Placed This Encounter  Procedures   Discharge patient    Meds ordered this encounter  Medications   famotidine (PEPCID) 20 MG tablet    Sig: Take 1 tablet (20 mg total) by mouth 2 (two)  times daily.    Dispense:  60 tablet    Refill:  0   senna (SENOKOT) 8.6 MG TABS tablet    Sig: Take 2 tablets (17.2 mg total) by mouth daily as needed for mild constipation.    Dispense:  60 tablet    Refill:  0   dicyclomine (BENTYL)  10 MG capsule    Sig: Take 1 capsule (10 mg total) by mouth 3 (three) times daily as needed (abdominal spasms).    Dispense:  30 capsule    Refill:  0    Patient presents with sharp, intermittent abdominal pain on her left side at [redacted]w[redacted]d.  She has concerns that she may be miscarrying.  I discussed baby has a normal heart rate at this time, and she does not have signs of a miscarriage, such as vaginal bleeding, lower abdominal pain/cramping.  She was reassured by this.  I do suspect her symptoms are mostly from bowel spasms.  She does not have any elevation in her WBC or peritoneal signs to suggest an acute process in her abdomen.  Discussed that we will treat her reflux with Pepcid as well as constipation with senna.  She additionally can take Bentyl before meals as needed.  She has an initial OB intake on 6/18 and will be able to follow-up to see if these conservative measures have helped at that time.  ASSESSMENT 1. Gastroesophageal reflux in pregnancy   2. Slow transit constipation   3. [redacted] weeks gestation of pregnancy     PLAN Discharge home with strict return precautions. Allergies as of 08/06/2023       Reactions   Coconut Flavoring Agent (non-screening) Hives        Medication List     TAKE these medications    acetaminophen  500 MG tablet Commonly known as: TYLENOL  Take 1,000 mg by mouth every 6 (six) hours as needed for mild pain, moderate pain or headache.   calcium carbonate 500 MG chewable tablet Commonly known as: TUMS - dosed in mg elemental calcium Chew 1 tablet by mouth as needed for indigestion or heartburn.   dicyclomine 10 MG capsule Commonly known as: BENTYL Take 1 capsule (10 mg total) by mouth 3 (three) times daily as  needed (abdominal spasms).   famotidine 20 MG tablet Commonly known as: Pepcid Take 1 tablet (20 mg total) by mouth 2 (two) times daily.   metroNIDAZOLE  500 MG tablet Commonly known as: FLAGYL  Take 500 mg by mouth 2 (two) times daily.   multivitamin-prenatal 27-0.8 MG Tabs tablet Take 1 tablet by mouth daily at 12 noon.   senna 8.6 MG Tabs tablet Commonly known as: SENOKOT Take 2 tablets (17.2 mg total) by mouth daily as needed for mild constipation.         Authur Leghorn, MD OB Fellow 08/06/2023  11:00 AM

## 2023-08-14 LAB — OB RESULTS CONSOLE RPR: RPR: REACTIVE

## 2023-08-14 LAB — HEPATITIS C ANTIBODY: HCV Ab: NEGATIVE

## 2023-08-14 LAB — OB RESULTS CONSOLE ABO/RH: RH Type: POSITIVE

## 2023-08-14 LAB — OB RESULTS CONSOLE HGB/HCT, BLOOD
HCT: 36 (ref 29–41)
Hemoglobin: 11.5

## 2023-08-14 LAB — OB RESULTS CONSOLE RUBELLA ANTIBODY, IGM: Rubella: NON-IMMUNE/NOT IMMUNE

## 2023-08-14 LAB — OB RESULTS CONSOLE HEPATITIS B SURFACE ANTIGEN: Hepatitis B Surface Ag: NEGATIVE

## 2023-08-14 LAB — OB RESULTS CONSOLE ANTIBODY SCREEN: Antibody Screen: NEGATIVE

## 2023-08-14 LAB — OB RESULTS CONSOLE HIV ANTIBODY (ROUTINE TESTING): HIV: NONREACTIVE

## 2023-08-14 LAB — OB RESULTS CONSOLE PLATELET COUNT: Platelets: 203

## 2023-08-19 ENCOUNTER — Other Ambulatory Visit: Payer: Self-pay | Admitting: Obstetrics and Gynecology

## 2023-08-19 DIAGNOSIS — O99210 Obesity complicating pregnancy, unspecified trimester: Secondary | ICD-10-CM

## 2023-08-19 LAB — OB RESULTS CONSOLE GC/CHLAMYDIA
Chlamydia: NEGATIVE
Neisseria Gonorrhea: NEGATIVE

## 2023-08-19 LAB — CT NG TV HSV BY NAA: Chlamydia, Swab/Urine, PCR: NEGATIVE

## 2023-08-20 LAB — LAB REPORT - SCANNED
A1c: 5.1
Creatinine, POC: 208.1 mg/dL
HM HIV Screening: NEGATIVE
HM Hepatitis Screen: NEGATIVE

## 2023-09-23 ENCOUNTER — Ambulatory Visit: Payer: MEDICAID | Admitting: Advanced Practice Midwife

## 2023-09-23 ENCOUNTER — Other Ambulatory Visit: Payer: Self-pay

## 2023-09-23 ENCOUNTER — Encounter: Payer: Self-pay | Admitting: Family Medicine

## 2023-09-23 VITALS — BP 136/89 | HR 85 | Wt 239.4 lb

## 2023-09-23 DIAGNOSIS — F1120 Opioid dependence, uncomplicated: Secondary | ICD-10-CM | POA: Diagnosis not present

## 2023-09-23 DIAGNOSIS — F1910 Other psychoactive substance abuse, uncomplicated: Secondary | ICD-10-CM

## 2023-09-23 DIAGNOSIS — Z8619 Personal history of other infectious and parasitic diseases: Secondary | ICD-10-CM

## 2023-09-23 DIAGNOSIS — O99322 Drug use complicating pregnancy, second trimester: Secondary | ICD-10-CM | POA: Diagnosis not present

## 2023-09-23 DIAGNOSIS — Z1331 Encounter for screening for depression: Secondary | ICD-10-CM

## 2023-09-23 DIAGNOSIS — F5104 Psychophysiologic insomnia: Secondary | ICD-10-CM

## 2023-09-23 DIAGNOSIS — O98119 Syphilis complicating pregnancy, unspecified trimester: Secondary | ICD-10-CM | POA: Insufficient documentation

## 2023-09-23 DIAGNOSIS — Z2839 Other underimmunization status: Secondary | ICD-10-CM

## 2023-09-23 DIAGNOSIS — Z1332 Encounter for screening for maternal depression: Secondary | ICD-10-CM

## 2023-09-23 DIAGNOSIS — O0992 Supervision of high risk pregnancy, unspecified, second trimester: Secondary | ICD-10-CM

## 2023-09-23 DIAGNOSIS — F1121 Opioid dependence, in remission: Secondary | ICD-10-CM | POA: Insufficient documentation

## 2023-09-23 DIAGNOSIS — Z3A19 19 weeks gestation of pregnancy: Secondary | ICD-10-CM

## 2023-09-23 DIAGNOSIS — O09892 Supervision of other high risk pregnancies, second trimester: Secondary | ICD-10-CM

## 2023-09-23 DIAGNOSIS — O0990 Supervision of high risk pregnancy, unspecified, unspecified trimester: Secondary | ICD-10-CM | POA: Insufficient documentation

## 2023-09-23 NOTE — Progress Notes (Incomplete)
 Subjective:  Michelle Stephens is a 23 y.o. G4P0030 at [redacted]w[redacted]d being seen today for ongoing prenatal care. Initiated OB care at [facility]. Wishes to transfer care completely to [facility] REACH clinic. Records reviewed. She is currently monitored for the following issues for this high-risk pregnancy and has MDD (major depressive disorder), recurrent episode, severe; PTSD (post-traumatic stress disorder); ADHD (attention deficit hyperactivity disorder), combined type; Self-inflicted injury; Supervision of high risk pregnancy, antepartum; Opioid use disorder, severe, dependence (HCC); and Substance abuse affecting pregnancy, antepartum (HCC) on their problem list.  Reports snorting Fentanyl about every 2 hours. Last dose of fentanyl about 3 hours ago. Denies IVDU or sharing straws. Interesting in Subutex induction. Tried Suboxone in the past and didn't have a good experience. Not interested in Methadone mostly due to transportation barriers. UDS at [facility] June 2025 + Cocaine, MJ but not fentanyl, but test was only for opiates so it would not have picked up Fentanyl. Pt very open about HX substance use but states she didn't not know where the cocaine came from.   Recent Syphilis. Reports recent Tx. RPR 1:1 in June 2025.   Patient reports runny nose, feeling diaphoretic and cold, mild stomach pain. Has struggled with severe, chronic insomnia and feels like it is a trigger for her substance use. Has been on numerous meds since she was 13. Ambien  is the only thing that has ben helpful. Not currently taking anything.   Contractions: Not present. Vag. Bleeding: None.   . Denies leaking of fluid.   The following portions of the patient's history were reviewed and updated as appropriate: allergies, current medications, past family history, past medical history, past social history, past surgical history and problem list. Problem list updated.  Objective:   Vitals:   09/23/23 1539  BP: 136/89  Pulse: 85  Weight: 239 lb  6.4 oz (108.6 kg)    Fetal Status: Fetal Heart Rate (bpm): 160 Fundal Height: 20 cm       General:  Alert, oriented and cooperative. Patient is in no acute distress.  Skin: Skin is warm and dry. No rash noted.   Cardiovascular: Normal heart rate noted  Respiratory: Normal respiratory effort, no problems with respiration noted  Abdomen: Soft, gravid, appropriate for gestational age. Pain/Pressure: Absent     Pelvic: Vag. Bleeding: None     Cervical exam deferred        Extremities: Normal range of motion.  Edema: None  Mental Status: Normal mood and affect. Normal behavior. Normal judgment and thought content.   Urinalysis:      PDMP reviewed during this encounter.   Last UDS:   Assessment and Plan:  Pregnancy: G4P0030 at [redacted]w[redacted]d  1. Supervision of high risk pregnancy, antepartum (Primary) - ToxAssure Flex 15, Ur - AFP, Serum, Open Spina Bifida; Future   2. [redacted] weeks gestation of pregnancy - ToxAssure Flex 15, Ur - AFP, Serum, Open Spina Bifida; Future  3. Opioid use disorder, severe, dependence (HCC) - ToxAssure Flex 15, Ur - Discussed R/B/I Suboxone vs Subutex vs Methadone vs abstinence vs continue illicit use including risks of OD, PTD, NAS. Pt is motivated to start Subutex but lives alone in a hotel and is concerned about the safety of induction without anyone with her for safety and support. Dr. Lola in to see pt. Offered MAU Subutex induction. Pt prefers that option but needs to do it tomorrow instead of today. Instructed to go as long as possible without using Opiates beforehand. Explained induction process and  what to expect. Questions addressed.   4. Substance abuse affecting pregnancy, antepartum (HCC) - ToxAssure Flex 15, Ur  5. Chronic insomnia - Recommend working through Subutex induction first for delving into insomnia. Brief discussion of importance of good sleep hygiene and that medications are a poor long-term management strategy for chronic insomnia. CNM  also concerned about pt taking multiple sedating medications simultaneously. Will revisit at [place].   6. Hx Syphilis.   Preterm labor symptoms and general obstetric precautions including but not limited to vaginal bleeding, contractions, leaking of fluid and fetal movement were reviewed in detail with the patient. Please refer to After Visit Summary for other counseling recommendations.   Return in about 1 week (around 09/30/2023) for REACH ROB.   Future Appointments  Date Time Provider Department Center  09/30/2023  2:55 PM Claudene Blake , CNM [facility]  10/10/2023  9:00 AM [facility] PROVIDER 1 [facility]  10/10/2023  9:30 AM [facility] US6 [facility]    Total face-to-face time with patient: 45 minutes.  Over 50% of encounter was spent on counseling and coordination of care.   Julane Crock  Claudene HOWARD 09/23/2023 11:45 PM Center for [employer] [facility], [facility]

## 2023-09-23 NOTE — Progress Notes (Unsigned)
   Subjective:   Michelle Stephens is a 23 y.o. G4P0030 here today for initiation of REACH program for substance exposed newborn consult.   Health Maintenance Due  Topic Date Due   HPV VACCINES (1 - 3-dose series) Never done   DTaP/Tdap/Td (1 - Tdap) Never done   Hepatitis B Vaccines (1 of 3 - 19+ 3-dose series) Never done   Cervical Cancer Screening (Pap smear)  Never done   COVID-19 Vaccine (1 - 2024-25 season) Never done    Past Medical History:  Diagnosis Date   ADHD (attention deficit hyperactivity disorder)    Allergy    seasonal   Anxiety    Asthma    when young   Depression    Eczema    GERD (gastroesophageal reflux disease)    Headache(784.0)    Heart murmur    when a baby   JRA (juvenile rheumatoid arthritis) (HCC)    Obesity    Urinary tract infection    Vision abnormalities     Past Surgical History:  Procedure Laterality Date   WISDOM TOOTH EXTRACTION  2019    The following portions of the patient's history were reviewed and updated as appropriate: allergies, current medications, past family history, past medical history, past social history, past surgical history and problem list.     Objective:   Michelle Stephens is well appearing in no acute distress. She has linear thinking and clear communication.      Assessment and Plan:  Met with Michelle Stephens today at [facility] for initiation of substance exposed newborn consult. I introduced the REACH clinic team to Michelle Stephens and discussed the expectations of clinic and wraparound resources offered to her. Michelle Stephens shared her recent substance use history and is interested in Michelle Stephens, however stated Suboxone did nothing for me. Encourage her to discuss her concerns with Dr. Lola.  Michelle Stephens Michelle Stephens REACH Clinic flyer and shared REACH team phone number.    Encouraged Michelle Stephens to contact NAS consult phone in between appointments with any further questions or concerns.     Problem List Items Addressed This Visit        Other   Supervision of high risk pregnancy, antepartum - Primary    Routine preventative health maintenance measures emphasized. Please refer to After Visit Summary for other counseling recommendations.   No follow-ups on file.    Total face-to-face time with patient: 30 minutes.  Over 50% of encounter was spent on counseling and coordination of care.   Comer Fang, NNP-BC Neonatal Nurse Practitioner Substance Exposed Newborn Consult at the [facility] [phone]

## 2023-09-23 NOTE — Progress Notes (Unsigned)
 Subjective:  Michelle Stephens is a 23 y.o. G4P0030 at [redacted]w[redacted]d being seen today for ongoing prenatal care. Initiated OB care at [facility]. Wishes to transfer care completely to [facility] REACH clinic. Records reviewed. She is currently monitored for the following issues for this high-risk pregnancy and has MDD (major depressive disorder), recurrent episode, severe; PTSD (post-traumatic stress disorder); ADHD (attention deficit hyperactivity disorder), combined type; Self-inflicted injury; Supervision of high risk pregnancy, antepartum; Opioid use disorder, severe, dependence (HCC); and Substance abuse affecting pregnancy, antepartum (HCC) on their problem list.  Reports snorting Fentanyl about every 2 hours. Last dose of fentanyl about 3 hours ago. Denies IVDU or sharing straws. Interesting in Subutex induction. Tried Suboxone in the past and didn't have a good experience. Not interested in Methadone mostly due to transportation barriers. UDS at [facility] June 2025 + Cocaine, MJ but not fentanyl, but test was only for opiates so it would not have picked up Fentanyl. Pt very open about HX substance use but states she didn't not know where the cocaine came from.   Recent Syphilis 06/2023. Reports Tx at [facility]. Unable to see record of Tx in Epic. 08/14/2023.    Patient reports runny nose, feeling diaphoretic and cold, mild stomach pain. Has struggled with severe, chronic insomnia and feels like it is a trigger for her substance use. Has been on numerous meds since she was 13. Ambien  is the only thing that has ben helpful. Not currently taking anything.   Contractions: Not present. Vag. Bleeding: None.   . Denies leaking of fluid.   The following portions of the patient's history were reviewed and updated as appropriate: allergies, current medications, past family history, past medical history, past social history, past surgical history and problem list. Problem list updated.  Objective:   Vitals:   09/23/23 1539  BP:  136/89  Pulse: 85  Weight: 239 lb 6.4 oz (108.6 kg)    Fetal Status: Fetal Heart Rate (bpm): 160 Fundal Height: 20 cm       General:  Alert, oriented and cooperative. Patient is in no acute distress.  Skin: Skin is warm and dry. No rash noted.   Cardiovascular: Normal heart rate noted  Respiratory: Normal respiratory effort, no problems with respiration noted  Abdomen: Soft, gravid, appropriate for gestational age. Pain/Pressure: Absent     Pelvic: Vag. Bleeding: None     Cervical exam deferred        Extremities: Normal range of motion.  Edema: None  Mental Status: Normal mood and affect. Normal behavior. Normal judgment and thought content.   Urinalysis:      PDMP reviewed during this encounter.   Last UDS:    Assessment and Plan:  Pregnancy: G4P0030 at [redacted]w[redacted]d  1. Supervision of high risk pregnancy, antepartum (Primary) - ToxAssure Flex 15, Ur - AFP, Serum, Open Spina Bifida; Future  2. [redacted] weeks gestation of pregnancy - ToxAssure Flex 15, Ur - AFP, Serum, Open Spina Bifida; Future  3. Opioid use disorder, severe, dependence (HCC)--demonstrating withdrawal Sx in clinic today.  - ToxAssure Flex 15, Ur - Welcomed to [facility] clinic. Explained resources, routines. Discussed R/B/I Suboxone vs Subutex vs Methadone vs abstinence vs continue illicit use including risks of OD, PTD, NAS. Pt is motivated to start Subutex but lives alone in a hotel and is concerned about the safety of induction without anyone with her for safety and support. Dr. Lola in to see pt. Offered MAU Subutex induction. Pt prefers that option but needs to do  it tomorrow instead of today. Instructed to go as long as possible without using Opiates beforehand. Explained induction process and what to expect. Questions addressed.   4. Substance abuse affecting pregnancy, antepartum (HCC) - ToxAssure Flex 15, Ur  5. Chronic insomnia - Recommend working through Subutex induction first for delving into insomnia.  Brief discussion of importance of good sleep hygiene and that medications are a poor long-term management strategy for chronic insomnia. CNM also concerned about pt taking multiple sedating medications simultaneously. Will revisit at [place].   6. Hx Syphilis.  - Request Tx records from [facility] - Check titer at 28 weeks   7. Rubella nonimmune  - MMR PP  8. Positive depression screening - Denies SI/HI. Significant MH Hx. Support given. No current Tx. Brief discussion of resources including IBH as part of REACH. Emphasized pt's strengths. Consider Psych referral.   Preterm labor symptoms and general obstetric precautions including but not limited to vaginal bleeding, contractions, leaking of fluid and fetal movement were reviewed in detail with the patient. Please refer to After Visit Summary for other counseling recommendations.   Return in about 1 week (around 09/30/2023) for REACH ROB.   Future Appointments  Date Time Provider Department Center  09/30/2023  2:55 PM Claudene Sicard , CNM [facility]  10/10/2023  9:00 AM [facility] PROVIDER 1 [facility]  10/10/2023  9:30 AM [facility] US6 [facility]    Total face-to-face time with patient: 45 minutes.  Over 50% of encounter was spent on counseling and coordination of care.   Edilson Vital  Claudene HOWARD 09/23/2023 11:45 PM Center for [employer], [facility]

## 2023-09-23 NOTE — Patient Instructions (Signed)
 Instruction for starting buprenorphine-naloxone (Suboxone) at home  You should not mix buprenorphine-naloxone with other drugs especially large amounts of alcohol or benzodiazepines (Valium, Klonopin, Xanax, Ativan). If you have taken any of these medication, please tell your healthcare team and do not take buprenorphine-naloxone.   You must wait until you are feeling signs of withdrawal from opiates (heroin, pain pills, fentanyl) before you take buprenorphine-naloxone.  If you do not wait long enough the medication will make you sicker.  If you do take it too soon and get sicker then wait until later when you feel signs of withdrawal listed below and then try again.   Signs that you are withdrawing: Anxiety, restlessness, can't sit still Aches Nausea or sick to your stomach Diarrhea Goose-bumps Racing heart  Frequent yawning  You should have ALL of these symptoms before you start taking your first dose of buprenorphine-naloxone. If you are not sure call your healthcare team.    When it's time to take your first dose Take 1 to 2 films or tablets depending on what you have discussed with your doctor Make sure your mouth is empty of everything (no candy/gum/etc) Sit or stand, but do not lie down Swallow a sip of water to wet your mouth  Put the tablet(s) or film(s) under your tongue. Do not suck or swallow it. It must stay there until it is completely dissolved. Try to not even swallow your spit during this time. Anything that you swallow will not make you feel better.   In one hour: You should start feeling a little better. You can take the another pill or film the same way you took the first one if you do not feel total relief of your withdrawal symptoms.   In 2 hours: if you are still feeling symptoms of withdrawal listed above you can take another full pill or film. You can repeat this if needed until you take a total dose of up to 24 mg of Suboxone. You may need less than this to control  your symptoms.  You should adjust your daily dose so that you are are not experiencing withdrawal symptoms or cravings.   The next day:  In the morning you can take the same amount you took yesterday all at one time in the morning.  Alternatively it is OK to take slightly less (including a half of a tablet or film) if you felt too sleepy the day before. It is also OK to take the same total dose of Suboxone but spread out evenly over the day.    If you have any questions or concerns at any time call your healthcare team.  Office Number:  [phone]

## 2023-09-24 DIAGNOSIS — Z2839 Other underimmunization status: Secondary | ICD-10-CM | POA: Insufficient documentation

## 2023-09-29 ENCOUNTER — Telehealth: Payer: Self-pay | Admitting: Family Medicine

## 2023-09-29 NOTE — Telephone Encounter (Signed)
 Please disregard this error

## 2023-09-30 ENCOUNTER — Telehealth: Payer: MEDICAID | Admitting: Advanced Practice Midwife

## 2023-09-30 DIAGNOSIS — F1910 Other psychoactive substance abuse, uncomplicated: Secondary | ICD-10-CM

## 2023-09-30 DIAGNOSIS — O0992 Supervision of high risk pregnancy, unspecified, second trimester: Secondary | ICD-10-CM | POA: Diagnosis not present

## 2023-09-30 DIAGNOSIS — O219 Vomiting of pregnancy, unspecified: Secondary | ICD-10-CM

## 2023-09-30 DIAGNOSIS — O99322 Drug use complicating pregnancy, second trimester: Secondary | ICD-10-CM

## 2023-09-30 DIAGNOSIS — O0990 Supervision of high risk pregnancy, unspecified, unspecified trimester: Secondary | ICD-10-CM

## 2023-09-30 DIAGNOSIS — Z8619 Personal history of other infectious and parasitic diseases: Secondary | ICD-10-CM

## 2023-09-30 DIAGNOSIS — Z3A20 20 weeks gestation of pregnancy: Secondary | ICD-10-CM

## 2023-09-30 DIAGNOSIS — F1120 Opioid dependence, uncomplicated: Secondary | ICD-10-CM

## 2023-09-30 MED ORDER — BUPRENORPHINE HCL 8 MG SL SUBL: 8.0000 mg | SUBLINGUAL_TABLET | Freq: Four times a day (QID) | SUBLINGUAL | 0 refills | Status: AC

## 2023-09-30 MED ORDER — ONDANSETRON 4 MG PO TBDP: 4.0000 mg | ORAL_TABLET | Freq: Three times a day (TID) | ORAL | 2 refills | Status: AC | PRN

## 2023-09-30 MED ORDER — BLOOD PRESSURE KIT DEVI: 1.0000 | Freq: Once | 0 refills | Status: AC

## 2023-09-30 MED ORDER — PRENATAL 27-0.8 MG PO TABS: 1.0000 | ORAL_TABLET | Freq: Every day | ORAL | 12 refills | Status: AC

## 2023-09-30 MED ORDER — NALOXONE HCL 4 MG/0.1ML NA LIQD: NASAL | 11 refills | Status: AC

## 2023-09-30 NOTE — Progress Notes (Signed)
   Subjective:   Michelle Stephens is a 23 y.o. G4P0030 here today for virtual visit for ongoing prenatal care, substance use disorder management and substance exposed newborn preparation.  Connected with Ixchel and confirmed correct patient with two identifiers.  Accompanied by clinic LCAS/MSW, [place].  Brieanna reports feeling nauseous for the last two days but otherwise stable.  She still desires suboxone induction but continues to have barriers preventing her from scheduling.  Health Maintenance Due  Topic Date Due   HPV VACCINES (1 - 3-dose series) Never done   DTaP/Tdap/Td (1 - Tdap) Never done   Hepatitis B Vaccines (1 of 3 - 19+ 3-dose series) Never done   Cervical Cancer Screening (Pap smear)  Never done   COVID-19 Vaccine (1 - 2024-25 season) Never done   INFLUENZA VACCINE  09/26/2023    Past Medical History:  Diagnosis Date   ADHD (attention deficit hyperactivity disorder)    Allergy    seasonal   Anxiety    Asthma    when young   Depression    Eczema    GERD (gastroesophageal reflux disease)    Headache(784.0)    Heart murmur    when a baby   JRA (juvenile rheumatoid arthritis) (HCC)    Obesity    Urinary tract infection    Vision abnormalities     Past Surgical History:  Procedure Laterality Date   WISDOM TOOTH EXTRACTION  2019    The following portions of the patient's history were reviewed and updated as appropriate: allergies, current medications, past family history, past medical history, past social history, past surgical history and problem list.     Objective:   Kyleah is well appearing in no apparent distress.  She has clear thoughts and communication.     Assessment and Plan:  Shavy will see ALONSO Sharps, CNM who is aware of Jericha's ongoing nausea with plans for management.  Encouraged Tambi to address this during her visit.  She continues to have barriers that prevent her from scheduling suboxone induction at [facility], specifically, hotel  housing that she pays for daily (REACH Team will reach out to patient assistance fund to inquire about temporary housing assistance).Aubry has the contact information for the Substance Exposed Newborn team if needs arise prior to next visit. Problem List Items Addressed This Visit       Other   Supervision of high risk pregnancy, antepartum - Primary   Relevant Medications   Blood Pressure Monitoring (BLOOD PRESSURE KIT) DEVI   Other Relevant Orders   CHL AMB BABYSCRIPTS SCHEDULE OPTIMIZATION   History of syphilis    Routine preventative health maintenance measures emphasized. Please refer to After Visit Summary for other counseling recommendations.   No follow-ups on file.    Total face-to-face time with patient: 10 minutes.  Over 50% of encounter was spent on counseling and coordination of care.   Delon LITTIE Frater, NNP-BC Neonatal Nurse Practitioner Substance Exposed Newborn Consult at the [facility] [phone]

## 2023-09-30 NOTE — Patient Instructions (Signed)
 Instruction for starting buprenorphine-naloxone (Suboxone) at home  You should not mix buprenorphine-naloxone with other drugs especially large amounts of alcohol or benzodiazepines (Valium, Klonopin, Xanax, Ativan). If you have taken any of these medication, please tell your healthcare team and do not take buprenorphine-naloxone.   You must wait until you are feeling signs of withdrawal from opiates (heroin, pain pills, fentanyl) before you take buprenorphine-naloxone.  If you do not wait long enough the medication will make you sicker.  If you do take it too soon and get sicker then wait until later when you feel signs of withdrawal listed below and then try again.   Signs that you are withdrawing: Anxiety, restlessness, can't sit still Aches Nausea or sick to your stomach Diarrhea Goose-bumps Racing heart  Frequent yawning  You should have ALL of these symptoms before you start taking your first dose of buprenorphine-naloxone. If you are not sure call your healthcare team.    When it's time to take your first dose Take 1 to 2 films or tablets depending on what you have discussed with your doctor Make sure your mouth is empty of everything (no candy/gum/etc) Sit or stand, but do not lie down Swallow a sip of water to wet your mouth  Put the tablet(s) or film(s) under your tongue. Do not suck or swallow it. It must stay there until it is completely dissolved. Try to not even swallow your spit during this time. Anything that you swallow will not make you feel better.   In one hour: You should start feeling a little better. You can take the another pill or film the same way you took the first one if you do not feel total relief of your withdrawal symptoms.   In 2 hours: if you are still feeling symptoms of withdrawal listed above you can take another full pill or film. You can repeat this if needed until you take a total dose of up to 24 mg of Suboxone. You may need less than this to control  your symptoms.  You should adjust your daily dose so that you are are not experiencing withdrawal symptoms or cravings.   The next day:  In the morning you can take the same amount you took yesterday all at one time in the morning.  Alternatively it is OK to take slightly less (including a half of a tablet or film) if you felt too sleepy the day before. It is also OK to take the same total dose of Suboxone but spread out evenly over the day.    If you have any questions or concerns at any time call your healthcare team.  Office Number:  [phone]

## 2023-10-05 ENCOUNTER — Emergency Department (HOSPITAL_COMMUNITY)
Admission: EM | Admit: 2023-10-05 | Discharge: 2023-10-05 | Disposition: A | Discharge status: Home / Self Care | Payer: MEDICAID | Attending: Emergency Medicine | Admitting: Emergency Medicine

## 2023-10-05 ENCOUNTER — Encounter (HOSPITAL_COMMUNITY): Payer: Self-pay | Admitting: Emergency Medicine

## 2023-10-05 ENCOUNTER — Other Ambulatory Visit: Payer: Self-pay

## 2023-10-05 DIAGNOSIS — Z3A21 21 weeks gestation of pregnancy: Secondary | ICD-10-CM | POA: Insufficient documentation

## 2023-10-05 DIAGNOSIS — W57XXXA Bitten or stung by nonvenomous insect and other nonvenomous arthropods, initial encounter: Secondary | ICD-10-CM | POA: Diagnosis not present

## 2023-10-05 DIAGNOSIS — J45909 Unspecified asthma, uncomplicated: Secondary | ICD-10-CM | POA: Insufficient documentation

## 2023-10-05 DIAGNOSIS — O99512 Diseases of the respiratory system complicating pregnancy, second trimester: Secondary | ICD-10-CM | POA: Insufficient documentation

## 2023-10-05 DIAGNOSIS — S80861A Insect bite (nonvenomous), right lower leg, initial encounter: Secondary | ICD-10-CM | POA: Diagnosis not present

## 2023-10-05 DIAGNOSIS — O9A212 Injury, poisoning and certain other consequences of external causes complicating pregnancy, second trimester: Secondary | ICD-10-CM | POA: Insufficient documentation

## 2023-10-05 DIAGNOSIS — O26892 Other specified pregnancy related conditions, second trimester: Secondary | ICD-10-CM | POA: Diagnosis present

## 2023-10-05 MED ORDER — LORATADINE 10 MG PO TABS
10.0000 mg | ORAL_TABLET | Freq: Once | ORAL | Status: AC
  Administered 2023-10-05: 10 mg via ORAL
  Filled 2023-10-05: qty 1

## 2023-10-05 MED ORDER — PREDNISONE 20 MG PO TABS
60.0000 mg | ORAL_TABLET | Freq: Once | ORAL | Status: AC
  Administered 2023-10-05: 60 mg via ORAL
  Filled 2023-10-05: qty 3

## 2023-10-05 NOTE — ED Notes (Signed)
Patient verbalizes understanding of discharge instructions. Opportunity for questioning and answers were provided. Armband removed by staff, pt discharged from ED. Ambulated out to lobby with boyfriend  

## 2023-10-05 NOTE — Discharge Instructions (Signed)
 Please begin utilizing hydrocortisone cream as discussed.  Please take Claritin  once a day for any kind of itching.  Follow-up with your PCP, OB/GYN team.  Return to ED with any new symptoms.

## 2023-10-05 NOTE — ED Provider Notes (Signed)
 [place] EMERGENCY DEPARTMENT AT [facility] Provider Note   CSN: [id number] Arrival date & time: 10/05/23  9793     Patient presents with: Insect Bite   Michelle Stephens is a 23 y.o. female who is [redacted] weeks pregnant.  She has a history of GERD, eczema, depression, asthma, anxiety.  She presents to ED for evaluation of bug bite to right lower extremity.  Reports that this evening she was out walking her dogs, got into her car to do [employer] when she noted that she had irritation, burning to her right lower extremity.  She believes that she was bit by a bug.  She did not see a bug crawl on her.  She is here complaining of irritation, burning to right lower extremity.  Denies fevers, nausea or vomiting.  Denies abdominal pain or vaginal bleeding.   HPI     Prior to Admission medications   Medication Sig Start Date End Date Taking? Authorizing Provider  acetaminophen  (TYLENOL ) 500 MG tablet Take 1,000 mg by mouth every 6 (six) hours as needed for mild pain, moderate pain or headache. Patient not taking: Reported on 09/30/2023    [provider]  buprenorphine  (SUBUTEX ) 8 MG SUBL SL tablet Place 1 tablet (8 mg total) under the tongue in the morning, at noon, in the evening, and at bedtime for 15 days. 09/30/23 10/15/23  Smith, [place] , CNM  calcium carbonate (TUMS - DOSED IN MG ELEMENTAL CALCIUM) 500 MG chewable tablet Chew 1 tablet by mouth as needed for indigestion or heartburn.    [provider]  dicyclomine  (BENTYL ) 10 MG capsule Take 1 capsule (10 mg total) by mouth 3 (three) times daily as needed (abdominal spasms). Patient not taking: Reported on 09/30/2023 08/06/23   Nicholaus Almarie HERO, MD  famotidine  (PEPCID ) 20 MG tablet Take 1 tablet (20 mg total) by mouth 2 (two) times daily. 08/06/23   Nicholaus Almarie HERO, MD  naloxone  (NARCAN ) nasal spray 4 mg/0.1 mL Use as needed to reverse opioid overdose 09/30/23   Smith, Virginia , CNM  ondansetron  (ZOFRAN -ODT) 4 MG  disintegrating tablet Take 1-2 tablets (4-8 mg total) by mouth every 8 (eight) hours as needed for nausea. 09/30/23   Smith, Virginia , CNM  Prenatal Vit-Fe Fumarate-FA (MULTIVITAMIN-PRENATAL) 27-0.8 MG TABS tablet Take 1 tablet by mouth daily at 12 noon. 09/30/23   Smith, Virginia , CNM  senna (SENOKOT) 8.6 MG TABS tablet Take 2 tablets (17.2 mg total) by mouth daily as needed for mild constipation. 08/06/23   Nicholaus Almarie HERO, MD    Allergies: Coconut flavoring agent (non-screening)    Review of Systems  All other systems reviewed and are negative.   Updated Vital Signs BP (!) 145/102 (BP Location: Left Arm)   Pulse 90   Temp 97.9 F (36.6 C) (Oral)   Resp 18   Ht 5' 5 (1.651 m)   Wt 111.6 kg   LMP 05/07/2023 (Exact Date)   SpO2 100%   BMI 40.94 kg/m   Physical Exam Vitals and nursing note reviewed.  Constitutional:      General: She is not in acute distress.    Appearance: She is well-developed.  HENT:     Head: Normocephalic and atraumatic.  Eyes:     Conjunctiva/sclera: Conjunctivae normal.  Cardiovascular:     Rate and Rhythm: Normal rate and regular rhythm.     Heart sounds: No murmur heard. Pulmonary:     Effort: Pulmonary effort is normal. No respiratory distress.  Breath sounds: Normal breath sounds.  Abdominal:     Palpations: Abdomen is soft.     Tenderness: There is no abdominal tenderness.  Musculoskeletal:        General: No swelling.     Cervical back: Neck supple.  Skin:    General: Skin is warm and dry.     Capillary Refill: Capillary refill takes less than 2 seconds.     Comments: To punctate areas of swelling with slight erythema to right lower extremity.  Please see photo.  Neurological:     Mental Status: She is alert.  Psychiatric:        Mood and Affect: Mood normal.     (all labs ordered are listed, but only abnormal results are displayed) Labs Reviewed - No data to display  EKG: None  Radiology: No results found.   Procedures    Medications Ordered in the ED  predniSONE  (DELTASONE ) tablet 60 mg (has no administration in time range)  loratadine  (CLARITIN ) tablet 10 mg (has no administration in time range)     Medical Decision Making  23 year old female presents for evaluation of possible bug bite to right lower extremity.  Please see HPI.  On examination, patient has 2 very small areas of slight swelling with slight erythema to her right lower extremity.  Patient was given prednisone , Claritin .  Patient was advised to purchase hydrocortisone cream and apply this as directed.  She was advised to follow-up with her PCP for further care.  Given return precautions and she voiced understanding.  Stable to discharge.    Final diagnoses:  Insect bite, unspecified site, initial encounter    ED Discharge Orders     None          Ruthell Lonni JULIANNA DEVONNA 10/05/23 0232    Raford Lenis, MD 10/05/23 [phone]

## 2023-10-05 NOTE — ED Triage Notes (Addendum)
 Pt in with reported itchiness to outer RLE, onset about 20 min ago. States she was out walking her dog and thinks she got bitten by something at that time. 2 small bug bites present to RLE, states the pain is burning. No hives present. Of note, she is [redacted]wks pregnant and reports no complications there, good fetal kicks reported

## 2023-10-06 NOTE — Progress Notes (Signed)
 Subjective:  Virtual Visit via Video Note  I connected with Carrigan R Kasper on 10/06/23 at  2:55 PM EDT by a video enabled telemedicine application and verified that I am speaking with the correct person using two identifiers.  Location: Patient: Hotel room Provider: Private exam room   I discussed the limitations of evaluation and management by telemedicine and the availability of in person appointments. The patient expressed understanding and agreed to proceed.   LAQUINTA HAZELL is a 23 y.o. G4P0030 at [redacted]w[redacted]d being seen today for ongoing prenatal care.  She is currently monitored for the following issues for this high-risk pregnancy and has MDD (major depressive disorder), recurrent episode, severe; PTSD (post-traumatic stress disorder); ADHD (attention deficit hyperactivity disorder), combined type; Self-inflicted injury; Supervision of high risk pregnancy, antepartum; Opioid use disorder, severe, dependence (HCC); Substance abuse affecting pregnancy, antepartum (HCC); History of syphilis; and Rubella non-immune status, antepartum on their problem list.  Was not able to come to [facility] for Subutex  as planned after last visit due to challenges coordinating care with needing to pay for hotel room each day by 11:00 am. Does not have enough money to pay more than one day at a time. Had voices nervousness about doing Subutex  at the hotel at last visit due to not having support person with her if needed. Now desires home induction and has a friend she call if needed. Snorting Fentanyl Q2-3 hours.   Patient reports no complaints.  Contractions: Not present. Vag. Bleeding: None.  Movement: Present. Denies leaking of fluid.   The following portions of the patient's history were reviewed and updated as appropriate: allergies, current medications, past family history, past medical history, past social history, past surgical history and problem list. Problem list updated.  Objective:  There were no  vitals filed for this visit. Due to virtual visit.  Fetal Status:     Movement: Present     General:  Alert, oriented and cooperative. Patient is in no acute distress.  Respiratory: Normal respiratory effort  Mental Status: Normal mood and affect. Normal behavior. Normal judgment and thought content.    PDMP reviewed during this encounter. No prescriptions  Last UDS: None available   Assessment and Plan:  Pregnancy: G4P0030 at [redacted]w[redacted]d  1. History of syphilis - Requested treatment info from [facility]. Will request complete records when pt has in-person appt.   2. Supervision of high risk pregnancy, antepartum (Primary) - Blood Pressure Monitoring (BLOOD PRESSURE KIT) DEVI; 1 Device by Does not apply route once for 1 dose.  Dispense: 1 each; Refill: 0 - CHL AMB BABYSCRIPTS SCHEDULE OPTIMIZATION - Prenatal Vit-Fe Fumarate-FA (MULTIVITAMIN-PRENATAL) 27-0.8 MG TABS tablet; Take 1 tablet by mouth daily at 12 noon.  Dispense: 30 tablet; Refill: 12  3. Substance abuse affecting pregnancy, antepartum (HCC)--Actively using Fentanyl. Desires home Subutex  induction. Had discussed Subutex  induction precess in detail with Dr. Lola at last visit. Instructions reviewed again. Written instructions in AVS. Applauded pt's plan to initiate MOUD Tx. Questions addressed. Encouraged pt to call friend if needed for support or to call 911 in case of emergencies. Discussed how and when to use Narcan  is aver needed. - buprenorphine  (SUBUTEX ) 8 MG SUBL SL tablet; Place 1 tablet (8 mg total) under the tongue in the morning, at noon, in the evening, and at bedtime for 15 days.  Dispense: 60 tablet; Refill: 0 - naloxone  (NARCAN ) nasal spray 4 mg/0.1 mL; Use as needed to reverse opioid overdose  Dispense: 2 each; Refill: 11  4. Opioid use disorder, severe, dependence (HCC) - buprenorphine  (SUBUTEX ) 8 MG SUBL SL tablet; Place 1 tablet (8 mg total) under the tongue in the morning, at noon, in the evening, and at bedtime  for 15 days.  Dispense: 60 tablet; Refill: 0 - naloxone  (NARCAN ) nasal spray 4 mg/0.1 mL; Use as needed to reverse opioid overdose  Dispense: 2 each; Refill: 11  5. [redacted] weeks gestation of pregnancy - Prenatal Vit-Fe Fumarate-FA (MULTIVITAMIN-PRENATAL) 27-0.8 MG TABS tablet; Take 1 tablet by mouth daily at 12 noon.  Dispense: 30 tablet; Refill: 12  6. Nausea and vomiting during pregnancy - ondansetron  (ZOFRAN -ODT) 4 MG disintegrating tablet; Take 1-2 tablets (4-8 mg total) by mouth every 8 (eight) hours as needed for nausea.  Dispense: 40 tablet; Refill: 2   Preterm labor symptoms and general obstetric precautions including but not limited to vaginal bleeding, contractions, leaking of fluid and fetal movement were reviewed in detail with the patient. Please refer to After Visit Summary for other counseling recommendations. The patient was advised to call back or seek an in-person evaluation if the symptoms worsen or if the condition fails to improve as anticipated.  Return in about 1 week (around 10/07/2023) for REACH ROB.   Future Appointments  Date Time Provider Department Center  10/10/2023  9:00 AM [facility] PROVIDER 1 [facility]  10/10/2023  9:30 AM [facility] US6 [facility]  10/14/2023 10:15 AM [facility]-BEHAVIORAL HEALTH CLINICIAN [facility]    Total face-to-face time with patient: 20 minutes.  Over 50% of encounter was spent on counseling and coordination of care.   Briyanna Billingham  Claudene HOWARD 09/30/2023 10:19 PM Center for [employer]

## 2023-10-07 ENCOUNTER — Telehealth: Payer: Self-pay | Admitting: Advanced Practice Midwife

## 2023-10-07 ENCOUNTER — Telehealth (INDEPENDENT_AMBULATORY_CARE_PROVIDER_SITE_OTHER): Payer: MEDICAID | Admitting: Advanced Practice Midwife

## 2023-10-07 DIAGNOSIS — O99322 Drug use complicating pregnancy, second trimester: Secondary | ICD-10-CM

## 2023-10-07 DIAGNOSIS — F1910 Other psychoactive substance abuse, uncomplicated: Secondary | ICD-10-CM

## 2023-10-07 DIAGNOSIS — O0990 Supervision of high risk pregnancy, unspecified, unspecified trimester: Secondary | ICD-10-CM

## 2023-10-07 DIAGNOSIS — Z3A21 21 weeks gestation of pregnancy: Secondary | ICD-10-CM

## 2023-10-07 DIAGNOSIS — O99320 Drug use complicating pregnancy, unspecified trimester: Secondary | ICD-10-CM

## 2023-10-07 DIAGNOSIS — Z8619 Personal history of other infectious and parasitic diseases: Secondary | ICD-10-CM | POA: Diagnosis not present

## 2023-10-07 DIAGNOSIS — F1120 Opioid dependence, uncomplicated: Secondary | ICD-10-CM

## 2023-10-07 DIAGNOSIS — Z59819 Housing instability, housed unspecified: Secondary | ICD-10-CM

## 2023-10-07 DIAGNOSIS — O0992 Supervision of high risk pregnancy, unspecified, second trimester: Secondary | ICD-10-CM

## 2023-10-07 NOTE — Progress Notes (Signed)
 Subjective:  Virtual Visit via Telephone Note  I connected with Michelle Stephens on 10/07/23 at  1:15 PM EDT by telephone and verified that I am speaking with the correct person using two identifiers.  Location: Patient: Hotel Provider: Office    I discussed the limitations, risks, security and privacy concerns of performing an evaluation and management service by telephone and the availability of in person appointments. I also discussed with the patient that there may be a patient responsible charge related to this service. The patient expressed understanding and agreed to proceed.  Michelle Stephens is a 23 y.o. G4P0030 at [redacted]w[redacted]d being seen today for ongoing prenatal care.  She is currently monitored for the following issues for this high-risk pregnancy and has MDD (major depressive disorder), recurrent episode, severe; PTSD (post-traumatic stress disorder); ADHD (attention deficit hyperactivity disorder), combined type; Self-inflicted injury; Supervision of high risk pregnancy, antepartum; Opioid use disorder, severe, dependence (HCC); Substance abuse affecting pregnancy, antepartum (HCC); History of syphilis; and Rubella non-immune status, antepartum on their problem list.  Patient reports no bleeding and no cramping.   . Vag. Bleeding: None.  Movement: Present. Denies leaking of fluid.   The following portions of the patient's history were reviewed and updated as appropriate: allergies, current medications, past family history, past medical history, past social history, past surgical history and problem list. Problem list updated.  Reports trying Subutex  induction 8/9 PM. Took two tablets and then a third tablet about 1 hour later. Fell asleep. Woke up and felt bad so she started using Fentanyl again. Last used 8/11 0300. Has been feeling fine since then. No withdrawal Sx or cravings. Unsure how to proceed with Subutex . Is motivated to continue trying Subutex  induction. Wants to know how  to proceed.    Objective:  There were no vitals filed for this visit.  Fetal Status:     Movement: Present     General:  Alert, oriented and cooperative. Patient is in no acute distress.  Skin: Skin is warm and dry. No rash noted.   Cardiovascular: Normal heart rate noted  Respiratory: Normal respiratory effort, no problems with respiration noted  Abdomen: Soft, gravid, appropriate for gestational age. Pain/Pressure: Absent     Pelvic: Vag. Bleeding: None     Cervical exam deferred        Extremities: Normal range of motion.     Mental Status: Normal mood and affect. Normal behavior. Normal judgment and thought content.   Urinalysis:      PDMP reviewed during this encounter.    Last UDS: None available   Assessment and Plan:  Pregnancy: G4P0030 at [redacted]w[redacted]d  1. Supervision of high risk pregnancy, antepartum (Primary)  2. Substance abuse affecting pregnancy, antepartum (HCC) - See below.  3. Opioid use disorder, severe, dependence (HCC) - Unsuccessful Subutex  induction but motivated to continue trying. Likely had not gone into withdrawal enough before starting Subutex . Not having withdrawal Sx currently. Last used Fentanyl 36 hours ago. Last Subutex  ~60 hours ago. Per consult with Dr. Lola, recommend trying to go 72 hours without using Fentanyl and than taking Subutex  BID or TID if needed for withdrawal Sx at lower dose.   4. History of syphilis - Needs labs at next in-person visit 5. [redacted] weeks gestation of pregnancy - offer AFP at next in-person visit.   6. Housing insecurity -  Living in a hotel. Has to pay daily and has difficulty affording rent. Rental assistance requested.   Preterm labor symptoms and  general obstetric precautions including but not limited to vaginal bleeding, contractions, leaking of fluid and fetal movement were reviewed in detail with the patient. Please refer to After Visit Summary for other counseling recommendations.   Return in about 1 week  (around 10/14/2023) for REACH ROB.   Future Appointments  Date Time Provider Department Center  10/10/2023  9:00 AM [facility] PROVIDER 1 [facility]  10/10/2023  9:30 AM [facility] US6 [facility]  10/14/2023 12:15 PM [facility]BEHAVIORAL HEALTH CLINICIAN [facility]  10/14/2023  1:15 PM Lola Donnice HERO, MD [facility]    Total face-to-face time with patient: 20 minutes.  Over 50% of encounter was spent on counseling and coordination of care.   Eria Lozoya  Claudene HOWARD 10/07/2023 3:30 PM Center for [employer] [facility], [facility]

## 2023-10-10 ENCOUNTER — Ambulatory Visit (HOSPITAL_BASED_OUTPATIENT_CLINIC_OR_DEPARTMENT_OTHER): Payer: MEDICAID | Admitting: Obstetrics and Gynecology

## 2023-10-10 ENCOUNTER — Other Ambulatory Visit: Payer: Self-pay | Admitting: *Deleted

## 2023-10-10 ENCOUNTER — Ambulatory Visit: Payer: MEDICAID | Attending: Obstetrics and Gynecology

## 2023-10-10 VITALS — BP 128/73 | HR 83

## 2023-10-10 DIAGNOSIS — O99320 Drug use complicating pregnancy, unspecified trimester: Secondary | ICD-10-CM

## 2023-10-10 DIAGNOSIS — Z362 Encounter for other antenatal screening follow-up: Secondary | ICD-10-CM

## 2023-10-10 DIAGNOSIS — Z3A20 20 weeks gestation of pregnancy: Secondary | ICD-10-CM

## 2023-10-10 DIAGNOSIS — F1910 Other psychoactive substance abuse, uncomplicated: Secondary | ICD-10-CM | POA: Diagnosis present

## 2023-10-10 DIAGNOSIS — O43192 Other malformation of placenta, second trimester: Secondary | ICD-10-CM | POA: Diagnosis not present

## 2023-10-10 DIAGNOSIS — O98112 Syphilis complicating pregnancy, second trimester: Secondary | ICD-10-CM | POA: Insufficient documentation

## 2023-10-10 DIAGNOSIS — E669 Obesity, unspecified: Secondary | ICD-10-CM

## 2023-10-10 DIAGNOSIS — F1120 Opioid dependence, uncomplicated: Secondary | ICD-10-CM

## 2023-10-10 DIAGNOSIS — Z3A21 21 weeks gestation of pregnancy: Secondary | ICD-10-CM | POA: Diagnosis present

## 2023-10-10 DIAGNOSIS — O99210 Obesity complicating pregnancy, unspecified trimester: Secondary | ICD-10-CM | POA: Diagnosis present

## 2023-10-10 DIAGNOSIS — O99212 Obesity complicating pregnancy, second trimester: Secondary | ICD-10-CM

## 2023-10-10 DIAGNOSIS — O0990 Supervision of high risk pregnancy, unspecified, unspecified trimester: Secondary | ICD-10-CM

## 2023-10-10 DIAGNOSIS — O99322 Drug use complicating pregnancy, second trimester: Secondary | ICD-10-CM | POA: Diagnosis not present

## 2023-10-10 DIAGNOSIS — O09899 Supervision of other high risk pregnancies, unspecified trimester: Secondary | ICD-10-CM

## 2023-10-10 DIAGNOSIS — F1190 Opioid use, unspecified, uncomplicated: Secondary | ICD-10-CM

## 2023-10-10 NOTE — Progress Notes (Signed)
 Maternal-Fetal Medicine Consultation Name: Michelle Stephens MRN: [id number]  G4 P0030 at 20w 3d gestation.  Patient is here for fetal anatomy scan. On cell-free fetal DNA screening, the risks of aneuploidies are not increased.  Her problems include: - Opioid use disorder (OUD).  Patient takes buprenorphine  sublingual tablets 8 mg 4 times daily. 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 - Pregravid BMI 39.4. Ultrasound We performed fetal anatomy scan.  Single umbilical artery is seen.  No other makers of aneuploidies or fetal structural defects are seen. Fetal anatomical survey could not be completed because of fetal position and maternal body habitus.  Fetal biometry is consistent with her previously-established dates. Amniotic fluid is normal and good fetal activity is seen. Patient understands the limitations of ultrasound in detecting fetal anomalies.  No evidence of placentomegaly or hydrops or polyhydramnios. As maternal obesity imposes limitations on the resolution of images, fetal anomalies may be missed.  Our concerns include: Pregnancy dating Patient is unsure of her LMP date.  She reports she had one early ultrasound in a "bus" and we do not have the reports available with us  now.  I recommended that we date her pregnancy by today's ultrasound. I have assigned her EDD at 02/24/2024.  Single umbilical artery (SUA) I explained the finding with the help of ultrasound images and diagrams.  SUA is seen in about 0.25% to 1% of fetuses. In the absence of other anomalies, the risk for aneuploidies is not increased. However, ultrasound has limitations in detecting fetal anomalies that may be missed on targeted anatomical survey.  SUA can be associated with fetal growth restriction and we recommend serial growth scans for fetal growth assessment. The association of SUA with cardiac anomalies is not conclusively proven.    I discussed the  significance and limitations of cell free fetal DNA screening in detecting aneuploidies.  I informed her only amniocentesis will give a definitive result on the fetal karyotype and some genetic conditions.  Explained amniocentesis procedure and possible complications including miscarriage (1 and 500 procedures).   Patient understands that ultrasound has limitations in detecting fetal anomalies.  She will discuss with her family and decide about amniocentesis. Syphilis in pregnancy   Congenital syphilis involves multiple organs. Ultrasound findings include hepatomegaly, placentomegaly, fetal anemia, polyhdramnios, and, in severe cases, hydrops fetalis. Perinatal transmission can occur in about 10% of latent cases of syphilis. I reassured the patient of normal today's ultrasound findings. I also informed her of the limitations of ultrasound in diagnosing congenital syphilis.  Efficacy of Benzathine Penicillin G approaches 99.7% in treatment of maternal infection and 98.2% for preventing congenital syphilis (all stages of syphilis).    Treponemal tests can remain positive for life. It is difficult to diagnose the reinfection. However, if four-fold increase in RPR is seen, one can assume reinfection. I recommend RPR screening in the third trimester.  I briefly discussed opioid use disorder.  Buprenorphine  can be safely given in pregnancy.  It is an opioid with mixed agonist-antagonist activity.  Recommendations - An appointment was made for her to return in 4 weeks for completion of fetal anatomy. - Fetal growth assessments every 4 to 6 weeks till delivery.   Consultation including face-to-face (more than 50%) counseling 45 minutes.

## 2023-10-13 NOTE — Telephone Encounter (Signed)
 See Virtual visit note

## 2023-10-14 ENCOUNTER — Other Ambulatory Visit (HOSPITAL_COMMUNITY)
Admission: RE | Admit: 2023-10-14 | Discharge: 2023-10-14 | Disposition: A | Discharge status: Home / Self Care | Payer: MEDICAID | Source: Ambulatory Visit | Attending: Family Medicine | Admitting: Family Medicine

## 2023-10-14 ENCOUNTER — Ambulatory Visit (INDEPENDENT_AMBULATORY_CARE_PROVIDER_SITE_OTHER): Payer: MEDICAID | Admitting: Family Medicine

## 2023-10-14 ENCOUNTER — Encounter: Payer: Self-pay | Admitting: Family Medicine

## 2023-10-14 ENCOUNTER — Ambulatory Visit (INDEPENDENT_AMBULATORY_CARE_PROVIDER_SITE_OTHER): Payer: MEDICAID | Admitting: Clinical

## 2023-10-14 ENCOUNTER — Other Ambulatory Visit: Payer: Self-pay

## 2023-10-14 VITALS — BP 113/83 | HR 80 | Wt 245.3 lb

## 2023-10-14 DIAGNOSIS — Z8619 Personal history of other infectious and parasitic diseases: Secondary | ICD-10-CM | POA: Diagnosis not present

## 2023-10-14 DIAGNOSIS — F902 Attention-deficit hyperactivity disorder, combined type: Secondary | ICD-10-CM

## 2023-10-14 DIAGNOSIS — F332 Major depressive disorder, recurrent severe without psychotic features: Secondary | ICD-10-CM | POA: Diagnosis not present

## 2023-10-14 DIAGNOSIS — O0990 Supervision of high risk pregnancy, unspecified, unspecified trimester: Secondary | ICD-10-CM | POA: Insufficient documentation

## 2023-10-14 DIAGNOSIS — Z59819 Housing instability, housed unspecified: Secondary | ICD-10-CM | POA: Diagnosis not present

## 2023-10-14 DIAGNOSIS — F1120 Opioid dependence, uncomplicated: Secondary | ICD-10-CM

## 2023-10-14 DIAGNOSIS — O0992 Supervision of high risk pregnancy, unspecified, second trimester: Secondary | ICD-10-CM

## 2023-10-14 DIAGNOSIS — Z2839 Other underimmunization status: Secondary | ICD-10-CM

## 2023-10-14 DIAGNOSIS — O09892 Supervision of other high risk pregnancies, second trimester: Secondary | ICD-10-CM

## 2023-10-14 DIAGNOSIS — Z3A22 22 weeks gestation of pregnancy: Secondary | ICD-10-CM

## 2023-10-14 DIAGNOSIS — O09899 Supervision of other high risk pregnancies, unspecified trimester: Secondary | ICD-10-CM

## 2023-10-14 DIAGNOSIS — F4310 Post-traumatic stress disorder, unspecified: Secondary | ICD-10-CM

## 2023-10-14 NOTE — Progress Notes (Signed)
 Subjective:  Michelle Stephens is a 23 y.o. G4P0030 at [redacted]w[redacted]d being seen today for ongoing prenatal care.  She is currently monitored for the following issues for this high-risk pregnancy and has MDD (major depressive disorder), recurrent episode, severe; PTSD (post-traumatic stress disorder); ADHD (attention deficit hyperactivity disorder), combined type; Self-inflicted injury; Supervision of high risk pregnancy, antepartum; Opioid use disorder, severe, dependence (HCC); Substance abuse affecting pregnancy, antepartum (HCC); History of syphilis; Rubella non-immune status, antepartum; and Housing insecurity on their problem list.  Patient reports no complaints.  Contractions: Not present. Vag. Bleeding: None.  Movement: Present. Denies leaking of fluid.   The following portions of the patient's history were reviewed and updated as appropriate: allergies, current medications, past family history, past medical history, past social history, past surgical history and problem list. Problem list updated.  Objective:   Vitals:   10/14/23 1356  BP: 113/83  Pulse: 80  Weight: 245 lb 4.8 oz (111.3 kg)    Fetal Status: Fetal Heart Rate (bpm): 159   Movement: Present     General:  Alert, oriented and cooperative. Patient is in no acute distress.  Skin: Skin is warm and dry. No rash noted.   Cardiovascular: Normal heart rate noted  Respiratory: Normal respiratory effort, no problems with respiration noted  Abdomen: Soft, gravid, appropriate for gestational age. Pain/Pressure: Absent     Pelvic: Vag. Bleeding: None     Cervical exam deferred        Extremities: Normal range of motion.  Edema: Trace  Mental Status: Normal mood and affect. Normal behavior. Normal judgment and thought content.   Urinalysis:      PDMP reviewed during this encounter.    Last UDS: No results found for: CREATIUR  None on file  Assessment and Plan:  Pregnancy: G4P0030 at [redacted]w[redacted]d  1. Supervision of high risk  pregnancy, antepartum (Primary) BP and FHR normal Due for AFP, collected today Due for pap, collected - Cytology - PAP( [place]) - ToxAssure Flex 15, Ur  2. 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 Once we coordinate that we can get her admitted and then transition to buprenorphine  Discussed I will let her know as soon as the request for funds is approved Also encouraged her to continue trying to get on buprenorphine  in the mean time, has been counseled extensively in the past about induction methods Consents verbally to UDS today  3. History of syphilis See PL for timeline of diagnosis, at present only have evidence of late latent and unfortunately only received 2 doses and not three On discussion with patient she is admanant that she received three doses We called and spoke with [facility] and confirmed she did in fact receive three doses of bicillin  4. Housing insecurity Still a constant issue Will request 1 month  rent through [employer], see above  5. Rubella non-immune status, antepartum Offer MMR PP  6. Severe episode of recurrent major depressive disorder, without psychotic features (HCC) Seeing Warren, referred to psychiatry as well for possible diagnosis of bipolar In future would like to be scheduled to run concurrent with REACH  visits  Preterm labor symptoms and general obstetric precautions including but not limited to vaginal bleeding, contractions, leaking of fluid and fetal movement were reviewed in detail with the patient. Please refer to After Visit Summary for other counseling recommendations.  Return in about 2 weeks (around 10/28/2023) for REACH clinic, ob visit.  Future Appointments  Date Time Provider Department Center  10/28/2023  2:45 PM [facility] HEALTH CLINICIAN [facility]  11/11/2023  3:15 PM Lola Donnice HERO, MD [facility]  11/13/2023 11:15 AM [facility] PROVIDER 1 [facility]  11/13/2023 11:30 AM [facility] US3 [facility]  11/25/2023  8:40 AM [facility] LAB [facility]     Lola Donnice HERO, MD

## 2023-10-14 NOTE — BH Specialist Note (Signed)
 Integrated Behavioral Health via Telemedicine Visit  10/14/2023 [name] [id number]  Number of Integrated Behavioral Health Clinician visits: 1- Initial Visit  Session Start time: 1216   Session End time: 1241  Total time in minutes: 25   Referring Provider: [place]  Claudene, CNM Patient/Family location: Home [facility] Provider location: Center for [facility]  All persons participating in visit: [name] and [facility] Michelle Stephens   Types of Service: Individual psychotherapy and Video visit  I connected with Michelle Stephens and/or Michelle Stephens's n/a via  Telephone or Engineer, civil (consulting)  (Video is Caregility application) and verified that I am speaking with the correct person using two identifiers. Discussed confidentiality: Yes   I discussed the limitations of telemedicine and the availability of in person appointments.  Discussed there is a possibility of technology failure and discussed alternative modes of communication if that failure occurs.  I discussed that engaging in this telemedicine visit, they consent to the provision of behavioral healthcare and the services will be billed under their insurance.  [name] expressed understanding and consented to Telemedicine visit: Yes   Presenting Concerns: Patient and/or family reports the following symptoms/concerns: Concern about family history of postpartum psychosis (pt's mother); pt says she was diagnosed with bipolar disorder at 23, did two-week trials of several medications that she stopped taking between [age]yo. Pt is experiencing 1-2 panic attacks daily, has had a difficult transition to subutex  so far; open to referral to psychiatry.  Duration of problem: Ongoing; Severity of problem: moderately severe  Patient and/or Family's Strengths/Protective Factors: Sense of purpose  Goals Addressed: Patient will:  Reduce  symptoms of: anxiety, depression, mood instability, and stress   Increase knowledge and/or ability of: stress reduction   Demonstrate ability to: Increase healthy adjustment to current life circumstances, Increase adequate support systems for patient/family, and Increase motivation to adhere to plan of care  Progress towards Goals: Ongoing    Interventions: Interventions utilized:  Solution-Focused Strategies, Psychoeducation and/or Health Education, Link to [employer], and Supportive Reflection Standardized Assessments completed: Not Needed    Patient and/or Family Response: Patient agrees with treatment plan.   Clinical Assessment/Diagnosis  Severe episode of recurrent major depressive disorder, without psychotic features (HCC)  PTSD (post-traumatic stress disorder)  ADHD (attention deficit hyperactivity disorder), combined type  Opioid use disorder, severe, dependence (HCC).   Patient may benefit from psychoeducation and brief therapeutic interventions regarding coping with symptoms of depression, anxiety, life stress .  Plan: Follow up with behavioral health clinician on : Two weeks Behavioral recommendations:  -Continue taking Subutex  and prenatal vitamin as prescribed; discuss with medical provider today -Accept referral to psychiatry; use walk-in [facility] hours to establish care or [facility] as needed (information on After Visit Summary) -Read through information on After Visit Summary; use as needed and discussed  Referral(s): Integrated Art gallery manager (In Clinic) and [employer] [facility] (LME/Outside Clinic)  I discussed the assessment and treatment plan with the patient and/or parent/guardian. They were provided an opportunity to ask questions and all were answered. They agreed with the plan and demonstrated an understanding of the instructions.   They were advised to call back or seek an in-person evaluation if the  symptoms worsen or if the condition fails to improve as anticipated.  Warren JAYSON Mering, LCSW     09/23/2023    4:46 PM  Depression screen PHQ 2/9  Decreased Interest 2  Down, Depressed, Hopeless 2  PHQ - 2 Score 4  Altered sleeping 3  Tired, decreased energy 1  Change in appetite 2  Feeling bad or failure about yourself  2  Trouble concentrating 1  Moving slowly or fidgety/restless 2  Suicidal thoughts 2  PHQ-9 Score 17      09/23/2023    4:46 PM  GAD 7 : Generalized Anxiety Score  Nervous, Anxious, on Edge 2  Control/stop worrying 2  Worry too much - different things 2  Trouble relaxing 2  Restless 2  Easily annoyed or irritable 2  Afraid - awful might happen 3  Total GAD 7 Score 15

## 2023-10-14 NOTE — Patient Instructions (Signed)

## 2023-10-14 NOTE — Progress Notes (Signed)
   Subjective:   Michelle Stephens is a 23 y.o. G4P0030 here today for ongoing substance exposed newborn consult.   Health Maintenance Due  Topic Date Due   HPV VACCINES (1 - 3-dose series) Never done   DTaP/Tdap/Td (1 - Tdap) Never done   Hepatitis B Vaccines 19-59 Average Risk (1 of 3 - 19+ 3-dose series) Never done   Cervical Cancer Screening (Pap smear)  Never done   COVID-19 Vaccine (1 - 2024-25 season) Never done   INFLUENZA VACCINE  09/26/2023    Past Medical History:  Diagnosis Date   ADHD (attention deficit hyperactivity disorder)    Allergy    seasonal   Anxiety    Asthma    when young   Depression    Eczema    GERD (gastroesophageal reflux disease)    Headache(784.0)    Heart murmur    when a baby   JRA (juvenile rheumatoid arthritis) (HCC)    Obesity    Urinary tract infection    Vision abnormalities     Past Surgical History:  Procedure Laterality Date   WISDOM TOOTH EXTRACTION  2019    The following portions of the patient's history were reviewed and updated as appropriate: allergies, current medications, past family history, past medical history, past social history, past surgical history and problem list.     Objective:   Michelle Stephens is well appearing in no acute distress. She has linear thinking and clear communication.      Assessment and Plan:  Met with Michelle Stephens today at [facility] for ongoing substance exposed newborn consult. We discussed her ongoing prenatal care including her recent attempt of Subutex  withdrawal and ongoing MOUD management. Michelle Stephens shared that she is still requiring substance use for breakthrough withdrawal symptoms. Encouraged Michelle Stephens to discuss these symptoms with Dr. Lola and Michelle Stephens, CNM. We continued to discuss expectations follow delivery and sign of NOWS.    Encouraged Michelle Stephens to contact [facility] consult phone in between appointments with any further questions or concerns.     Problem List Items Addressed This Visit        Other   MDD (major depressive disorder), recurrent episode, severe   Supervision of high risk pregnancy, antepartum - Primary   Relevant Orders   Cytology - PAP( [place])   ToxAssure Flex 15, Ur   Opioid use disorder, severe, dependence (HCC)   History of syphilis   Rubella non-immune status, antepartum   Housing insecurity    Routine preventative health maintenance measures emphasized. Please refer to After Visit Summary for other counseling recommendations.   Return in about 2 weeks (around 10/28/2023) for RE[facility] clinic, ob visit.    Total face-to-face time with patient: 20 minutes.  Over 50% of encounter was spent on counseling and coordination of care.   Michelle Stephens, NNP-BC Neonatal Nurse Practitioner Substance Exposed Newborn Consult at the [facility] [phone]

## 2023-10-14 NOTE — Patient Instructions (Addendum)
 Center for [facility] at [facility] [address] [place], [place] [address] [phone] (main office) [phone] [facility] office)  New Parent Support Groups www.[email or web address].net [email or web address]  [facility]  [address], [place], [place] [address] [phone] or [phone] [facility] 24/7 FOR ANYONE [address], [place], [place]  [phone] Fax: [phone] [email or web address] *Interpreters available *Accepts all insurance and uninsured for Urgent Care needs *Accepts Medicaid and uninsured for outpatient treatment (below)    ONLY FOR [facility]  Below:   Outpatient New Patient Assessment/Therapy Walk-ins:        Monday -Thursday 8am until slots are full.        Every Friday 1pm-4pm  (first come, first served)                   New Patient Psychiatry/Medication Management        Monday-Friday 8am-11am (first come, first served)              For all walk-ins we ask that you arrive by 7:15am, because patients will be seen in the order of arrival.       BRAINSTORMING  Develop a Plan Goals: Provide a way to start conversation about your new life with a baby Assist parents in recognizing and using resources within their reach Help pave the way before birth for an easier period of transition afterwards.  Make a list of the following information to keep in a central location: Full name of Mom and Partner: _____________________________________________ Baby's full name and Date of Birth: ___________________________________________ Home Address: ___________________________________________________________ ________________________________________________________________________ Home Phone: ____________________________________________________________ Parents' cell numbers:  _____________________________________________________ ________________________________________________________________________ Name and contact info for OB: ______________________________________________ Name and contact info for Pediatrician:________________________________________ Contact info for Lactation Consultants: ________________________________________  REST and SLEEP *You each need at least 4-5 hours of uninterrupted sleep every day. Write specific names and contact information.* How are you going to rest in the postpartum period? While partner's home? When partner returns to work? When you both return to work? Where will your baby sleep? Who is available to help during the day? Evening? Night? Who could move in for a period to help support you? What are some ideas to help you get enough sleep? __________________________________________________________________________________________________________________________________________________________________________________________________________________________________________ NUTRITIOUS FOOD AND DRINK *Plan for meals before your baby is born so you can have healthy food to eat during the immediate postpartum period.* Who will look after breakfast? Lunch? Dinner? List names and contact information. Brainstorm quick, healthy ideas for each meal. What can you do before baby is born to prepare meals for the postpartum period? How can others help you with meals? Which grocery stores provide online shopping and delivery? Which restaurants offer take-out or delivery  options? ______________________________________________________________________________________________________________________________________________________________________________________________________________________________________________________________________________________________________________________________________________________________________________________________________  CARE FOR MOM *It's important that mom is cared for and pampered in the postpartum period. Remember, the most important ways new mothers need care are: sleep, nutrition, gentle exercise, and time off.* Who can come take care of mom during this period? Make a list of people with their contact information. List some activities that make you feel cared for, rested, and energized? Who can make sure you have opportunities to do these things? Does mom have a space of her very own within your home that's just for her? Make a "[facility]" where she can be comfortable, rest, and renew herself daily. ______________________________________________________________________________________________________________________________________________________________________________________________________________________________________________________________________________________________________________________________________________________________________________________________________    CARE FOR AND FEEDING BABY *Knowledgeable and encouraging people will offer the best support with regard to feeding your baby.* Educate  yourself and choose the best feeding option for your baby. Make a list of people who will guide, support, and be a resource for you as your care for and feed your baby. (Friends that have breastfed or are currently breastfeeding, lactation consultants, breastfeeding support groups, etc.) Consider a postpartum doula. (These websites can give you information: dona.org & https://shea.org/) Seek out local  breastfeeding resources like the breastfeeding support group at [employer] or [employer]. ______________________________________________________________________________________________________________________________________________________________________________________________________________________________________________________________________________________________________________________________________________________________________________________________________  GAITHER AND ERRANDS Who can help with a thorough cleaning before baby is born? Make a list of people who will help with housekeeping and chores, like laundry, light cleaning, dishes, bathrooms, etc. Who can run some errands for you? What can you do to make sure you are stocked with basic supplies before baby is born? Who is going to do the shopping? ______________________________________________________________________________________________________________________________________________________________________________________________________________________________________________________________________________________________________________________________________________________________________________________________________     Family Adjustment *Nurture yourselves.it helps parents be more loving and allows for better bonding with their child.* What sorts of things do you and partner enjoy doing together? Which activities help you to connect and strengthen your relationship? Make a list of those things. Make a list of people whom you trust to care for your baby so you can have some time together as a couple. What types of things help partner feel connected to Mom? Make a list. What needs will partner have in order to bond with baby? Other children? Who will care for them when you go into labor and while you are in the hospital? Think about what the needs of your older children might be. Who can help you meet those  needs? In what ways are you helping them prepare for bringing baby home? List some specific strategies you have for family adjustment. _______________________________________________________________________________________________________________________________________________________________________________________________________________________________________________________________________________________________________________________________________________  SUPPORT *Someone who can empathize with experiences normalizes your problems and makes them more bearable.* Make a list of other friends, neighbors, and/or co-workers you know with infants (and small children, if applicable) with whom you can connect. Make a list of local or online support groups, mom groups, etc. in which you can be involved. ______________________________________________________________________________________________________________________________________________________________________________________________________________________________________________________________________________________________________________________________________________________________________________________________________  Childcare Plans Investigate and plan for childcare if mom is returning to work. Talk about mom's concerns about her transition back to work. Talk about partner's concerns regarding this transition.  Mental Health *Your mental health is one of the highest priorities for a pregnant or postpartum mom.* 1 in 5 women experience anxiety and/or depression from the time of conception through the first year after birth. Postpartum Mood Disorders are the #1 complication of pregnancy and childbirth and the suffering experienced by these mothers is not necessary! These illnesses are temporary and respond well to treatment, which often includes self-care, social support, talk therapy, and medication when needed. Women  experiencing anxiety and depression often say things like: "I'm supposed to be happy.why do I feel so sad?", "Why can't I snap out of it?", "I'm having thoughts that scare me." There is no need to be embarrassed if you are feeling these symptoms: Overwhelmed, anxious, angry, sad, guilty, irritable, hopeless, exhausted but can't sleep You are NOT alone. You are NOT to blame. With help, you WILL be well. Where can I find help? Medical professionals such as your OB, midwife, gynecologist, family practitioner, primary care provider, pediatrician, or mental health providers; [facility] support groups: Feelings After Birth, Breastfeeding Support Group, Baby and Me Group, and Fit 4 Two exercise classes. You have permission to ask for help. It will confirm your feelings, validate your experiences, share/learn coping strategies,  and gain support and encouragement as you heal. You are important! BRAINSTORM Make a list of local resources, including resources for mom and for partner. Identify support groups. Identify people to call late at night - include names and contact info. Talk with partner about perinatal mood and anxiety disorders. Talk with your OB, midwife, and doula about baby blues and about perinatal mood and anxiety disorders. Talk with your pediatrician about perinatal mood and anxiety disorders.   Support & Sanity Savers   What do you really need?  Basics In preparing for a new baby, many expectant parents spend hours shopping for baby clothes, decorating the nursery, and deciding which car seat to buy. Yet most don't think much about what the reality of parenting a newborn will be like, and what they need to make it through that. So, here is the advice of experienced parents. We know you'll read this, and think "they're exaggerating, I don't really need that." Just trust us  on these, OK? Plan for all of this, and if it turns out you don't need it, come back and teach us  how you did  it!  Must-Haves (Once baby's survival needs are met, make sure you attend to your own survival needs!) Sleep An average newborn sleeps 16-18 hours per day, over 6-7 sleep periods, rarely more than three hours at a time. It is normal and healthy for a newborn to wake throughout the night... but really hard on parents!! Naps. Prioritize sleep above any responsibilities like: cleaning house, visiting friends, running errands, etc.  Sleep whenever baby sleeps. If you can't nap, at least have restful times when baby eats. The more rest you get, the more patient you will be, the more emotionally stable, and better at solving problems.  Food You may not have realized it would be difficult to eat when you have a newborn. Yet, when we talk to countless new parents, they say things like "it may be 2:00 pm when I realize I haven't had breakfast yet." Or "every time we sit down to dinner, baby needs to eat, and my food gets cold, so I don't bother to eat it." Finger food. Before your baby is born, stock up with one months' worth of food that: 1) you can eat with one hand while holding a baby, 2) doesn't need to be prepped, 3) is good hot or cold, 4) doesn't spoil when left out for a few hours, and 5) you like to eat. Think about: nuts, dried fruit, Clif bars, pretzels, jerky, gogurt, baby carrots, apples, bananas, crackers, cheez-n-crackers, string cheese, hot pockets or frozen burritos to microwave, garden burgers and breakfast pastries to put in the toaster, yogurt drinks, etc. Restaurant Menus. Make lists of your favorite restaurants & menu items. When family/friends want to help, you can give specific information without much thought. They can either bring you the food or send gift cards for just the right meals. Freezer Meals.  Take some time to make a few meals to put in the freezer ahead of time.  Easy to freeze meals can be anything such as soup, lasagna, chicken pie, or spaghetti sauce. Set up a Meal  Schedule.  Ask friends and family to sign up to bring you meals during the first few weeks of being home. (It can be passed around at baby showers!) You have no idea how helpful this will be until you are in the throes of parenting.  Machine[employer].it is a great website to check out. Emotional Support Know  who to call when you're stressed out. Parenting a newborn is very challenging work. There are times when it totally overwhelms your normal coping abilities. EVERY NEW PARENT NEEDS TO HAVE A PLAN FOR WHO TO CALL WHEN THEY JUST CAN'T COPE ANY MORE. (And it has to be someone other than the baby's other parent!) Before your baby is born, come up with at least one person you can call for support - write their phone number down and post it on the refrigerator. Anxiety & Sadness. Baby blues are normal after pregnancy; however, there are more severe types of anxiety & sadness which can occur and should not be ignored.  They are always treatable, but you have to take the first step by reaching out for help. [facility] offers a "Mom Talk" group which meets every Tuesday from 10 am - 11 am.  This group is for new moms who need support and connection after their babies are born.  Call [phone].  Really, Really Helpful (Plan for them! Make sure these happen often!!) Physical Support with Taking Care of Yourselves Asking friends and family. Before your baby is born, set up a schedule of people who can come and visit and help out (or ask a friend to schedule for you). Any time someone says "let me know what I can do to help," sign them up for a day. When they get there, their job is not to take care of the baby (that's your job and your joy). Their job is to take care of you!  Postpartum doulas. If you don't have anyone you can call on for support, look into postpartum doulas:  professionals at helping parents with caring for baby, caring for themselves, getting breastfeeding started, and helping with  household tasks. www.padanc.org is a helpful website for learning about doulas in our area. Peer Support / Parent Groups Why: One of the greatest ideas for new parents is to be around other new parents. Parent groups give you a chance to share and listen to others who are going through the same season of life, get a sense of what is normal infant development by watching several babies learn and grow, share your stories of triumph and struggles with empathetic ears, and forgive your own mistakes when you realize all parents are learning by trial and error. Where to find: There are many places you can meet other new parents throughout our community.  [facility] offers the following classes for new moms and their little ones:  Baby and Me (Birth to Crawling) and Breastfeeding Support Group. Go to www.conehealthybaby.com or call [phone] for more information. Time for your Relationship It's easy to get so caught up in meeting baby's immediate needs that it's hard to find time to connect with your partner, and meet the needs of your relationship. It's also easy to forget what "quality time with your partner" actually looks like. If you take your baby on a date, you'd be amazed how much of your couple time is spent feeding the baby, diapering the baby, admiring the baby, and talking about the baby. Dating: Try to take time for just the two of you. Babysitter tip: Sometimes when moms are breastfeeding a newborn, they find it hard to figure out how to schedule outings around baby's unpredictable feeding schedules. Have the babysitter come for a three hour period. When she comes over, if baby has just eaten, you can leave right away, and come back in two hours. If baby hasn't fed recently,  you start the date at home. Once baby gets hungry and gets a good feeding in, you can head out for the rest of your date time. Date Nights at Home: If you can't get out, at least set aside one evening a week to prioritize  your relationship: whenever baby dozes off or doesn't have any immediate needs, spend a little time focusing on each other. Potential conflicts: The main relationship conflicts that come up for new parents are: issues related to sexuality, financial stresses, a feeling of an unfair division of household tasks, and conflicts in parenting styles. The more you can work on these issues before baby arrives, the better!  Fun and Frills (Don't forget these. and don't feel guilty for indulging in them!) Everyone has something in life that is a fun little treat that they do just for themselves. It may be: reading the morning paper, or going for a daily jog, or having coffee with a friend once a week, or going to a movie on Friday nights, or fine chocolates, or bubble baths, or curling up with a good book. Unless you do fun things for yourself every now and then, it's hard to have the energy for fun with your baby. Whatever your "special" treats are, make sure you find a way to continue to indulge in them after your baby is born. These special moments can recharge you, and allow you to return to baby with a new joy   PERINATAL MOOD DISORDERS: MATERNAL MENTAL HEALTH FROM CONCEPTION THROUGH THE POSTPARTUM PERIOD   _________________________________________Emergency and Crisis Resources If you are an imminent risk to self or others, are experiencing intense personal distress, and/or have noticed significant changes in activities of daily living, call:  911 [facility]: [phone]  [address] [place], [place], [address] Mobile Crisis: [phone] National Suicide Hotline: 66 Or visit the following crisis centers: Local Emergency Departments [facility]:  [address] [place]  Mon-Friday 8am-3pm, [phone]                                                                                  ___________ Non-Crisis Resources To identify specific providers that are covered by your  insurance, contact your insurance company or local agencies:   Postpartum Support International- Warm-line: 346-808-8641                                                      __Outpatient Therapy and Medication Management   Providers:  [facility]: [phone] Hours: 9AM-5PM  Insurance Accepted: BRYAN Leyden, BCBS, Sherleen Hough, [place], Medicare  [facility]): [phone] Hours: 8AM-5:30PM  nsurance Accepted: All insurances EXCEPT AARP, [place], [place], and [employer] of the [place]: [phone] Hours: 8AM-8PM Insurance Accepted: Ley[place] OLIPHANT, Sherleen Hough, [place], Medicare, Delano Gasper [place] [phone] [facility]: [phone] Hours: 8:30AM-7PM Insurance Accepted: Leyden OLIPHANT, Medicaid, Medicare, Tricare, [employer] [facility]:  [phone]   Hours:9AM-5PM Insurance Accepted:  Leyden OLIPHANT, [place]  [address] [place], [facility]: [phone] Hours: 9AM-5:30PM Insurance Accepted: BRYAN Leyden, CHARON Pastures, and [place], Medicare, [facility]:  [phone] Hours: 9am-5pm Insurance Accepted: BCBS; they do not accept Medicaid/Medicare [facility]: [phone] Hours: 9am-9pm Insurance Accepted: All major insurance including Medicaid and Medicare [facility]: [phone] Hours: 9AM- 5PM Insurance Accepted: All insurances EXCEPT Medicaid and Medicare. [facility]: [phone]   ____________                                                                     Parenting Support Groups [facility] :  [address], [place], [place], [address] [phone] [facility]:  [phone] Family Support Network: (support for children in the NICU and/or with special needs),  [phone]     _____________                                                                                  Online Resources Postpartum Support International: SeekAlumni.co.za  800-944-4PPD Supporting Moms:  www.momssupportingmoms.net

## 2023-10-16 ENCOUNTER — Ambulatory Visit: Payer: Self-pay | Admitting: Family Medicine

## 2023-10-16 DIAGNOSIS — O0990 Supervision of high risk pregnancy, unspecified, unspecified trimester: Secondary | ICD-10-CM

## 2023-10-16 LAB — AFP, SERUM, OPEN SPINA BIFIDA
AFP MoM: 0.55
AFP Value: 32.8 ng/mL
Gest. Age on Collection Date: 22.6 wk
Maternal Age At EDD: 24.6 a
OSBR Risk 1 IN: 10000
Test Results:: NEGATIVE
Weight: 245 [lb_av]

## 2023-10-16 LAB — CYTOLOGY - PAP: Diagnosis: NEGATIVE

## 2023-10-17 LAB — TOXASSURE FLEX 15, UR
6-ACETYLMORPHINE IA: NEGATIVE ng/mL
7-aminoclonazepam: NOT DETECTED ng/mg{creat}
AMPHETAMINES IA: NEGATIVE ng/mL
Alpha-hydroxyalprazolam: NOT DETECTED ng/mg{creat}
Alpha-hydroxymidazolam: NOT DETECTED ng/mg{creat}
Alpha-hydroxytriazolam: NOT DETECTED ng/mg{creat}
Alprazolam: NOT DETECTED ng/mg{creat}
BARBITURATES IA: NEGATIVE ng/mL
BUPRENORPHINE: NEGATIVE
Benzodiazepines: NEGATIVE
Buprenorphine: NOT DETECTED ng/mg{creat}
Clonazepam: NOT DETECTED ng/mg{creat}
Creatinine: 145 mg/dL (ref >=20)
Desalkylflurazepam: NOT DETECTED ng/mg{creat}
Desmethyldiazepam: NOT DETECTED ng/mg{creat}
Desmethylflunitrazepam: NOT DETECTED ng/mg{creat}
Diazepam: NOT DETECTED ng/mg{creat}
ETHYL ALCOHOL Enzymatic: NEGATIVE g/dL
FENTANYL: POSITIVE
Fentanyl: 324 ng/mg{creat}
Flunitrazepam: NOT DETECTED ng/mg{creat}
Lorazepam: NOT DETECTED ng/mg{creat}
METHADONE IA: NEGATIVE ng/mL
METHADONE MTB IA: NEGATIVE ng/mL
Midazolam: NOT DETECTED ng/mg{creat}
Norbuprenorphine: NOT DETECTED ng/mg{creat}
Norfentanyl: >690 ng/mg{creat}
OPIATE CLASS IA: NEGATIVE ng/mL
OXYCODONE CLASS IA: NEGATIVE ng/mL
Oxazepam: NOT DETECTED ng/mg{creat}
PHENCYCLIDINE IA: NEGATIVE ng/mL
TAPENTADOL, IA: NEGATIVE ng/mL
TRAMADOL IA: NEGATIVE ng/mL
Temazepam: NOT DETECTED ng/mg{creat}

## 2023-10-17 LAB — CANNABINOIDS, MS, UR RFX
Cannabinoids Confirmation: POSITIVE
Carboxy-THC: 23 ng/mg{creat}

## 2023-10-17 LAB — COCAINE AND MTB, MS, UR RFX
Benzoylecgonine: 636 ng/mg{creat}
Cocaethylene: NOT DETECTED ng/mg{creat}
Cocaine Confirmation: POSITIVE
Cocaine: NOT DETECTED ng/mg{creat}

## 2023-10-24 NOTE — BH Specialist Note (Signed)
 Integrated Behavioral Health via Telemedicine Visit  10/28/2023 Michelle Stephens [id number]  Number of Integrated Behavioral Health Clinician visits: 2- Second Visit  Session Start time: 1455   Session End time: 1501  Total time in minutes: 6   Referring Provider: Donnice Carolus, MD Patient/Family location: Home [facility] Provider location: [facility]  All persons participating in visit: [name] and [facility] Michelle Stephens   Types of Service: Individual psychotherapy and Telephone visit  I connected with Michelle Stephens and/or Michelle Stephens's n/a via  Telephone or Video Enabled Telemedicine Application  (Video is Caregility application) and verified that I am speaking with the correct person using two identifiers. Discussed confidentiality: Yes   I discussed the limitations of telemedicine and the availability of in person appointments.  Discussed there is a possibility of technology failure and discussed alternative modes of communication if that failure occurs.  I discussed that engaging in this telemedicine visit, they consent to the provision of behavioral healthcare and the services will be billed under their insurance.  [name] expressed understanding and consented to Telemedicine visit: Yes   Presenting Concerns: Patient and/or family reports the following symptoms/concerns: Current exhaustion, attributes to not having a room available until this afternoon, sleeping sporadically, as able. Pt was unable to use walk-in hours last week to establish with psychiatry, due to friend's schedule(friend as transportation).  Duration of problem: Ongoing; Severity of problem: severe  Progress towards Goals: Ongoing    Interventions: Interventions utilized:  Motivational Interviewing and Supportive Reflection Standardized Assessments completed: Not Needed  Patient and/or Family Response:  Patient agrees with treatment plan.   Clinical Assessment/Diagnosis  Severe episode of recurrent major depressive disorder, without psychotic features (HCC)  PTSD (post-traumatic stress disorder)  ADHD (attention deficit hyperactivity disorder), combined type  Opioid use disorder, severe, dependence (HCC) .   Patient may benefit from continued therapeutic intervention, as well as establishing care with psychiatry.  Plan: Follow up with behavioral health clinician on : Two weeks Behavioral recommendations:  -Take much-needed nap immediately after this brief visit today -Continue prioritizing healthy self-care regarding sleeping and eating, as much as able -Continue plan to have friend drop off at [facility] at the earlier recommended time, as soon as able, to establish care with psychiatry -Continue plan to attend [facility] clinic in two weeks; follow medical provider recommendations -Accept referral to [facility] Referral(s): Integrated Art gallery manager (In Clinic) and Community Resources:  REACH  I discussed the assessment and treatment plan with the patient and/or parent/guardian. They were provided an opportunity to ask questions and all were answered. They agreed with the plan and demonstrated an understanding of the instructions.   They were advised to call back or seek an in-person evaluation if the symptoms worsen or if the condition fails to improve as anticipated.  Julee Stoll C Boluwatife Mutchler, LCSW

## 2023-10-28 ENCOUNTER — Ambulatory Visit: Payer: MEDICAID | Admitting: Clinical

## 2023-10-28 DIAGNOSIS — F1120 Opioid dependence, uncomplicated: Secondary | ICD-10-CM

## 2023-10-28 DIAGNOSIS — F332 Major depressive disorder, recurrent severe without psychotic features: Secondary | ICD-10-CM

## 2023-10-28 DIAGNOSIS — F902 Attention-deficit hyperactivity disorder, combined type: Secondary | ICD-10-CM

## 2023-10-28 DIAGNOSIS — F4310 Post-traumatic stress disorder, unspecified: Secondary | ICD-10-CM

## 2023-11-07 NOTE — BH Specialist Note (Signed)
 Integrated Behavioral Health Follow Up In-Person Visit  MRN: [id number] Name: Michelle Stephens  Number of Integrated Behavioral Health Clinician visits: 3- Third Visit  Session Start time: 1500   Session End time: 1520  Total time in minutes: 20    Types of Service: Individual psychotherapy  Interpretor:No. Interpretor Name and Language: n/a  Subjective: Michelle Stephens is a 23 y.o. female accompanied by n/a Patient was referred by [place]  Claudene, CNM for positive screening. Patient reports the following symptoms/concerns: Housing instability as primary concern, followed by concern regarding cat being cared for while she is hospitalized and managing pain in childbirth; pt's first goal is to obtain stable housing, as this will be the first step towards stability in other areas of life; warrant may be a barrier to finding work and housing.  Duration of problem: Current pregnancy; Severity of problem: severe  Objective: Mood: Anxious and Affect: Appropriate Risk of harm to self or others: No plan to harm self or others  Life Context: Family and Social: Pt temporary stay in hotel; siblings supportive as able School/Work: Job seeking Self-Care: Attending [facility] clinic as self-care Life Changes: Current pregnancy  Patient and/or Family's Strengths/Protective Factors: Social connections and Sense of purpose  Goals Addressed: Patient will:  Reduce symptoms of: anxiety, depression, and stress   Increase knowledge and/or ability of: healthy habits and stress reduction   Demonstrate ability to: Increase healthy adjustment to current life circumstances, Increase adequate support systems for patient/family, and Increase motivation to adhere to plan of care  Progress towards Goals: Ongoing  Interventions: Interventions utilized:  Motivational Interviewing, Link to [employer], and Supportive Reflection Standardized Assessments completed: Not Needed  Patient and/or  Family Response: Patient agrees with treatment plan.   Patient Centered Plan: Patient is on the following Treatment Plan(s): IBH  Clinical Assessment/Diagnosis  Severe episode of recurrent major depressive disorder, without psychotic features (HCC)  PTSD (post-traumatic stress disorder)  Opioid use disorder, severe, dependence (HCC)  ADHD (attention deficit hyperactivity disorder), combined type  Psychosocial stressors .   Patient may benefit from psychoeducation and brief therapeutic interventions regarding coping with symptoms of depression, anxiety, life stress .  Plan: Follow up with behavioral health clinician on : Three weeks Behavioral recommendations:  -Continue ongoing discussion with medical providers regarding birth plan/ pain management in childbirth, as well as BH medication to manage substance use disorder -Continue working with Licensed Clinical Addiction Specialist(LCAS) regarding housing and pet care options -Read through information on After Visit Summary; use as needed and discussed -Begin prioritizing healthy self-care (regular meals, adequate rest; allowing practical help from supportive friends and family)  -Continue to consider establishing care with psychiatry via walk-in option prior to third trimester, at [facility], as discussed  Referral(s): Integrated Art gallery manager (In Clinic), Community Mental Health Services (LME/Outside Clinic), and Community Resources:  Legal  Warren JAYSON Mering, [place]     09/23/2023    4:46 PM  Depression screen PHQ 2/9  Decreased Interest 2  Down, Depressed, Hopeless 2  PHQ - 2 Score 4  Altered sleeping 3  Tired, decreased energy 1  Change in appetite 2  Feeling bad or failure about yourself  2  Trouble concentrating 1  Moving slowly or fidgety/restless 2  Suicidal thoughts 2  PHQ-9 Score 17      09/23/2023    4:46 PM  GAD 7 : Generalized Anxiety Score  Nervous, Anxious, on Edge 2  Control/stop worrying 2   Worry too much - different things 2  Trouble relaxing 2  Restless 2  Easily annoyed or irritable 2  Afraid - awful might happen 3  Total GAD 7 Score 15

## 2023-11-11 ENCOUNTER — Ambulatory Visit: Payer: MEDICAID | Admitting: Family Medicine

## 2023-11-11 ENCOUNTER — Other Ambulatory Visit: Payer: Self-pay

## 2023-11-11 ENCOUNTER — Encounter: Payer: Self-pay | Admitting: Family Medicine

## 2023-11-11 ENCOUNTER — Ambulatory Visit (INDEPENDENT_AMBULATORY_CARE_PROVIDER_SITE_OTHER): Payer: MEDICAID | Admitting: Clinical

## 2023-11-11 VITALS — BP 111/82 | HR 89 | Wt 260.9 lb

## 2023-11-11 DIAGNOSIS — O0992 Supervision of high risk pregnancy, unspecified, second trimester: Secondary | ICD-10-CM

## 2023-11-11 DIAGNOSIS — Z59819 Housing instability, housed unspecified: Secondary | ICD-10-CM | POA: Diagnosis not present

## 2023-11-11 DIAGNOSIS — F1120 Opioid dependence, uncomplicated: Secondary | ICD-10-CM | POA: Diagnosis not present

## 2023-11-11 DIAGNOSIS — Z658 Other specified problems related to psychosocial circumstances: Secondary | ICD-10-CM

## 2023-11-11 DIAGNOSIS — F332 Major depressive disorder, recurrent severe without psychotic features: Secondary | ICD-10-CM | POA: Diagnosis not present

## 2023-11-11 DIAGNOSIS — F4310 Post-traumatic stress disorder, unspecified: Secondary | ICD-10-CM

## 2023-11-11 DIAGNOSIS — F902 Attention-deficit hyperactivity disorder, combined type: Secondary | ICD-10-CM

## 2023-11-11 DIAGNOSIS — Z2839 Other underimmunization status: Secondary | ICD-10-CM

## 2023-11-11 DIAGNOSIS — Z8619 Personal history of other infectious and parasitic diseases: Secondary | ICD-10-CM | POA: Diagnosis not present

## 2023-11-11 DIAGNOSIS — O0990 Supervision of high risk pregnancy, unspecified, unspecified trimester: Secondary | ICD-10-CM

## 2023-11-11 DIAGNOSIS — O09892 Supervision of other high risk pregnancies, second trimester: Secondary | ICD-10-CM

## 2023-11-11 DIAGNOSIS — Z3A26 26 weeks gestation of pregnancy: Secondary | ICD-10-CM

## 2023-11-11 NOTE — Patient Instructions (Addendum)
 Center for [facility] at [facility] [address] [place], [place] [address] [phone] (main office) [phone] [facility] office)  [employer] [place]  PlatinumVoice.no [phone]  [facility] [email or web address]  New Parent Support Groups www.[email or web address].net [email or web address]  [facility]  [address] [place], [place] [address] [phone] or [phone] [facility] 24/7 FOR ANYONE [address], [place], [place]  [phone] Fax: [phone] [email or web address] *Interpreters available *Accepts all insurance and uninsured for Urgent Care needs *Accepts Medicaid and uninsured for outpatient treatment (below)    ONLY FOR [facility]  Below:   Outpatient New Patient Assessment/Therapy Walk-ins:        Monday -Thursday 8am until slots are full.        Every Friday 1pm-4pm  (first come, first served)                   New Patient Psychiatry/Medication Management        Monday-Friday 8am-11am (first come, first served)              For all walk-ins we ask that you arrive by 7:15am, because patients will be seen in the order of arrival.       BRAINSTORMING  Develop a Plan Goals: Provide a way to start conversation about your new life with a baby Assist parents in recognizing and using resources within their reach Help pave the way before birth for an easier period of transition afterwards.  Make a list of the following information to keep in a central location: Full name of Mom and Partner: _____________________________________________ Baby's full name and Date of Birth: ___________________________________________ Home Address: ___________________________________________________________ ________________________________________________________________________ Home Phone: ____________________________________________________________ Parents' cell  numbers: _____________________________________________________ ________________________________________________________________________ Name and contact info for OB: ______________________________________________ Name and contact info for Pediatrician:________________________________________ Contact info for Lactation Consultants: ________________________________________  REST and SLEEP *You each need at least 4-5 hours of uninterrupted sleep every day. Write specific names and contact information.* How are you going to rest in the postpartum period? While partner's home? When partner returns to work? When you both return to work? Where will your baby sleep? Who is available to help during the day? Evening? Night? Who could move in for a period to help support you? What are some ideas to help you get enough sleep? __________________________________________________________________________________________________________________________________________________________________________________________________________________________________________ NUTRITIOUS FOOD AND DRINK *Plan for meals before your baby is born so you can have healthy food to eat during the immediate postpartum period.* Who will look after breakfast? Lunch? Dinner? List names and contact information. Brainstorm quick, healthy ideas for each meal. What can you do before baby is born to prepare meals for the postpartum period? How can others help you with meals? Which grocery stores provide online shopping and delivery? Which restaurants offer take-out or delivery  options? ______________________________________________________________________________________________________________________________________________________________________________________________________________________________________________________________________________________________________________________________________________________________________________________________________  CARE FOR MOM *It's important that mom is cared for and pampered in the postpartum period. Remember, the most important ways new mothers need care are: sleep, nutrition, gentle exercise, and time off.* Who can come take care of mom during this period? Make a list of people with their contact information. List some activities that make you feel cared for, rested, and energized? Who can make sure you have opportunities to do these things? Does mom have a space of her very own within your home that's just for her? Make a "[facility]" where she can be comfortable, rest, and renew herself daily. ______________________________________________________________________________________________________________________________________________________________________________________________________________________________________________________________________________________________________________________________________________________________________________________________________    CARE FOR AND FEEDING BABY *Knowledgeable and encouraging  people will offer the best support with regard to feeding your baby.* Educate yourself and choose the best feeding option for your baby. Make a list of people who will guide, support, and be a resource for you as your care for and feed your baby. (Friends that have breastfed or are currently breastfeeding, lactation consultants, breastfeeding support groups, etc.) Consider a postpartum doula. (These websites can give you information: dona.org & https://shea.org/) Seek out local  breastfeeding resources like the breastfeeding support group at [employer] or [employer]. ______________________________________________________________________________________________________________________________________________________________________________________________________________________________________________________________________________________________________________________________________________________________________________________________________  GAITHER AND ERRANDS Who can help with a thorough cleaning before baby is born? Make a list of people who will help with housekeeping and chores, like laundry, light cleaning, dishes, bathrooms, etc. Who can run some errands for you? What can you do to make sure you are stocked with basic supplies before baby is born? Who is going to do the shopping? ______________________________________________________________________________________________________________________________________________________________________________________________________________________________________________________________________________________________________________________________________________________________________________________________________     Family Adjustment *Nurture yourselves.it helps parents be more loving and allows for better bonding with their child.* What sorts of things do you and partner enjoy doing together? Which activities help you to connect and strengthen your relationship? Make a list of those things. Make a list of people whom you trust to care for your baby so you can have some time together as a couple. What types of things help partner feel connected to Mom? Make a list. What needs will partner have in order to bond with baby? Other children? Who will care for them when you go into labor and while you are in the hospital? Think about what the needs of your older children might be. Who can help you meet those  needs? In what ways are you helping them prepare for bringing baby home? List some specific strategies you have for family adjustment. _______________________________________________________________________________________________________________________________________________________________________________________________________________________________________________________________________________________________________________________________________________  SUPPORT *Someone who can empathize with experiences normalizes your problems and makes them more bearable.* Make a list of other friends, neighbors, and/or co-workers you know with infants (and small children, if applicable) with whom you can connect. Make a list of local or online support groups, mom groups, etc. in which you can be involved. ______________________________________________________________________________________________________________________________________________________________________________________________________________________________________________________________________________________________________________________________________________________________________________________________________  Childcare Plans Investigate and plan for childcare if mom is returning to work. Talk about mom's concerns about her transition back to work. Talk about partner's concerns regarding this transition.  Mental Health *Your mental health is one of the highest priorities for a pregnant or postpartum mom.* 1 in 5 women experience anxiety and/or depression from the time of conception through the first year after birth. Postpartum Mood Disorders are the #1 complication of pregnancy and childbirth and the suffering experienced by these mothers is not necessary! These illnesses are temporary and respond well to treatment, which often includes self-care, social support, talk therapy, and medication when needed. Women  experiencing anxiety and depression often say things like: "I'm supposed to be happy.why do I feel so sad?", "Why can't I snap out of it?", "I'm having thoughts that scare me." There is no need to be embarrassed if you are feeling these symptoms: Overwhelmed, anxious, angry, sad, guilty, irritable, hopeless, exhausted but can't sleep You are NOT alone. You are NOT to blame. With help, you WILL be well. Where can I find help? Medical professionals such as your OB, midwife, gynecologist, family practitioner, primary care provider, pediatrician, or mental health providers; [facility] support groups: Feelings After Birth, Breastfeeding Support Group, Baby and Me Group, and Fit 4 Two exercise classes. You have permission to ask  for help. It will confirm your feelings, validate your experiences, share/learn coping strategies, and gain support and encouragement as you heal. You are important! BRAINSTORM Make a list of local resources, including resources for mom and for partner. Identify support groups. Identify people to call late at night - include names and contact info. Talk with partner about perinatal mood and anxiety disorders. Talk with your OB, midwife, and doula about baby blues and about perinatal mood and anxiety disorders. Talk with your pediatrician about perinatal mood and anxiety disorders.   Support & Sanity Savers   What do you really need?  Basics In preparing for a new baby, many expectant parents spend hours shopping for baby clothes, decorating the nursery, and deciding which car seat to buy. Yet most don't think much about what the reality of parenting a newborn will be like, and what they need to make it through that. So, here is the advice of experienced parents. We know you'll read this, and think "they're exaggerating, I don't really need that." Just trust us  on these, OK? Plan for all of this, and if it turns out you don't need it, come back and teach us  how you did  it!  Must-Haves (Once baby's survival needs are met, make sure you attend to your own survival needs!) Sleep An average newborn sleeps 16-18 hours per day, over 6-7 sleep periods, rarely more than three hours at a time. It is normal and healthy for a newborn to wake throughout the night... but really hard on parents!! Naps. Prioritize sleep above any responsibilities like: cleaning house, visiting friends, running errands, etc.  Sleep whenever baby sleeps. If you can't nap, at least have restful times when baby eats. The more rest you get, the more patient you will be, the more emotionally stable, and better at solving problems.  Food You may not have realized it would be difficult to eat when you have a newborn. Yet, when we talk to countless new parents, they say things like "it may be 2:00 pm when I realize I haven't had breakfast yet." Or "every time we sit down to dinner, baby needs to eat, and my food gets cold, so I don't bother to eat it." Finger food. Before your baby is born, stock up with one months' worth of food that: 1) you can eat with one hand while holding a baby, 2) doesn't need to be prepped, 3) is good hot or cold, 4) doesn't spoil when left out for a few hours, and 5) you like to eat. Think about: nuts, dried fruit, Clif bars, pretzels, jerky, gogurt, baby carrots, apples, bananas, crackers, cheez-n-crackers, string cheese, hot pockets or frozen burritos to microwave, garden burgers and breakfast pastries to put in the toaster, yogurt drinks, etc. Restaurant Menus. Make lists of your favorite restaurants & menu items. When family/friends want to help, you can give specific information without much thought. They can either bring you the food or send gift cards for just the right meals. Freezer Meals.  Take some time to make a few meals to put in the freezer ahead of time.  Easy to freeze meals can be anything such as soup, lasagna, chicken pie, or spaghetti sauce. Set up a Meal  Schedule.  Ask friends and family to sign up to bring you meals during the first few weeks of being home. (It can be passed around at baby showers!) You have no idea how helpful this will be until you are in the throes of  parenting.  MachineLive.it is a great website to check out. Emotional Support Know who to call when you're stressed out. Parenting a newborn is very challenging work. There are times when it totally overwhelms your normal coping abilities. EVERY NEW PARENT NEEDS TO HAVE A PLAN FOR WHO TO CALL WHEN THEY JUST CAN'T COPE ANY MORE. (And it has to be someone other than the baby's other parent!) Before your baby is born, come up with at least one person you can call for support - write their phone number down and post it on the refrigerator. Anxiety & Sadness. Baby blues are normal after pregnancy; however, there are more severe types of anxiety & sadness which can occur and should not be ignored.  They are always treatable, but you have to take the first step by reaching out for help. [facility] offers a "Mom Talk" group which meets every Tuesday from 10 am - 11 am.  This group is for new moms who need support and connection after their babies are born.  Call [phone].  Really, Really Helpful (Plan for them! Make sure these happen often!!) Physical Support with Taking Care of Yourselves Asking friends and family. Before your baby is born, set up a schedule of people who can come and visit and help out (or ask a friend to schedule for you). Any time someone says "let me know what I can do to help," sign them up for a day. When they get there, their job is not to take care of the baby (that's your job and your joy). Their job is to take care of you!  Postpartum doulas. If you don't have anyone you can call on for support, look into postpartum doulas:  professionals at helping parents with caring for baby, caring for themselves, getting breastfeeding started, and helping with  household tasks. www.padanc.org is a helpful website for learning about doulas in our area. Peer Support / Parent Groups Why: One of the greatest ideas for new parents is to be around other new parents. Parent groups give you a chance to share and listen to others who are going through the same season of life, get a sense of what is normal infant development by watching several babies learn and grow, share your stories of triumph and struggles with empathetic ears, and forgive your own mistakes when you realize all parents are learning by trial and error. Where to find: There are many places you can meet other new parents throughout our community.  [facility] offers the following classes for new moms and their little ones:  Baby and Me (Birth to Crawling) and Breastfeeding Support Group. Go to www.conehealthybaby.com or call [phone] for more information. Time for your Relationship It's easy to get so caught up in meeting baby's immediate needs that it's hard to find time to connect with your partner, and meet the needs of your relationship. It's also easy to forget what "quality time with your partner" actually looks like. If you take your baby on a date, you'd be amazed how much of your couple time is spent feeding the baby, diapering the baby, admiring the baby, and talking about the baby. Dating: Try to take time for just the two of you. Babysitter tip: Sometimes when moms are breastfeeding a newborn, they find it hard to figure out how to schedule outings around baby's unpredictable feeding schedules. Have the babysitter come for a three hour period. When she comes over, if baby has just eaten, you can leave  right away, and come back in two hours. If baby hasn't fed recently, you start the date at home. Once baby gets hungry and gets a good feeding in, you can head out for the rest of your date time. Date Nights at Home: If you can't get out, at least set aside one evening a week to prioritize  your relationship: whenever baby dozes off or doesn't have any immediate needs, spend a little time focusing on each other. Potential conflicts: The main relationship conflicts that come up for new parents are: issues related to sexuality, financial stresses, a feeling of an unfair division of household tasks, and conflicts in parenting styles. The more you can work on these issues before baby arrives, the better!  Fun and Frills (Don't forget these. and don't feel guilty for indulging in them!) Everyone has something in life that is a fun little treat that they do just for themselves. It may be: reading the morning paper, or going for a daily jog, or having coffee with a friend once a week, or going to a movie on Friday nights, or fine chocolates, or bubble baths, or curling up with a good book. Unless you do fun things for yourself every now and then, it's hard to have the energy for fun with your baby. Whatever your "special" treats are, make sure you find a way to continue to indulge in them after your baby is born. These special moments can recharge you, and allow you to return to baby with a new joy   PERINATAL MOOD DISORDERS: MATERNAL MENTAL HEALTH FROM CONCEPTION THROUGH THE POSTPARTUM PERIOD   _________________________________________Emergency and Crisis Resources If you are an imminent risk to self or others, are experiencing intense personal distress, and/or have noticed significant changes in activities of daily living, call:  911 [facility]: [phone]  [address] [place], [place], [address] Mobile Crisis: [phone] National Suicide Hotline: 48 Or visit the following crisis centers: Local Emergency Departments [facility]:  [address] [place]  Mon-Friday 8am-3pm, [phone]                                                                                  ___________ Non-Crisis Resources To identify specific providers that are covered by your  insurance, contact your insurance company or local agencies:   Postpartum Support International- Warm-line: 606-876-7312                                                      __Outpatient Therapy and Medication Management   Providers:  [facility]: [phone] Hours: 9AM-5PM  Insurance Accepted: BRYAN [place], BCBS, Sherleen Hough, [place], Medicare  [facility]): [phone] Hours: 8AM-5:30PM  nsurance Accepted: All insurances EXCEPT AARP, [place], [place], and [employer] of the [place]: [phone] Hours: 8AM-8PM Insurance Accepted: [place] OLIPHANT, Sherleen Hough, [place], Medicare, Delano Gasper [facility] [facility]504 [phone] [facility]: [phone] Hours: 8:30AM-7PM Insurance Accepted: [place] OLIPHANT, Medicaid, Medicare, Tricare, [employer] Counsel[facility]:  [phone]   Hours:9AM-5PM Insurance Accepted:  [place], CHARON, [employer], [place], [facility]: [phone] Hours: 9AM-5:30PM Insurance Accepted: BRYAN [place], CHARON [place], and [place], Medicare, [facility]:  [phone] Hours: 9am-5pm Insurance Accepted: BCBS; they do not accept Medicaid/Medicare [facility]: [phone] Hours: 9am-9pm Insurance Accepted: All major insurance including Medicaid and Medicare [facility]: [phone] Hours: 9AM- 5PM Insurance Accepted: All insurances EXCEPT Medicaid and Medicare. [facility]: [phone]   ____________                                                                     Parenting Support Groups [facility] :  [address], [place], [place], [address] [phone] [facility]:  [phone] Family Support Network: (support for children in the NICU and/or with special needs),  [phone]     _____________                                                                                  Online Resources Postpartum Support International: SeekAlumni.co.za  800-944-4PPD Supporting Moms:  www.momssupportingmoms.net

## 2023-11-11 NOTE — Progress Notes (Unsigned)
   Subjective:   Michelle Stephens is a 23 y.o. G4P0030 here today for ongoing prenatal care, substance use management and substance exposed newborn care preparation.  Michelle Stephens arrives today with the expectation of planning an in hospital buprenorphine  induction. She has housing and pet care concerns prior to her admission.  Health Maintenance Due  Topic Date Due   HPV VACCINES (1 - 3-dose series) Never done   DTaP/Tdap/Td (1 - Tdap) Never done   Hepatitis B Vaccines 19-59 Average Risk (1 of 3 - 19+ 3-dose series) Never done   Influenza Vaccine  Never done   COVID-19 Vaccine (1 - 2024-25 season) Never done    Past Medical History:  Diagnosis Date   ADHD (attention deficit hyperactivity disorder)    Allergy    seasonal   Anxiety    Asthma    when young   Depression    Eczema    GERD (gastroesophageal reflux disease)    Headache(784.0)    Heart murmur    when a baby   JRA (juvenile rheumatoid arthritis) (HCC)    Obesity    Urinary tract infection    Vision abnormalities     Past Surgical History:  Procedure Laterality Date   WISDOM TOOTH EXTRACTION  2019    The following portions of the patient's history were reviewed and updated as appropriate: allergies, current medications, past family history, past medical history, past social history, past surgical history and problem list.     Objective:   Michelle Stephens is well appearing and communicative.  She has clear thinking and is will to discuss plan of care moving forward.     Assessment and Plan:  Michelle Stephens oriented to to [facility] clinic and resources. Working with peer support to arrange pet care and housing funding while in patient for her buprenorphine  induction.  She has REACH consult contact information if needs arise outside of clinic. Problem List Items Addressed This Visit   None   Routine preventative health maintenance measures emphasized. Please refer to After Visit Summary for other counseling recommendations.    No follow-ups on file.    Total face-to-face time with patient: 10 minutes.  Over 50% of encounter was spent on counseling and coordination of care.   Michelle Stephens, NNP-BC Neonatal Nurse Practitioner Substance Exposed Newborn Consult at the [facility] [phone]

## 2023-11-11 NOTE — Patient Instructions (Signed)

## 2023-11-11 NOTE — Progress Notes (Unsigned)
 Subjective:  Michelle Stephens is a 23 y.o. G4P0030 at [redacted]w[redacted]d being seen today for ongoing prenatal care.  She is currently monitored for the following issues for this high-risk pregnancy and has MDD (major depressive disorder), recurrent episode, severe; PTSD (post-traumatic stress disorder); ADHD (attention deficit hyperactivity disorder), combined type; Self-inflicted injury; Supervision of high risk pregnancy, antepartum; Opioid use disorder, severe, dependence (HCC); Substance abuse affecting pregnancy, antepartum (HCC); History of syphilis; Rubella non-immune status, antepartum; and Housing insecurity on their problem list.  Patient reports no complaints.  Contractions: Not present. Vag. Bleeding: None.  Movement: Present. Denies leaking of fluid.   The following portions of the patient's history were reviewed and updated as appropriate: allergies, current medications, past family history, past medical history, past social history, past surgical history and problem list. Problem list updated.  Objective:   Vitals:   11/11/23 1644  BP: 111/82  Pulse: 89  Weight: 260 lb 14.4 oz (118.3 kg)    Fetal Status: Fetal Heart Rate (bpm): 145   Movement: Present     General:  Alert, oriented and cooperative. Patient is in no acute distress.  Skin: Skin is warm and dry. No rash noted.   Cardiovascular: Normal heart rate noted  Respiratory: Normal respiratory effort, no problems with respiration noted  Abdomen: Soft, gravid, appropriate for gestational age. Pain/Pressure: Absent     Pelvic: Vag. Bleeding: None     Cervical exam deferred        Extremities: Normal range of motion.     Mental Status: Normal mood and affect. Normal behavior. Normal judgment and thought content.   Urinalysis:      PDMP not reviewed this encounter.    Last UDS: Lab Results  Component Value Date   CREATIUR 145 10/14/2023     Assessment and Plan:  Pregnancy: G4P0030 at [redacted]w[redacted]d  1. Supervision of high  risk pregnancy, antepartum (Primary) BP and FHR normal Discussed fasting labs for next visit if below plan does not come through  2. Opioid use disorder, severe, dependence (HCC) At last visit tentative plan to get rent paid and cat taken care of so she could be admitted to get started on suboxone  or methadone (more likely the former due to multitude of issues and preference), as she was having trouble starting it herself UDS at that time with cocaine , THC, fentanyl Today reports last use this AM of fentanyl Discussed we can get rent paid for up to a month and also cat boarding, plan to have her pay tomorrow's rent and then come to the hospital and I will personally admit her Plan: will meet her Monday anytime before 1100 (will call prior to heading there), pay rent, then go to hospital and admit her. No using after Sunday morning. She will let Brittney know by tomorrow if cat siting needs to be arranged. [facility] [address], [address]  3. History of syphilis S/p bicillin x3  4. Housing insecurity See above  5. Rubella non-immune status, antepartum Offer MMR PP  6. Severe episode of recurrent major depressive disorder, without psychotic features (HCC) Seeing Warren, referred to psychiatry as well for possible diagnosis of bipolar In future would like to be scheduled to run concurrent with REACH visits  Preterm labor symptoms and general obstetric precautions including but not limited to vaginal bleeding, contractions, leaking of fluid and fetal movement were reviewed in detail with the patient. Please refer to After Visit Summary for other counseling recommendations.  Return in about  2 weeks (around 11/25/2023) for REACH clinic, ob visit, 28 wk labs.  Future Appointments  Date Time Provider Department Center  11/28/2023  8:40 AM [facility]  11/28/2023 10:15 AM Lola Donnice HERO, MD [facility]  12/02/2023  3:45 PM [facility] HEALTH CLINICIAN [facility]   12/12/2023 11:15 AM [facility] PROVIDER 1 [facility]  12/12/2023 11:30 AM [facility] US4 [facility]     Lola Donnice HERO, MD

## 2023-11-13 ENCOUNTER — Encounter: Payer: Self-pay | Admitting: Family Medicine

## 2023-11-13 ENCOUNTER — Other Ambulatory Visit: Payer: Self-pay | Admitting: *Deleted

## 2023-11-13 ENCOUNTER — Ambulatory Visit: Payer: MEDICAID | Admitting: Obstetrics

## 2023-11-13 ENCOUNTER — Other Ambulatory Visit: Payer: Self-pay | Admitting: Obstetrics and Gynecology

## 2023-11-13 ENCOUNTER — Ambulatory Visit (HOSPITAL_BASED_OUTPATIENT_CLINIC_OR_DEPARTMENT_OTHER): Payer: MEDICAID

## 2023-11-13 VITALS — BP 130/63 | HR 83

## 2023-11-13 DIAGNOSIS — F419 Anxiety disorder, unspecified: Secondary | ICD-10-CM | POA: Insufficient documentation

## 2023-11-13 DIAGNOSIS — O99212 Obesity complicating pregnancy, second trimester: Secondary | ICD-10-CM

## 2023-11-13 DIAGNOSIS — F1120 Opioid dependence, uncomplicated: Secondary | ICD-10-CM

## 2023-11-13 DIAGNOSIS — Z362 Encounter for other antenatal screening follow-up: Secondary | ICD-10-CM | POA: Insufficient documentation

## 2023-11-13 DIAGNOSIS — O365920 Maternal care for other known or suspected poor fetal growth, second trimester, not applicable or unspecified: Secondary | ICD-10-CM

## 2023-11-13 DIAGNOSIS — O99322 Drug use complicating pregnancy, second trimester: Secondary | ICD-10-CM | POA: Insufficient documentation

## 2023-11-13 DIAGNOSIS — O2622 Pregnancy care for patient with recurrent pregnancy loss, second trimester: Secondary | ICD-10-CM | POA: Insufficient documentation

## 2023-11-13 DIAGNOSIS — O98112 Syphilis complicating pregnancy, second trimester: Secondary | ICD-10-CM | POA: Insufficient documentation

## 2023-11-13 DIAGNOSIS — O43892 Other placental disorders, second trimester: Secondary | ICD-10-CM | POA: Insufficient documentation

## 2023-11-13 DIAGNOSIS — F1910 Other psychoactive substance abuse, uncomplicated: Secondary | ICD-10-CM

## 2023-11-13 DIAGNOSIS — O09899 Supervision of other high risk pregnancies, unspecified trimester: Secondary | ICD-10-CM

## 2023-11-13 DIAGNOSIS — E669 Obesity, unspecified: Secondary | ICD-10-CM | POA: Diagnosis not present

## 2023-11-13 DIAGNOSIS — Z3A27 27 weeks gestation of pregnancy: Secondary | ICD-10-CM

## 2023-11-13 DIAGNOSIS — O99342 Other mental disorders complicating pregnancy, second trimester: Secondary | ICD-10-CM | POA: Insufficient documentation

## 2023-11-13 DIAGNOSIS — Z3A25 25 weeks gestation of pregnancy: Secondary | ICD-10-CM | POA: Insufficient documentation

## 2023-11-13 DIAGNOSIS — O43193 Other malformation of placenta, third trimester: Secondary | ICD-10-CM

## 2023-11-13 DIAGNOSIS — O359XX0 Maternal care for (suspected) fetal abnormality and damage, unspecified, not applicable or unspecified: Secondary | ICD-10-CM

## 2023-11-13 DIAGNOSIS — O0990 Supervision of high risk pregnancy, unspecified, unspecified trimester: Secondary | ICD-10-CM

## 2023-11-13 DIAGNOSIS — F1190 Opioid use, unspecified, uncomplicated: Secondary | ICD-10-CM | POA: Insufficient documentation

## 2023-11-13 NOTE — Progress Notes (Signed)
 MFM Consult Note  Michelle Stephens is currently at 25 weeks and 2 days.  She has been followed due to maternal obesity with a BMI of 39.8, maternal treatment with Subutex , and a fetus with a two-vessel umbilical cord.    Her EDC was changed to February 24, 2024 following her last ultrasound exam.  Sonographic findings Single intrauterine pregnancy at 25w 2d.  Fetal cardiac activity:  Observed and appears normal. Presentation: Cephalic. Interval fetal anatomy appears normal. Using an [facility] of February 24, 2024, the estimated fetal weight of 1 pound 10 ounces measures at the 22nd percentile. Amniotic fluid volume: Within normal limits. MVP: 3.84 cm. Placenta: Posterior.  The two-vessel umbilical cord continues to be noted on today's exam.  An intracardiac echogenic focus was noted in the left ventricle of the fetal heart.    The small association between an echogenic focus and Down syndrome was discussed.   Due to the echogenic focus noted today, the patient was offered and declined an amniocentesis today for definitive diagnosis of fetal aneuploidy.  She reports that she is comfortable with her negative cell free DNA test.  There are limitations of prenatal ultrasound such as the inability to detect certain abnormalities due to poor visualization. Various factors such as fetal position, gestational age and maternal body habitus may increase the difficulty in visualizing the fetal anatomy.    Due to the association of congenital heart defects with a two-vessel cord, the patient was referred to [facility] pediatric cardiology for a fetal echocardiogram.    Due to maternal obesity, the two-vessel cord, and treatment with Subutex , we will start weekly fetal testing at 34 weeks.    She will return in 4 weeks for another growth scan.    The patient stated that all of her questions were answered today.  A total of 20 minutes was spent counseling and coordinating the care for this patient.  Greater  than 50% of the time was spent in direct face-to-face contact.

## 2023-11-14 ENCOUNTER — Other Ambulatory Visit: Payer: Self-pay

## 2023-11-14 ENCOUNTER — Inpatient Hospital Stay (HOSPITAL_COMMUNITY)
Admission: AD | Admit: 2023-11-14 | Discharge: 2023-11-16 | DRG: 832 | Disposition: A | Discharge status: Home / Self Care | Payer: MEDICAID | Attending: Obstetrics & Gynecology | Admitting: Obstetrics & Gynecology

## 2023-11-14 ENCOUNTER — Encounter (HOSPITAL_COMMUNITY): Payer: Self-pay | Admitting: Obstetrics & Gynecology

## 2023-11-14 DIAGNOSIS — Z8249 Family history of ischemic heart disease and other diseases of the circulatory system: Secondary | ICD-10-CM | POA: Diagnosis not present

## 2023-11-14 DIAGNOSIS — Z3A25 25 weeks gestation of pregnancy: Secondary | ICD-10-CM | POA: Diagnosis not present

## 2023-11-14 DIAGNOSIS — Z87891 Personal history of nicotine dependence: Secondary | ICD-10-CM | POA: Diagnosis not present

## 2023-11-14 DIAGNOSIS — O99323 Drug use complicating pregnancy, third trimester: Principal | ICD-10-CM | POA: Diagnosis present

## 2023-11-14 DIAGNOSIS — A530 Latent syphilis, unspecified as early or late: Secondary | ICD-10-CM | POA: Diagnosis present

## 2023-11-14 DIAGNOSIS — O99342 Other mental disorders complicating pregnancy, second trimester: Secondary | ICD-10-CM | POA: Diagnosis present

## 2023-11-14 DIAGNOSIS — O0992 Supervision of high risk pregnancy, unspecified, second trimester: Secondary | ICD-10-CM | POA: Diagnosis not present

## 2023-11-14 DIAGNOSIS — Z560 Unemployment, unspecified: Secondary | ICD-10-CM | POA: Diagnosis not present

## 2023-11-14 DIAGNOSIS — K219 Gastro-esophageal reflux disease without esophagitis: Secondary | ICD-10-CM | POA: Diagnosis present

## 2023-11-14 DIAGNOSIS — F419 Anxiety disorder, unspecified: Secondary | ICD-10-CM | POA: Diagnosis present

## 2023-11-14 DIAGNOSIS — F1121 Opioid dependence, in remission: Secondary | ICD-10-CM | POA: Diagnosis present

## 2023-11-14 DIAGNOSIS — O26892 Other specified pregnancy related conditions, second trimester: Secondary | ICD-10-CM

## 2023-11-14 DIAGNOSIS — Z833 Family history of diabetes mellitus: Secondary | ICD-10-CM

## 2023-11-14 DIAGNOSIS — O99612 Diseases of the digestive system complicating pregnancy, second trimester: Secondary | ICD-10-CM | POA: Diagnosis present

## 2023-11-14 DIAGNOSIS — F1120 Opioid dependence, uncomplicated: Secondary | ICD-10-CM | POA: Diagnosis present

## 2023-11-14 DIAGNOSIS — O99322 Drug use complicating pregnancy, second trimester: Secondary | ICD-10-CM | POA: Diagnosis present

## 2023-11-14 DIAGNOSIS — Z59811 Housing instability, housed, with risk of homelessness: Secondary | ICD-10-CM

## 2023-11-14 DIAGNOSIS — F1119 Opioid abuse with unspecified opioid-induced disorder: Secondary | ICD-10-CM | POA: Diagnosis present

## 2023-11-14 DIAGNOSIS — O0990 Supervision of high risk pregnancy, unspecified, unspecified trimester: Secondary | ICD-10-CM

## 2023-11-14 DIAGNOSIS — Z59819 Housing instability, housed unspecified: Secondary | ICD-10-CM

## 2023-11-14 LAB — COMPREHENSIVE METABOLIC PANEL WITH GFR
ALT: 14 U/L (ref 0–44)
AST: 18 U/L (ref 15–41)
Albumin: 2.8 g/dL — ABNORMAL LOW (ref 3.5–5.0)
Alkaline Phosphatase: 58 U/L (ref 38–126)
Anion gap: 11 (ref 5–15)
BUN: 6 mg/dL (ref 6–20)
CO2: 19 mmol/L — ABNORMAL LOW (ref 22–32)
Calcium: 8.6 mg/dL — ABNORMAL LOW (ref 8.9–10.3)
Chloride: 105 mmol/L (ref 98–111)
Creatinine, Ser: 0.57 mg/dL (ref 0.44–1.00)
GFR, Estimated: >60 mL/min (ref >60)
Glucose, Bld: 84 mg/dL (ref 70–99)
Potassium: 3.7 mmol/L (ref 3.5–5.1)
Sodium: 135 mmol/L (ref 135–145)
Total Bilirubin: 0.3 mg/dL (ref 0.0–1.2)
Total Protein: 6.6 g/dL (ref 6.5–8.1)

## 2023-11-14 LAB — CBC
HCT: 33.3 % — ABNORMAL LOW (ref 36.0–46.0)
Hemoglobin: 11.5 g/dL — ABNORMAL LOW (ref 12.0–15.0)
MCH: 27.8 pg (ref 26.0–34.0)
MCHC: 34.5 g/dL (ref 30.0–36.0)
MCV: 80.4 fL (ref 80.0–100.0)
Platelets: 205 K/uL (ref 150–400)
RBC: 4.14 MIL/uL (ref 3.87–5.11)
RDW: 13.2 % (ref 11.5–15.5)
WBC: 9.7 K/uL (ref 4.0–10.5)
nRBC: 0 % (ref 0.0–0.2)

## 2023-11-14 LAB — TYPE AND SCREEN
ABO/RH(D): B POS
Antibody Screen: NEGATIVE

## 2023-11-14 LAB — RAPID HIV SCREEN (HIV 1/2 AB+AG)
HIV 1/2 Antibodies: NONREACTIVE
HIV-1 P24 Antigen - HIV24: NONREACTIVE

## 2023-11-14 MED ORDER — ACETAMINOPHEN 325 MG PO TABS: 650.0000 mg | ORAL_TABLET | ORAL | Status: DC | PRN

## 2023-11-14 MED ORDER — CLONIDINE HCL 0.1 MG PO TABS: 0.1000 mg | ORAL_TABLET | Freq: Two times a day (BID) | ORAL | Status: DC | PRN

## 2023-11-14 MED ORDER — CYCLOBENZAPRINE HCL 10 MG PO TABS
10.0000 mg | ORAL_TABLET | Freq: Three times a day (TID) | ORAL | Status: DC | PRN
  Administered 2023-11-14 – 2023-11-16 (×3): 10 mg via ORAL
  Filled 2023-11-14 (×3): qty 1

## 2023-11-14 MED ORDER — DOCUSATE SODIUM 100 MG PO CAPS
100.0000 mg | ORAL_CAPSULE | Freq: Every day | ORAL | Status: DC
Start: 2023-11-14 — End: 2023-11-16
  Administered 2023-11-14: 100 mg via ORAL
  Filled 2023-11-14 (×2): qty 1

## 2023-11-14 MED ORDER — ONDANSETRON HCL 4 MG/2ML IJ SOLN
4.0000 mg | Freq: Four times a day (QID) | INTRAMUSCULAR | Status: DC | PRN
  Administered 2023-11-15 – 2023-11-16 (×3): 4 mg via INTRAVENOUS
  Filled 2023-11-14 (×3): qty 2

## 2023-11-14 MED ORDER — LOPERAMIDE HCL 2 MG PO CAPS: 4.0000 mg | ORAL_CAPSULE | ORAL | Status: DC | PRN

## 2023-11-14 MED ORDER — PANTOPRAZOLE SODIUM 40 MG IV SOLR
40.0000 mg | INTRAVENOUS | Status: DC
Start: 2023-11-14 — End: 2023-11-16
  Administered 2023-11-15: 40 mg via INTRAVENOUS
  Filled 2023-11-14: qty 10

## 2023-11-14 MED ORDER — PRENATAL MULTIVITAMIN CH
1.0000 | ORAL_TABLET | Freq: Every day | ORAL | Status: DC
  Administered 2023-11-15 – 2023-11-16 (×2): 1 via ORAL
  Filled 2023-11-14 (×2): qty 1

## 2023-11-14 MED ORDER — ZOLPIDEM TARTRATE 5 MG PO TABS
5.0000 mg | ORAL_TABLET | Freq: Every evening | ORAL | Status: DC | PRN
  Administered 2023-11-14 – 2023-11-16 (×3): 5 mg via ORAL
  Filled 2023-11-14 (×3): qty 1

## 2023-11-14 MED ORDER — BUPRENORPHINE HCL-NALOXONE HCL 8-2 MG SL SUBL
2.0000 | SUBLINGUAL_TABLET | Freq: Once | SUBLINGUAL | Status: AC | PRN
Start: 2023-11-14 — End: 2023-11-15
  Administered 2023-11-15: 2 via SUBLINGUAL
  Filled 2023-11-14: qty 2

## 2023-11-14 MED ORDER — CALCIUM CARBONATE ANTACID 500 MG PO CHEW
2.0000 | CHEWABLE_TABLET | ORAL | Status: DC | PRN
  Administered 2023-11-15: 400 mg via ORAL
  Filled 2023-11-14: qty 2

## 2023-11-14 MED ORDER — HYDROXYZINE HCL 50 MG PO TABS
25.0000 mg | ORAL_TABLET | Freq: Four times a day (QID) | ORAL | Status: DC | PRN
  Administered 2023-11-14 – 2023-11-16 (×3): 25 mg via ORAL
  Filled 2023-11-14 (×3): qty 1

## 2023-11-14 MED ORDER — BUPRENORPHINE HCL-NALOXONE HCL 8-2 MG SL SUBL
1.0000 | SUBLINGUAL_TABLET | Freq: Two times a day (BID) | SUBLINGUAL | Status: DC
  Administered 2023-11-15: 1 via SUBLINGUAL
  Filled 2023-11-14: qty 1

## 2023-11-14 MED ORDER — DICYCLOMINE HCL 10 MG PO CAPS
10.0000 mg | ORAL_CAPSULE | Freq: Three times a day (TID) | ORAL | Status: DC | PRN
  Administered 2023-11-15: 10 mg via ORAL
  Filled 2023-11-14 (×2): qty 1

## 2023-11-14 MED ORDER — BUPRENORPHINE HCL-NALOXONE HCL 8-2 MG SL SUBL
1.0000 | SUBLINGUAL_TABLET | SUBLINGUAL | Status: AC | PRN
  Administered 2023-11-15 (×2): 1 via SUBLINGUAL
  Filled 2023-11-14 (×2): qty 1

## 2023-11-14 MED ORDER — LACTATED RINGERS IV SOLN: 125.0000 mL/h | INTRAVENOUS | Status: AC

## 2023-11-14 NOTE — H&P (Signed)
 FACULTY PRACTICE ANTEPARTUM ADMISSION HISTORY AND PHYSICAL NOTE   History of Present Illness: Michelle Stephens is a 23 y.o. G4P0030 at [redacted]w[redacted]d admitted for management of opioid use disorder.    Patient reports the fetal movement as active. Patient reports uterine contraction  activity as none. Patient reports  vaginal bleeding as none. Patient describes fluid per vagina as None. Fetal presentation is unknown.  Patient well known to me from [facility]. Has significant difficulties with housing. Outpatient plan made to have her rent paid for by the [facility] fund so that she could be admitted to hospital to be started on buprenorphine  as she found she was unable to do so successfully as an outpatient on her own. This was done and she arrived at the hospital earlier today for buprenorphine  induction.   She reports she last used intranasal fentanyl yesterday afternoon. She reports she is starting to have some symptoms of withdrawal, primarily chills and a hot/cold feeling. Denies nausea, doesn't usually get it. Doesn't have muscle aches yet but knows she will soon.    Patient Active Problem List   Diagnosis Date Noted   Housing insecurity 10/07/2023   Rubella non-immune status, antepartum 09/24/2023   Supervision of high risk pregnancy, antepartum 09/23/2023   Opioid use disorder, severe, dependence (HCC) 09/23/2023   Substance abuse affecting pregnancy, antepartum (HCC) 09/23/2023   History of syphilis 09/23/2023   Self-inflicted injury    PTSD (post-traumatic stress disorder) 02/04/2013   ADHD (attention deficit hyperactivity disorder), combined type 02/04/2013   MDD (major depressive disorder), recurrent episode, severe 11/07/2011    Class: Diagnosis of    Past Medical History:  Diagnosis Date   ADHD (attention deficit hyperactivity disorder)    Allergy    seasonal   Anxiety    Asthma    when young   Depression    Eczema    GERD (gastroesophageal reflux disease)     Headache(784.0)    Heart murmur    when a baby   JRA (juvenile rheumatoid arthritis) (HCC)    Obesity    Urinary tract infection    Vision abnormalities     Past Surgical History:  Procedure Laterality Date   WISDOM TOOTH EXTRACTION  2019    OB History  Gravida Para Term Preterm AB Living  4 0 0 0 3 0  SAB IAB Ectopic Multiple Live Births  3 0 0 0 0    # Outcome Date GA Lbr Len/2nd Weight Sex Type Anes PTL Lv  4 Current           3 SAB           2 SAB           1 SAB             Social History   Socioeconomic History   Marital status: Single    Spouse name: Not on file   Number of children: Not on file   Years of education: Not on file   Highest education level: Not on file  Occupational History   Occupation: Dentist: UNEMPLOYED    Comment: 7th grade at [employer]  Tobacco Use   Smoking status: Former    Current packs/day: 0.25    Types: Cigarettes   Smokeless tobacco: Former  Building services engineer status: Every Day   Substances: Nicotine , Flavoring  Substance and Sexual Activity   Alcohol use: Not Currently   Drug use: Yes  Types: Marijuana, Cocaine , Fentanyl   Sexual activity: Yes    Birth control/protection: None  Other Topics Concern   Not on file  Social History Narrative   Not on file   Social Drivers of Health   Financial Resource Strain: Not on file  Food Insecurity: Not on file  Transportation Needs: Not on file  Physical Activity: Not on file  Stress: Not on file  Social Connections: Not on file    Family History  Problem Relation Age of Onset   Asthma Mother    Diabetes Mother    Hypertension Mother    Depression Mother    Mental illness Mother    Kidney failure Mother        stage 4 at time of death   Heart failure Mother        stage 4 at time of death   Liver disease Mother        at time of death   Other Father        unknown    Allergies  Allergen Reactions   Coconut Flavoring Agent  (Non-Screening) Hives    Medications Prior to Admission  Medication Sig Dispense Refill Last Dose/Taking   acetaminophen  (TYLENOL ) 500 MG tablet Take 1,000 mg by mouth every 6 (six) hours as needed for mild pain, moderate pain or headache. (Patient not taking: Reported on 09/30/2023)      calcium  carbonate (TUMS - DOSED IN MG ELEMENTAL CALCIUM ) 500 MG chewable tablet Chew 1 tablet by mouth as needed for indigestion or heartburn.      dicyclomine  (BENTYL ) 10 MG capsule Take 1 capsule (10 mg total) by mouth 3 (three) times daily as needed (abdominal spasms). (Patient not taking: Reported on 09/30/2023) 30 capsule 0    famotidine  (PEPCID ) 20 MG tablet Take 1 tablet (20 mg total) by mouth 2 (two) times daily. 60 tablet 0    naloxone  (NARCAN ) nasal spray 4 mg/0.1 mL Use as needed to reverse opioid overdose 2 each 11    ondansetron  (ZOFRAN -ODT) 4 MG disintegrating tablet Take 1-2 tablets (4-8 mg total) by mouth every 8 (eight) hours as needed for nausea. 40 tablet 2    Prenatal Vit-Fe Fumarate-FA (MULTIVITAMIN-PRENATAL) 27-0.8 MG TABS tablet Take 1 tablet by mouth daily at 12 noon. 30 tablet 12    senna (SENOKOT) 8.6 MG TABS tablet Take 2 tablets (17.2 mg total) by mouth daily as needed for mild constipation. 60 tablet 0     Review of Systems Pertinent positives and negative per HPI, all others reviewed and negative   Vitals:  BP 137/69 (BP Location: Right Arm)   Pulse 91   Temp 98.6 F (37 C) (Oral)   Resp 19   Ht 5' 6 (1.676 m)   Wt 117.9 kg   LMP 05/07/2023 (Exact Date)   SpO2 99%   BMI 41.97 kg/m   Physical Exam Vitals reviewed.  Constitutional:      General: She is not in acute distress.    Appearance: She is well-developed. She is not diaphoretic.  Cardiovascular:     Rate and Rhythm: Normal rate and regular rhythm.     Heart sounds: Normal heart sounds. No murmur heard. Pulmonary:     Effort: Pulmonary effort is normal. No respiratory distress.     Breath sounds: Normal breath  sounds. No wheezing or rales.  Abdominal:     General: Bowel sounds are normal. There is no distension.     Palpations: Abdomen is soft.  Tenderness: There is no abdominal tenderness. There is no guarding or rebound.  Skin:    General: Skin is warm and dry.  Neurological:     Mental Status: She is alert.     Coordination: Coordination normal.     Fetal Heart Rate Tracing Interpretation Fetal Monitoring:Baseline: 145 bpm, Variability: Good {> 6 bpm), Accelerations: Reactive, and Decelerations: Absent Tocometer: Flat Cat: I  Labs:  No results found for this or any previous visit (from the past 24 hours).  Imaging Studies: US  MFM OB FOLLOW UP Result Date: 11/13/2023 ----------------------------------------------------------------------  OBSTETRICS REPORT                       (Signed Final 11/13/2023 01:53 pm) ---------------------------------------------------------------------- Patient Info  ID #:       [id number]                          D.O.B.:  December 20, 2000 (23 yrs)(F)  Name:       [facility]              Visit Date: 11/13/2023 11:44 am ---------------------------------------------------------------------- Performed By  Attending:        Steffan Keys MD         Ref. Address:     [address]                                                             [place], [place]                                                             [address]  Performed By:     Comer Harrow       [facility]  Referred By:      [facility] ---------------------------------------------------------------------- Orders  #  Description  Code        Ordered By  1  US  MFM OB FOLLOW UP                   [id number]    [facility]  ----------------------------------------------------------------------  #  Order #                     Accession #                Episode #  1  [id number]                   [id number]                 [id number] ---------------------------------------------------------------------- Indications  2 vessel umbilical cord                        O69.89X0  Obesity complicating pregnancy, second         O99.212  trimester (BMI 39.4)  Drug use complicating pregnancy, second        N00.677  trimester (Opioid use)  Poor obstetric history-Recurrent (habitual)    O26.20  abortion (3 consecutive ab's)  Anxiety during pregnancy, second trimester     O99.342, F41.9  Syphilis complicating pregnancy, second        O98.112  trimester  Antenatal follow-up for nonvisualized fetal    Z36.2  anatomy  [redacted] weeks gestation of pregnancy                Z3A.25  Low Risk NIPS ---------------------------------------------------------------------- Vital Signs  BP:          130/63 ---------------------------------------------------------------------- Fetal Evaluation  Num Of Fetuses:         1  Fetal Heart Rate(bpm):  125  Cardiac Activity:       Observed  Presentation:           Cephalic  Placenta:               Posterior  P. Cord Insertion:      Previously seen  Amniotic Fluid  AFI FV:      Within normal limits                              Largest Pocket(cm)                              3.84 ---------------------------------------------------------------------- Biometry  BPD:      62.6  mm     G. Age:  25w 3d         45  %    CI:        76.35   %    70 - 86                                                          FL/HC:      20.4   %    18.7 - 20.3  HC:       227   mm     G. Age:  24w 5d         13  %    HC/AC:  1.15        1.04 - 1.22  AC:      197.1  mm     G. Age:  24w 3d         16  %    FL/BPD:     74.0   %    71 - 87  FL:       46.3  mm     G. Age:  25w 3d         39  %    FL/AC:      23.5   %    20 - 24  HUM:      40.5  mm     G. Age:   24w 4d         27  %  Est. FW:     740  gm    1 lb 10 oz      22  % ---------------------------------------------------------------------- OB History  Blood Type:   B+  Gravidity:    4          SAB:   3  Living:       0 ---------------------------------------------------------------------- Gestational Age  LMP:           27w 1d        Date:  05/07/23                  EDD:   02/11/24  U/S Today:     25w 0d                                        EDD:   02/26/24  Best:          25w 2d     Det. By:  U/S  (10/10/23)          EDD:   02/24/24 ---------------------------------------------------------------------- Targeted Anatomy  Central Nervous System  Calvarium/Cranial V.:  Appears normal         Cereb./Vermis:          Previously seen  Cavum:                 Previously seen        Sales executive:         Previously seen  Lateral Ventricles:    Previously seen        Midline Falx:           Previously seen  Choroid Plexus:        Previously seen  Spine  Cervical:              Appears normal         Sacral:                 Appears normal  Thoracic:              Appears normal         Shape/Curvature:        Appears normal  Lumbar:                Appears normal  Head/Neck  Lips:                  Previously seen        Profile:                Previously seen  Neck:                  Appears normal         Orbits/Eyes:            Previously seen  Nuchal Fold:           Previously seen        Mandible:               Previously seen  Nasal Bone:            Previously seen        Maxilla:                Previously seen  Thorax  4 Chamber View:        Not well visualized    Interventr. Septum:     Not well visualized  Cardiac Rhythm:        Normal                 Cardiac Axis:           Not well visualized  Cardiac Situs:         Not well visualized    Diaphragm:              Not well visualized  Rt Outflow Tract:      Not well visualized    3 Vessel View:          Not well visualized  Lt Outflow Tract:      Not well visualized    3  V Trachea View:       Not well visualized  Aortic Arch:           Previously seen        IVC:                    Previously seen  Ductal Arch:           Previously seen        Crossing:               Not well visualized  SVC:                   Previously seen  Abdomen  Ventral Wall:          Previously seen        Lt Kidney:              Appears normal  Cord Insertion:        Previously seen        Rt Kidney:              Appears normal  Situs:                 Previously seen        Bladder:                Appears normal  Stomach:               Appears normal  Extremities  Lt Humerus:            Previously seen        Lt Femur:               Previously seen  Rt Humerus:            Previously seen        Rt Femur:  Previously seen  Lt Forearm:            Previously seen        Lt Lower Leg:           Previously seen  Rt Forearm:            Previously seen        Rt Lower Leg:           Previously seen  Lt Hand:               Open hand nml          Lt Foot:                Nml heel/foot  Rt Hand:               Open hand nml          Rt Foot:                Nml heel/foot  Other  Umbilical Cord:        Single UA              Genitalia:              Female prev seen  Comment:     Technically difficult due to maternal habitus and fetal position. ---------------------------------------------------------------------- Cervix Uterus Adnexa  Cervix  Not visualized (advanced GA >24wks) ---------------------------------------------------------------------- Comments  Athina Hermida is currently at 25 weeks and 2 days.  She  has been followed due to maternal obesity with a BMI of 39.8,  maternal treatment with Subutex , and a fetus with a two-  vessel umbilical cord.  Her EDC was changed to February 24, 2024 following her  last ultrasound exam.  Sonographic findings  Single intrauterine pregnancy at 25w 2d.  Fetal cardiac activity:  Observed and appears normal.  Presentation: Cephalic.  Interval fetal anatomy appears  normal.  Using an [facility] of February 24, 2024, the estimated fetal  weight of 1 pound 10 ounces measures at the 22nd  percentile.  Amniotic fluid volume: Within normal limits. MVP: 3.84 cm.  Placenta: Posterior.  The two-vessel umbilical cord continues to be noted on  today's exam.  An intracardiac echogenic focus was noted in the left  ventricle of the fetal heart.  The small association between an echogenic focus and  Down syndrome was discussed.  Due to the echogenic focus noted today, the patient was  offered and declined an amniocentesis today for definitive  diagnosis of fetal aneuploidy.  She reports that she is  comfortable with her negative cell free DNA test.  There are limitations of prenatal ultrasound such as the  inability to detect certain abnormalities due to poor  visualization. Various factors such as fetal position,  gestational age and maternal body habitus may increase the  difficulty in visualizing the fetal anatomy.  Due to the association of congenital heart defects with a two-  vessel cord, the patient was referred to [facility] pediatric  cardiology for a fetal echocardiogram.  Due to maternal obesity, the two-vessel cord, and treatment  with Subutex , we will start weekly fetal testing at 34 weeks.  She will return in 4 weeks for another growth scan.  The patient stated that all of her questions were answered  today.  A total of 20 minutes was spent counseling and coordinating  the care for this patient.  Greater than 50% of the time was  spent in direct face-to-face  contact. ----------------------------------------------------------------------                   Steffan Keys, MD Electronically Signed Final Report   11/13/2023 01:53 pm ----------------------------------------------------------------------     Assessment and Plan: Patient Active Problem List   Diagnosis Date Noted   Housing insecurity 10/07/2023   Rubella non-immune status, antepartum 09/24/2023   Supervision of high risk  pregnancy, antepartum 09/23/2023   Opioid use disorder, severe, dependence (HCC) 09/23/2023   Substance abuse affecting pregnancy, antepartum (HCC) 09/23/2023   History of syphilis 09/23/2023   Self-inflicted injury    PTSD (post-traumatic stress disorder) 02/04/2013   ADHD (attention deficit hyperactivity disorder), combined type 02/04/2013   MDD (major depressive disorder), recurrent episode, severe 11/07/2011    Class: Diagnosis of    #Opioid use disorder Admit to Antenatal Patient amenable to start suboxone  induction.  This requires a high level of inpatient care due to risk of preterm labor during this process, though this risk is very low and I have cleared this admission with NICU.   Orders placed, induction to proceed as follows: - Patient will wait as long as she possibly can and have as severe of symptoms as she can tolerate prior to initiating buprenorphine , ideally COWS>12 but overall she can be given first dose whenever she says she is ready - When patient states she is ready, give an initial dose of 16 mg - reassess within 45-60 minutes. If patient reports symptoms are not captured or COWS>8, give an additional 8 mg of suboxone . If symptoms are captured no further intervention. - reassess within 45-60 minutes. If patient reports symptoms are not captured or COWS>8, give an additional 8 mg of suboxone .  - If 32 mg of suboxone  do not capture her symptoms, contact me for recommendations - Whatever the total amount of medication that is needed to capture her symptoms is, make that her total daily dose in split dosing the following day. I.e., if symptoms are controlled with 16 mg, prescribe 8 mg BID to start the following day. If 24 mg are required, prescribe 8 mg TID to start the following day, and if 32 mg are required then write 8 mg QID to start the following day.  - once at a stable dose for 24 hours she can be discharged with outpatient follow up at [facility] clinic  Additional  orders: - monitor with COWS q4h - zofran  4mg  q6h prn for nauesa - imodium  4 mg PRN for diarrhea - flexeril  10 mg TID PRN for muscle spasm - bentyl  10 mg TID PRN for abdominal pain - clonidine  0.1 mg BID PRN for anxiety - IV protonix  for reflux [facility] as well as send out ToxAssure Flex 15 to be collected, patient gave verbal consent for this  #Housing insecurity TOC consult  #Supervision of high risk pregnancy Daily NST Needs glucola, ordered for 1 hr GTT in AM however if still not comfortable at that time it can be deferred Will also obtain CBC, HIV, RPR    Donnice CHRISTELLA Carolus, MD, MPH, FAAFP Attending Family Medicine Physician, [facility] for [facility], [facility]

## 2023-11-15 DIAGNOSIS — A530 Latent syphilis, unspecified as early or late: Secondary | ICD-10-CM | POA: Diagnosis present

## 2023-11-15 LAB — RPR
RPR Ser Ql: REACTIVE — AB
RPR Titer: 1:1 {titer}

## 2023-11-15 MED ORDER — BUPRENORPHINE HCL-NALOXONE HCL 8-2 MG SL SUBL
1.0000 | SUBLINGUAL_TABLET | Freq: Three times a day (TID) | SUBLINGUAL | Status: DC
  Administered 2023-11-15 – 2023-11-16 (×3): 1 via SUBLINGUAL
  Filled 2023-11-15 (×3): qty 1

## 2023-11-15 MED ORDER — BUPRENORPHINE HCL-NALOXONE HCL 8-2 MG SL SUBL
2.0000 | SUBLINGUAL_TABLET | Freq: Once | SUBLINGUAL | Status: AC | PRN
  Administered 2023-11-15: 2 via SUBLINGUAL
  Filled 2023-11-15: qty 2

## 2023-11-15 NOTE — Progress Notes (Signed)
 Patient ID: Michelle Stephens, female   DOB: 2000-04-30, 23 y.o.   MRN: [id number] FACULTY PRACTICE ANTEPARTUM(COMPREHENSIVE) NOTE  Michelle Stephens is a 76 y.o. G4P0030 with Estimated Date of Delivery: 02/24/24   By  midtrimester ultrasound [redacted]w[redacted]d  who is admitted for suboxone  therapy initiation.    Fetal presentation is unsure. Length of Stay:  1  Days  Date of admission:11/14/2023  Subjective: Pt wants to sleep  Feels shaky vitals are all stable Patient reports the fetal movement as active. Patient reports uterine contraction  activity as none. Patient reports  vaginal bleeding as none. Patient describes fluid per vagina as None.  Vitals:  Blood pressure 117/77, pulse 72, temperature 98 F (36.7 C), temperature source Oral, resp. rate 18, height 5' 6 (1.676 m), weight 117.9 kg, last menstrual period 05/07/2023, SpO2 99%. Vitals:   11/14/23 1920 11/14/23 2325 11/15/23 0501 11/15/23 0835  BP: 121/66 118/70 99/80 117/77  Pulse: 78 83 86 72  Resp: 16 17 18 18   Temp: 98 F (36.7 C) 98 F (36.7 C) 99 F (37.2 C) 98 F (36.7 C)  TempSrc: Oral Oral Oral Oral  SpO2: 100% 100% 99%   Weight:      Height:       Physical Examination:  General appearance - anxious and trying to sleep Fundal Height:   Pelvic Exam:   Cervical Exam:  and found to be /  and fetal presentation is  Extremities:  with DTRs  Membranes:  Fetal Monitoring:  refused EFM     Labs:  Results for orders placed or performed during the hospital encounter of 11/14/23 (from the past 24 hours)  Type and screen [facility]   Collection Time: 11/14/23  4:26 PM  Result Value Ref Range   ABO/RH(D) B POS    Antibody Screen NEG    Sample Expiration      11/17/2023,2359 Performed at [facility], [address] [place], [place] [address]   CBC   Collection Time: 11/14/23  4:30 PM  Result Value Ref Range   WBC 9.7 4.0 - 10.5 K/uL   RBC 4.14 3.87 - 5.11 MIL/uL   Hemoglobin 11.5 (L) 12.0 -  15.0 g/dL   HCT 66.6 (L) 63.9 - 53.9 %   MCV 80.4 80.0 - 100.0 fL   MCH 27.8 26.0 - 34.0 pg   MCHC 34.5 30.0 - 36.0 g/dL   RDW 86.7 88.4 - 84.4 %   Platelets 205 150 - 400 K/uL   nRBC 0.0 0.0 - 0.2 %  Rapid HIV screen (HIV 1/2 Ab+Ag)   Collection Time: 11/14/23  4:30 PM  Result Value Ref Range   HIV-1 P24 Antigen - HIV24 NON REACTIVE NON REACTIVE   HIV 1/2 Antibodies NON REACTIVE NON REACTIVE   Interpretation (HIV Ag Ab)      A non reactive test result means that HIV 1 or HIV 2 antibodies and HIV 1 p24 antigen were not detected in the specimen.  Comprehensive metabolic panel with GFR   Collection Time: 11/14/23  4:30 PM  Result Value Ref Range   Sodium 135 135 - 145 mmol/L   Potassium 3.7 3.5 - 5.1 mmol/L   Chloride 105 98 - 111 mmol/L   CO2 19 (L) 22 - 32 mmol/L   Glucose, Bld 84 70 - 99 mg/dL   BUN 6 6 - 20 mg/dL   Creatinine, Ser 9.42 0.44 - 1.00 mg/dL   Calcium  8.6 (L) 8.9 - 10.3  mg/dL   Total Protein 6.6 6.5 - 8.1 g/dL   Albumin 2.8 (L) 3.5 - 5.0 g/dL   AST 18 15 - 41 U/L   ALT 14 0 - 44 U/L   Alkaline Phosphatase 58 38 - 126 U/L   Total Bilirubin 0.3 0.0 - 1.2 mg/dL   GFR, Estimated >39 >39 mL/min   Anion gap 11 5 - 15    Imaging Studies:    US  MFM OB FOLLOW UP Result Date: 11/13/2023 ----------------------------------------------------------------------  OBSTETRICS REPORT                       (Signed Final 11/13/2023 01:53 pm) ---------------------------------------------------------------------- Patient Info  ID #:       [id number]                          D.O.B.:  [birth year] (23 yrs)(F)  Name:       Michelle Stephens [facility]              Visit Date: 11/13/2023 11:44 am ---------------------------------------------------------------------- Performed By  Attending:        Steffan Keys MD         Ref. Address:     [address]                                                             [place], [place]                                                             [address]  Performed By:      Comer Harrow       [facility]  Referred By:      [facility] ---------------------------------------------------------------------- Orders  #  Description  Code        Ordered By  1  US  MFM OB FOLLOW UP                   [id number]    RAVI [facility] ----------------------------------------------------------------------  #  Order #                     Accession #                Episode #  1  [id number]                   [id number]                 [id number] ---------------------------------------------------------------------- Indications  2 vessel umbilical cord                        O69.89X0  Obesity complicating pregnancy, second         O99.212  trimester (BMI 39.4)  Drug use complicating pregnancy, second        N00.677  trimester (Opioid use)  Poor obstetric history-Recurrent (habitual)    O26.20  abortion (3 consecutive ab's)  Anxiety during pregnancy, second trimester     O99.342, F41.9  Syphilis complicating pregnancy, second        O98.112  trimester  Antenatal follow-up for nonvisualized fetal    Z36.2  anatomy  [redacted] weeks gestation of pregnancy                Z3A.25  Low Risk NIPS ---------------------------------------------------------------------- Vital Signs  BP:          130/63 ---------------------------------------------------------------------- Fetal Evaluation  Num Of Fetuses:         1  Fetal Heart Rate(bpm):  125  Cardiac Activity:       Observed  Presentation:           Cephalic  Placenta:               Posterior  P. Cord Insertion:      Previously seen  Amniotic Fluid  AFI FV:      Within normal limits                              Largest Pocket(cm)                              3.84  ---------------------------------------------------------------------- Biometry  BPD:      62.6  mm     G. Age:  25w 3d         45  %    CI:        76.35   %    70 - 86                                                          FL/HC:      20.4   %    18.7 - 20.3  HC:       227   mm     G. Age:  24w 5d         13  %    HC/AC:  1.15        1.04 - 1.22  AC:      197.1  mm     G. Age:  24w 3d         16  %    FL/BPD:     74.0   %    71 - 87  FL:       46.3  mm     G. Age:  25w 3d         39  %    FL/AC:      23.5   %    20 - 24  HUM:      40.5  mm     G. Age:  24w 4d         27  %  Est. FW:     740  gm    1 lb 10 oz      22  % ---------------------------------------------------------------------- OB History  Blood Type:   B+  Gravidity:    4          SAB:   3  Living:       0 ---------------------------------------------------------------------- Gestational Age  LMP:           27w 1d        Date:  05/07/23                  EDD:   02/11/24  U/S Today:     25w 0d                                        EDD:   02/26/24  Best:          25w 2d     Det. By:  U/S  (10/10/23)          EDD:   02/24/24 ---------------------------------------------------------------------- Targeted Anatomy  Central Nervous System  Calvarium/Cranial V.:  Appears normal         Cereb./Vermis:          Previously seen  Cavum:                 Previously seen        Sales executive:         Previously seen  Lateral Ventricles:    Previously seen        Midline Falx:           Previously seen  Choroid Plexus:        Previously seen  Spine  Cervical:              Appears normal         Sacral:                 Appears normal  Thoracic:              Appears normal         Shape/Curvature:        Appears normal  Lumbar:                Appears normal  Head/Neck  Lips:                  Previously seen        Profile:                Previously seen  Neck:  Appears normal         Orbits/Eyes:            Previously seen  Nuchal Fold:            Previously seen        Mandible:               Previously seen  Nasal Bone:            Previously seen        Maxilla:                Previously seen  Thorax  4 Chamber View:        Not well visualized    Interventr. Septum:     Not well visualized  Cardiac Rhythm:        Normal                 Cardiac Axis:           Not well visualized  Cardiac Situs:         Not well visualized    Diaphragm:              Not well visualized  Rt Outflow Tract:      Not well visualized    3 Vessel View:          Not well visualized  Lt Outflow Tract:      Not well visualized    3 V Trachea View:       Not well visualized  Aortic Arch:           Previously seen        IVC:                    Previously seen  Ductal Arch:           Previously seen        Crossing:               Not well visualized  SVC:                   Previously seen  Abdomen  Ventral Wall:          Previously seen        Lt Kidney:              Appears normal  Cord Insertion:        Previously seen        Rt Kidney:              Appears normal  Situs:                 Previously seen        Bladder:                Appears normal  Stomach:               Appears normal  Extremities  Lt Humerus:            Previously seen        Lt Femur:               Previously seen  Rt Humerus:            Previously seen        Rt Femur:               Previously seen  Lt Forearm:  Previously seen        Lt Lower Leg:           Previously seen  Rt Forearm:            Previously seen        Rt Lower Leg:           Previously seen  Lt Hand:               Open hand nml          Lt Foot:                Nml heel/foot  Rt Hand:               Open hand nml          Rt Foot:                Nml heel/foot  Other  Umbilical Cord:        Single UA              Genitalia:              Female prev seen  Comment:     Technically difficult due to maternal habitus and fetal position. ---------------------------------------------------------------------- Cervix Uterus Adnexa  Cervix  Not  visualized (advanced GA >24wks) ---------------------------------------------------------------------- Comments  Marifer Hollingshead is currently at 25 weeks and 2 days.  She  has been followed due to maternal obesity with a BMI of 39.8,  maternal treatment with Subutex , and a fetus with a two-  vessel umbilical cord.  Her EDC was changed to February 24, 2024 following her  last ultrasound exam.  Sonographic findings  Single intrauterine pregnancy at 25w 2d.  Fetal cardiac activity:  Observed and appears normal.  Presentation: Cephalic.  Interval fetal anatomy appears normal.  Using an [facility] of February 24, 2024, the estimated fetal  weight of 1 pound 10 ounces measures at the 22nd  percentile.  Amniotic fluid volume: Within normal limits. MVP: 3.84 cm.  Placenta: Posterior.  The two-vessel umbilical cord continues to be noted on  today's exam.  An intracardiac echogenic focus was noted in the left  ventricle of the fetal heart.  The small association between an echogenic focus and  Down syndrome was discussed.  Due to the echogenic focus noted today, the patient was  offered and declined an amniocentesis today for definitive  diagnosis of fetal aneuploidy.  She reports that she is  comfortable with her negative cell free DNA test.  There are limitations of prenatal ultrasound such as the  inability to detect certain abnormalities due to poor  visualization. Various factors such as fetal position,  gestational age and maternal body habitus may increase the  difficulty in visualizing the fetal anatomy.  Due to the association of congenital heart defects with a two-  vessel cord, the patient was referred to [facility] pediatric  cardiology for a fetal echocardiogram.  Due to maternal obesity, the two-vessel cord, and treatment  with Subutex , we will start weekly fetal testing at 34 weeks.  She will return in 4 weeks for another growth scan.  The patient stated that all of her questions were answered  today.  A total of 20  minutes was spent counseling and coordinating  the care for this patient.  Greater than 50% of the time was  spent in direct face-to-face contact. ----------------------------------------------------------------------  Steffan Keys, MD Electronically Signed Final Report   11/13/2023 01:53 pm ----------------------------------------------------------------------     Medications:  Scheduled  buprenorphine -naloxone   1 tablet Sublingual TID   docusate sodium   100 mg Oral Daily   pantoprazole  (PROTONIX ) IV  40 mg Intravenous Q24H   prenatal multivitamin  1 tablet Oral Q1200   I have reviewed the patient's current medications.  ASSESSMENT: G4P0030 [redacted]w[redacted]d Estimated Date of Delivery: 02/24/24  Patient Active Problem List   Diagnosis Date Noted   Housing insecurity 10/07/2023   Rubella non-immune status, antepartum 09/24/2023   Supervision of high risk pregnancy, antepartum 09/23/2023   Opioid use disorder, severe, dependence (HCC) 09/23/2023   Substance abuse affecting pregnancy, antepartum (HCC) 09/23/2023   History of syphilis 09/23/2023   Self-inflicted injury    PTSD (post-traumatic stress disorder) 02/04/2013   ADHD (attention deficit hyperactivity disorder), combined type 02/04/2013   MDD (major depressive disorder), recurrent episode, severe 11/07/2011    Class: Diagnosis of    PLAN: >since patient had emesis x 2 and for sure threw 1 of the 16-2 up, and her symtoms are not escalating I will order the 8-2 suboxone  3 times daily which takes into acct her emesis  >if her symptoms are uncontrolled with that then adjustments can be made as an outpatient >anticipate home tomorrow  Vonn VEAR Inch 11/15/2023,11:16 AM

## 2023-11-15 NOTE — Plan of Care (Signed)

## 2023-11-15 NOTE — Progress Notes (Signed)
 Pt continues to refuse fetal monitoring. Dr. Izell aware.

## 2023-11-15 NOTE — Progress Notes (Signed)
 Attempting to place patient on EFM due to elevated COWS score; pt moving back and forth and turning from side to side stating that she can not have the monitor on her body. Pt states that she does not want the monitor placed on her and that she understood the risk that this nurse discussed with her about her not allowing her baby being placed on the EFM in her current state. Pt became aggravated and stated I can still feel my baby moving so I know she is ok. This nurse is respecting pt's wishes.

## 2023-11-15 NOTE — Progress Notes (Signed)
 CSW was consulted due to resources for living and transportation needs. CSW attempted to meet with patient to complete assessment and provide support; however, when CSW entered room and introduced self, patient requested CSW return at a later time. CSW to follow up.   Signed,  Sharyne LOIS Roulette, MSW, LCSWA, LCASA 11/15/2023 12:10 PM

## 2023-11-16 ENCOUNTER — Other Ambulatory Visit (HOSPITAL_COMMUNITY): Payer: Self-pay

## 2023-11-16 DIAGNOSIS — F1119 Opioid abuse with unspecified opioid-induced disorder: Secondary | ICD-10-CM | POA: Diagnosis present

## 2023-11-16 LAB — GLUCOSE, RANDOM: Glucose, Bld: 90 mg/dL (ref 70–99)

## 2023-11-16 LAB — GLUCOSE, 1 HOUR GESTATIONAL: Glucose Tolerance, 1 hour: 144 mg/dL

## 2023-11-16 MED ORDER — BUPRENORPHINE HCL-NALOXONE HCL 8-2 MG SL SUBL
1.0000 | SUBLINGUAL_TABLET | Freq: Three times a day (TID) | SUBLINGUAL | 0 refills | Status: DC
  Filled 2023-11-16: qty 42, 14d supply, fill #0

## 2023-11-16 MED ORDER — ZOLPIDEM TARTRATE 5 MG PO TABS
5.0000 mg | ORAL_TABLET | Freq: Every evening | ORAL | 0 refills | Status: DC | PRN
  Filled 2023-11-16: qty 14, 14d supply, fill #0

## 2023-11-16 MED ORDER — HYDROXYZINE HCL 25 MG PO TABS
25.0000 mg | ORAL_TABLET | Freq: Four times a day (QID) | ORAL | 0 refills | Status: DC | PRN
  Filled 2023-11-16: qty 30, 8d supply, fill #0

## 2023-11-16 NOTE — Discharge Summary (Signed)
 Physician Discharge Summary  Patient ID: Michelle Stephens MRN: [id number] DOB/AGE: 19-Dec-2000 23 y.o.  Admit date: 11/14/2023 Discharge date: 11/16/2023  Admission Diagnoses: [redacted]w[redacted]d with OAD Suboxone  therapy initiation  Discharge Diagnoses:  Principal Problem:   Opioid use disorder, severe, dependence (HCC) Active Problems:   Supervision of high risk pregnancy, antepartum   Housing insecurity   Positive RPR test   Opioid abuse with opioid-induced disorder (HCC)   Discharged Condition: stable  Hospital Course:  Pt was admitted within 24 hjours of last opioid use Withdrawal symtoms allowed to begin and suboxone  was titrated to appropriate effect Therapy is 8-2 3 times daily Also discharged on vistaril  and ambien  prn  Consults:   Significant Diagnostic Studies:   Treatments: suboxone  therapy initiation  Discharge Exam: Blood pressure 114/68, pulse 85, temperature 98.5 F (36.9 C), resp. rate 18, height 5' 6 (1.676 m), weight 117.9 kg, last menstrual period 05/07/2023, SpO2 100%. General appearance: alert, cooperative, and no distress GI: soft, non-tender; bowel sounds normal; no masses,  no organomegaly  Disposition: Discharge disposition: 01-Home or Self Care       Discharge Instructions     Diet - low sodium heart healthy   Complete by: As directed    Increase activity slowly   Complete by: As directed       Allergies as of 11/16/2023       Reactions   Coconut Flavoring Agent (non-screening) Hives        Medication List     TAKE these medications    acetaminophen  500 MG tablet Commonly known as: TYLENOL  Take 1,000 mg by mouth every 6 (six) hours as needed for mild pain, moderate pain or headache.   buprenorphine -naloxone  8-2 mg Subl SL tablet Commonly known as: SUBOXONE  Place 1 tablet under the tongue 3 (three) times daily.   calcium  carbonate 500 MG chewable tablet Commonly known as: TUMS - dosed in mg elemental calcium  Chew 1 tablet by  mouth as needed for indigestion or heartburn.   dicyclomine  10 MG capsule Commonly known as: BENTYL  Take 1 capsule (10 mg total) by mouth 3 (three) times daily as needed (abdominal spasms).   famotidine  20 MG tablet Commonly known as: Pepcid  Take 1 tablet (20 mg total) by mouth 2 (two) times daily.   hydrOXYzine  25 MG tablet Commonly known as: ATARAX  Take 1 tablet (25 mg total) by mouth every 6 (six) hours as needed for anxiety.   multivitamin-prenatal 27-0.8 MG Tabs tablet Take 1 tablet by mouth daily at 12 noon.   naloxone  4 MG/0.1ML Liqd nasal spray kit Commonly known as: NARCAN  Use as needed to reverse opioid overdose   ondansetron  4 MG disintegrating tablet Commonly known as: ZOFRAN -ODT Take 1-2 tablets (4-8 mg total) by mouth every 8 (eight) hours as needed for nausea.   senna 8.6 MG Tabs tablet Commonly known as: SENOKOT Take 2 tablets (17.2 mg total) by mouth daily as needed for mild constipation.   zolpidem  5 MG tablet Commonly known as: AMBIEN  Take 1 tablet (5 mg total) by mouth at bedtime as needed for sleep.        Follow-up Information     Lola Donnice HERO, MD Follow up on 11/28/2023.   Specialty: Family Medicine Contact information: [address] [place] [address] [phone]                 Signed: Vonn VEAR Inch 11/16/2023, 10:14 AM

## 2023-11-16 NOTE — Plan of Care (Signed)

## 2023-11-16 NOTE — Clinical SW OB High Risk (Signed)
 OB Specialty Care  Clinical Social Worker:  Michelle Stephens Date/Time:  11/16/2023, 1:54 PM Gestational Age on Admission:  23 y.o. Admitting Diagnosis:  Management of opioid use disorder  Expected Delivery Date:  02/24/24  [facility] Environment  Home Address:  [employer] [address] [place], [place] [address]  Psychosocial Data  Employment:  Unemployed  Type of Work:    Education: 9 to 11 years (10th grade)   Cultural/Environment Issues Impacting Care:     Strengths/Weaknesses/Factors to Consider  Concerns Related to Hospitalization: Patient expressed continued motivation to move forward with MAT to treat OUD during pregnancy. Pt reports financial stress, housing insecurity, and transportation barriers.  Previous Pregnancies/Feelings Towards Pregnancy?  Concerns related to being/becoming a mother?:  Pt states she is feeling excited about her pregnancy. Pt shares she is expecting a girl, Michelle Stephens, but expresses concern about housing insecurity and limited resources.  Social Support (FOB? Who is/will be helping with baby/other kids?): Pt reports FOB is not involved. [name] identified her brother and best friend as her main supports.   Recent Stressful Life Events (life changes in past year?): [name] is experiencing housing instability.   Prenatal Care/Education/Home Preparations: Patient is receiving prenatal care through [facility]. Pt states her best friend is planning to purchase a car seat and bassinet for infant. Patient is in need of diapers and wipes.   Domestic Violence (of any type):  No  Substance Use During Pregnancy: Yes. Patient was admitted for initiation of Suboxone  Medication Assisted Treatment for management of Opioid Use Disorder. [employer] UDS 11/11/23 positive for THC, Cocaine , and Fentanyl. Patient states she is connected with [facility] resource and has access to Narcan . Patient declined additional substance use treatment resources during CSW  consult.    Clinical Assessment/Plan: CSW received consult due to housing insecurity, transportation barriers, substance use during pregnancy, and mental health support. CSW met with patient to complete assessment and offer support. When CSW entered room, patient was observed laying in hospital bed. CSW introduced self and explained reason for visit. Patient presented as calm, was agreeable to CSW visit, and remained engaged during encounter.   CSW inquired how patient is feeling. Patient reports feeling okay, stating she is feeling better than when she was initially admitted but is still experiencing body aches. CSW provided encouraging words about patient's initiation of MAT.   CSW inquired about patient's living situation. Patient reports she is currently living at a hotel ([employer]) alone. Patient states she was recently provided 1 week of rental assistance but is having ongoing difficulty paying for housing. CSW inquired about additional resource needs. Patient reports food insecurity. Patient states she receives [facility] and [employer]. Patient was accepting of additional food bank resources, which CSW provided. Patient also reports transportation barriers, stating she relies on [place], and her friends or brother for transportation. Patient states she has been in touch with a case worker through her insurance for transportation assistance and was told she was being sent a $200 Lyft vouchers but has not yet received it. CSW explained that patient should also be able to use [employer] for transportation assistance through her insurance and provided contact information/instructions.   CSW inquired about patient's mental health history. Patient reports diagnoses of PTSD, Schizoaffective Disorder, and Bipolar Disorder (unknown type), stating she typically experiences depressive episodes. Patient reports she was diagnosed with PTSD, Schizoaffective Disorder, and Bipolar Disorder around age 16 or 1 after being  hospitalized at [facility]. CSW inquired about  auditory and/or visual hallucinations. [name] reports a history of both auditory and visual hallucinations, stating the last time she experienced AVH was about 2 weeks ago when she saw and heard a white cat that she had rehomed walk through her hotel room. Patient states the AVH was not distressing to her and denies a history of command hallucinations. CSW assessed for safety. Patient denied current SI/HI/domestic violence. CSW inquired about current mental health treatment. Patient states she has been seeing integrated behavioral health therapist, Warren Mering through [facility], which she has found helpful. Patient states she is not prescribed mental health medication but expressed interest in starting medication for management of mental health symptoms. Patient states her mom experienced bad posptartum and she expressed concern about her mental health following delivery. CSW provided patient with crisis and outpatient mental health resources and encourages her to follow up. Patient also has a follow up appointmnet with [facility] 12/02/23.  CSW inquired about additional professional supports. Patient states she is connected with [facility] and feels supported by [facility] clinic. CSW provided patient with additional housing resources including shelters and rental assistance resources. CSW also provided information about [employer] for baby essentials.   CSW explained hospital policy for drug testing once infant is born. Patient expressed understanding and denied having questions about hospital drug screen policy.  Patient declined additional needs at this time and states her brother can pick her up from the hospital when she is discharged.   Signed,  Michelle LOIS Roulette, MSW, LCSWA, LCASA 11/16/2023 3:28 PM

## 2023-11-16 NOTE — Progress Notes (Signed)
 Patient allowed this nurse to place her on the [facility] for daily NST, but set up in the bed and began to remove the monitors from herself at 2158 stating that she can not stand to have the monitors pushing on her skin. This nurse asked the patient to please stay on the monitor a little longer due to tracing that what produced and visible on the monitor and was warranting further evaluation; but patient stated that her baby is fine and that this nurse has seen all that I need to see while throwing monitors and straps to hold monitors in the floor and pulling the covers over her head and turning her body away from this nurse. This nurse proceeded to educate patient further on why the need for further monitoring was necessary and patient started snoring very loudly over this nurses attempt to speak. This nurse will be respecting patients wishes.

## 2023-11-17 ENCOUNTER — Encounter: Payer: Self-pay | Admitting: Family Medicine

## 2023-11-18 LAB — T.PALLIDUM AB, TOTAL: T Pallidum Abs: REACTIVE — AB

## 2023-11-23 LAB — URINE DRUGS OF ABUSE SCREEN W ALC, ROUTINE (REF LAB)
Amphetamines, Urine: NEGATIVE ng/mL
Barbiturate, Ur: NEGATIVE ng/mL
Benzodiazepine Quant, Ur: NEGATIVE ng/mL
Creatinine, Urine: 93.3 mg/dL (ref 20.0–300.0)
Ethanol U, Quan: NEGATIVE %
Methadone Screen, Urine: NEGATIVE ng/mL
Nitrite Urine, Quantitative: NEGATIVE ug/mL
OPIATE SCREEN URINE: NEGATIVE ng/mL
Phencyclidine, Ur: NEGATIVE ng/mL
Propoxyphene, Urine: NEGATIVE ng/mL
pH, Urine: 6.9 (ref 4.5–8.9)

## 2023-11-23 LAB — PANEL 799049
CARBOXY THC GC/MS CONF: 90 ng/mL
Cannabinoid GC/MS, Ur: POSITIVE — AB

## 2023-11-23 LAB — COCAINE CONF, UR
Benzoylecgonine GC/MS Conf: 475 ng/mL
Cocaine Metab Quant, Ur: POSITIVE — AB

## 2023-11-25 ENCOUNTER — Other Ambulatory Visit: Payer: MEDICAID

## 2023-11-26 ENCOUNTER — Other Ambulatory Visit: Payer: Self-pay

## 2023-11-26 ENCOUNTER — Telehealth: Payer: Self-pay | Admitting: Family Medicine

## 2023-11-26 DIAGNOSIS — O0990 Supervision of high risk pregnancy, unspecified, unspecified trimester: Secondary | ICD-10-CM

## 2023-11-26 NOTE — Telephone Encounter (Signed)
 Please disregard this error

## 2023-11-28 ENCOUNTER — Other Ambulatory Visit: Payer: MEDICAID

## 2023-11-28 ENCOUNTER — Encounter: Payer: MEDICAID | Admitting: Family Medicine

## 2023-11-28 ENCOUNTER — Encounter: Payer: Self-pay | Admitting: Family Medicine

## 2023-11-28 ENCOUNTER — Telehealth: Payer: Self-pay | Admitting: Family Medicine

## 2023-11-28 ENCOUNTER — Other Ambulatory Visit: Payer: Self-pay

## 2023-11-28 DIAGNOSIS — O0990 Supervision of high risk pregnancy, unspecified, unspecified trimester: Secondary | ICD-10-CM

## 2023-11-28 DIAGNOSIS — Z3A19 19 weeks gestation of pregnancy: Secondary | ICD-10-CM

## 2023-11-28 DIAGNOSIS — F1120 Opioid dependence, uncomplicated: Secondary | ICD-10-CM

## 2023-11-28 MED ORDER — BUPRENORPHINE HCL-NALOXONE HCL 8-2 MG SL SUBL: 1.0000 | SUBLINGUAL_TABLET | Freq: Three times a day (TID) | SUBLINGUAL | 0 refills | Status: DC

## 2023-11-28 NOTE — Progress Notes (Signed)
 Here for 2hr GTT Refill sent for suboxone  UDS collected Forms signed for [employer]

## 2023-11-28 NOTE — Telephone Encounter (Signed)
 Hey Michelle Stephens was here this morning for her gtt she was supposed to see you but we had to reschedule her visit. She is concerned that she is going to run out of medicine over the weekend. She left a urine sample this morning. Will you be able to send her something pls

## 2023-12-01 ENCOUNTER — Encounter: Payer: Self-pay | Admitting: Advanced Practice Midwife

## 2023-12-01 ENCOUNTER — Ambulatory Visit (INDEPENDENT_AMBULATORY_CARE_PROVIDER_SITE_OTHER): Payer: MEDICAID | Admitting: Advanced Practice Midwife

## 2023-12-01 ENCOUNTER — Other Ambulatory Visit: Payer: Self-pay

## 2023-12-01 VITALS — BP 110/74 | HR 97 | Wt 265.5 lb

## 2023-12-01 DIAGNOSIS — O99322 Drug use complicating pregnancy, second trimester: Secondary | ICD-10-CM

## 2023-12-01 DIAGNOSIS — F902 Attention-deficit hyperactivity disorder, combined type: Secondary | ICD-10-CM

## 2023-12-01 DIAGNOSIS — Z23 Encounter for immunization: Secondary | ICD-10-CM | POA: Diagnosis not present

## 2023-12-01 DIAGNOSIS — Z3A27 27 weeks gestation of pregnancy: Secondary | ICD-10-CM

## 2023-12-01 DIAGNOSIS — F1120 Opioid dependence, uncomplicated: Secondary | ICD-10-CM

## 2023-12-01 DIAGNOSIS — O0992 Supervision of high risk pregnancy, unspecified, second trimester: Secondary | ICD-10-CM | POA: Diagnosis not present

## 2023-12-01 DIAGNOSIS — F5105 Insomnia due to other mental disorder: Secondary | ICD-10-CM

## 2023-12-01 DIAGNOSIS — O09892 Supervision of other high risk pregnancies, second trimester: Secondary | ICD-10-CM

## 2023-12-01 DIAGNOSIS — F99 Mental disorder, not otherwise specified: Secondary | ICD-10-CM

## 2023-12-01 DIAGNOSIS — Z8619 Personal history of other infectious and parasitic diseases: Secondary | ICD-10-CM | POA: Diagnosis not present

## 2023-12-01 DIAGNOSIS — F4310 Post-traumatic stress disorder, unspecified: Secondary | ICD-10-CM | POA: Diagnosis not present

## 2023-12-01 DIAGNOSIS — O0990 Supervision of high risk pregnancy, unspecified, unspecified trimester: Secondary | ICD-10-CM

## 2023-12-01 DIAGNOSIS — O09899 Supervision of other high risk pregnancies, unspecified trimester: Secondary | ICD-10-CM | POA: Insufficient documentation

## 2023-12-01 MED ORDER — ZOLPIDEM TARTRATE 5 MG PO TABS: 5.0000 mg | ORAL_TABLET | Freq: Every evening | ORAL | 0 refills | Status: DC | PRN

## 2023-12-01 MED ORDER — BUPRENORPHINE HCL-NALOXONE HCL 8-2 MG SL FILM: 1.0000 | ORAL_FILM | Freq: Four times a day (QID) | SUBLINGUAL | 0 refills | Status: DC

## 2023-12-01 NOTE — Progress Notes (Signed)
 / Subjective:  Michelle Stephens is a 23 y.o. G4P0030 at [redacted]w[redacted]d being seen today for ongoing prenatal care.  She is currently monitored for the following issues for this high-risk pregnancy and has MDD (major depressive disorder), recurrent episode, severe; PTSD (post-traumatic stress disorder); ADHD (attention deficit hyperactivity disorder), combined type; Self-inflicted injury; Supervision of high risk pregnancy, antepartum; Opioid use disorder, severe, dependence (HCC); Substance abuse affecting pregnancy, antepartum (HCC); History of syphilis; Rubella non-immune status, antepartum; Housing insecurity; Positive RPR test; Opioid abuse with opioid-induced disorder (HCC); JIA (juvenile idiopathic arthritis), oligoarthritis, persistent (HCC); Single umbilical artery affecting management of mother in singleton pregnancy, antepartum; and Suboxone  maintenance treatment complicating pregnancy, antepartum (HCC) on their problem list.  Patient reports significant insomnia. Has been on Ambien  long-term. Requests refill. Brief discussion of lack of data showing safety in pregnancy and importance of good sleep hygiene. .  Contractions: Not present. Vag. Bleeding: None.  Movement: Present. Denies leaking of fluid.   Suboxone  induction 11/16/23. 8-2 TID. Pt denies experiencing withdrawal or cravings on current dose. Prescribed 30 days supply but only 5 days dispensed on 11/29/23 per PDMP due to pharmacy supply. Called and they will have remaining supply for pt.   The following portions of the patient's history were reviewed and updated as appropriate: allergies, current medications, past family history, past medical history, past social history, past surgical history and problem list. Problem list updated.  Objective:   Vitals:   12/01/23 1337  BP: 110/74  Pulse: 97  Weight: 265 lb 8 oz (120.4 kg)    Fetal Status: Fetal Heart Rate (bpm): 158   Movement: Present     General:  Alert, oriented and cooperative.  Patient is in no acute distress.  Skin: Skin is warm and dry. No rash noted.   Cardiovascular: Normal heart rate noted  Respiratory: Normal respiratory effort, no problems with respiration noted  Abdomen: Soft, gravid, appropriate for gestational age. Pain/Pressure: Absent     Pelvic: Vag. Bleeding: None     Cervical exam deferred        Extremities: Normal range of motion.  Edema: Trace (hands)  Mental Status: Normal mood and affect. Normal behavior. Normal judgment and thought content.   Urinalysis:      PDMP reviewed during this encounter.   Last UDS: Lab Results  Component Value Date   CREATIUR 145 10/14/2023     Assessment and Plan:  Pregnancy: G4P0030 at 101w6d  1. PTSD (post-traumatic stress disorder) (Primary)  2. ADHD (attention deficit hyperactivity disorder), combined type  3. Supervision of high risk pregnancy, antepartum  4. History of syphilis - S/P Bicillin x 3 - Check titers Nov, Mau  5. [redacted] weeks gestation of pregnancy - Tdap vaccine greater than or equal to 7yo IM  6. Single umbilical artery affecting management of mother in singleton pregnancy, antepartum - Antenatal testing per MFM  7. Opioid use disorder, severe, dependence (HCC)  8. Suboxone  maintenance treatment complicating pregnancy, antepartum, second trimester (HCC) - Buprenorphine  HCl-Naloxone  HCl (SUBOXONE ) 8-2 MG FILM; Place 1 Film under the tongue 4 (four) times daily.  Dispense: 120 Film; Refill: 0  9. Need for diphtheria-tetanus-pertussis (Tdap) vaccine - Tdap vaccine greater than or equal to 7yo IM  10. Insomnia due to other mental disorder - zolpidem  (AMBIEN ) 5 MG tablet; Take 1 tablet (5 mg total) by mouth at bedtime as needed for sleep.  Dispense: 30 tablet; Refill: 0   Preterm labor symptoms and general obstetric precautions including but not limited to  vaginal bleeding, contractions, leaking of fluid and fetal movement were reviewed in detail with the patient. Please refer to  After Visit Summary for other counseling recommendations.   Return in about 1 week (around 12/08/2023) for REACH ROB.   Future Appointments  Date Time Provider Department Center  12/02/2023  3:45 PM [facility] HEALTH CLINICIAN [facility]  12/24/2023  1:15 PM [facility] PROVIDER 1 [facility]  12/24/2023  1:30 PM [facility] US2 [facility]    Total face-to-face time with patient: 20 minutes.  Over 50% of encounter was spent on counseling and coordination of care.   Pearl Bents  Claudene HOWARD 12/01/2023 2:44 PM Center for [employer]

## 2023-12-01 NOTE — Patient Instructions (Addendum)
[email or web address]     TDaP Vaccine Pregnancy Get the Whooping Cough Vaccine While You Are Pregnant (CDC)  It is important for women to get the whooping cough vaccine in the third trimester of each pregnancy. Vaccines are the best way to prevent this disease. There are 2 different whooping cough vaccines. Both vaccines combine protection against whooping cough, tetanus and diphtheria, but they are for different age groups: Tdap: for everyone 11 years or older, including pregnant women  DTaP: for children 2 months through 6 years of age  You need the whooping cough vaccine during each of your pregnancies The recommended time to get the shot is during your 27th through 36th week of pregnancy, preferably during the earlier part of this time period. The Centers for Disease Control and Prevention (CDC) recommends that pregnant women receive the whooping cough vaccine for adolescents and adults (called Tdap vaccine) during the third trimester of each pregnancy. The recommended time to get the shot is during your 27th through 36th week of pregnancy, preferably during the earlier part of this time period. This replaces the original recommendation that pregnant women get the vaccine only if they had not previously received it. The American College of Obstetricians and Gynecologists and the American College of Nurse-Midwives support this recommendation.  You should get the whooping cough vaccine while pregnant to pass protection to your baby frame support disabled and/or not supported in this browser  Learn why Michelle Stephens decided to get the whooping cough vaccine in her 3rd trimester of pregnancy and how her baby girl was born with some protection against the disease. Also available on YouTube. After receiving the whooping cough vaccine, your body will create protective antibodies (proteins produced by the body to fight off diseases) and pass some of them to your baby before birth. These antibodies  provide your baby some short-term protection against whooping cough in early life. These antibodies can also protect your baby from some of the more serious complications that come along with whooping cough. Your protective antibodies are at their highest about 2 weeks after getting the vaccine, but it takes time to pass them to your baby. So the preferred time to get the whooping cough vaccine is early in your third trimester. The amount of whooping cough antibodies in your body decreases over time. That is why CDC recommends you get a whooping cough vaccine during each pregnancy. Doing so allows each of your babies to get the greatest number of protective antibodies from you. This means each of your babies will get the best protection possible against this disease.  Getting the whooping cough vaccine while pregnant is better than getting the vaccine after you give birth Whooping cough vaccination during pregnancy is ideal so your baby will have short-term protection as soon as he is born. This early protection is important because your baby will not start getting his whooping cough vaccines until he is 2 months old. These first few months of life are when your baby is at greatest risk for catching whooping cough. This is also when he's at greatest risk for having severe, potentially life-threating complications from the infection. To avoid that gap in protection, it is best to get a whooping cough vaccine during pregnancy. You will then pass protection to your baby before he is born. To continue protecting your baby, he should get whooping cough vaccines starting at 2 months old. You may never have gotten the Tdap vaccine before and did not get it during this pregnancy. If   so, you should make sure to get the vaccine immediately after you give birth, before leaving the hospital or birthing center. It will take about 2 weeks before your body develops protection (antibodies) in response to the vaccine. Once you  have protection from the vaccine, you are less likely to give whooping cough to your newborn while caring for him. But remember, your baby will still be at risk for catching whooping cough from others. A recent study looked to see how effective Tdap was at preventing whooping cough in babies whose mothers got the vaccine while pregnant or in the hospital after giving birth. The study found that getting Tdap between 27 through 36 weeks of pregnancy is 85% more effective at preventing whooping cough in babies younger than 2 months old. Blood tests cannot tell if you need a whooping cough vaccine There are no blood tests that can tell you if you have enough antibodies in your body to protect yourself or your baby against whooping cough. Even if you have been sick with whooping cough in the past or previously received the vaccine, you still should get the vaccine during each pregnancy. Breastfeeding may pass some protective antibodies onto your baby By breastfeeding, you may pass some antibodies you have made in response to the vaccine to your baby. When you get a whooping cough vaccine during your pregnancy, you will have antibodies in your breast milk that you can share with your baby as soon as your milk comes in. However, your baby will not get protective antibodies immediately if you wait to get the whooping cough vaccine until after delivering your baby. This is because it takes about 2 weeks for your body to create antibodies. Learn more about the health benefits of breastfeeding.  

## 2023-12-02 ENCOUNTER — Ambulatory Visit (INDEPENDENT_AMBULATORY_CARE_PROVIDER_SITE_OTHER): Payer: MEDICAID | Admitting: Clinical

## 2023-12-02 ENCOUNTER — Ambulatory Visit: Payer: Self-pay | Admitting: Advanced Practice Midwife

## 2023-12-02 DIAGNOSIS — F4310 Post-traumatic stress disorder, unspecified: Secondary | ICD-10-CM

## 2023-12-02 DIAGNOSIS — Z658 Other specified problems related to psychosocial circumstances: Secondary | ICD-10-CM

## 2023-12-02 DIAGNOSIS — F332 Major depressive disorder, recurrent severe without psychotic features: Secondary | ICD-10-CM

## 2023-12-02 DIAGNOSIS — F902 Attention-deficit hyperactivity disorder, combined type: Secondary | ICD-10-CM

## 2023-12-02 DIAGNOSIS — F1120 Opioid dependence, uncomplicated: Secondary | ICD-10-CM

## 2023-12-02 LAB — TOXASSURE FLEX 15, UR
6-ACETYLMORPHINE IA: NEGATIVE ng/mL
7-aminoclonazepam: NOT DETECTED ng/mg{creat}
AMPHETAMINES IA: NEGATIVE ng/mL
Alpha-hydroxyalprazolam: NOT DETECTED ng/mg{creat}
Alpha-hydroxymidazolam: NOT DETECTED ng/mg{creat}
Alpha-hydroxytriazolam: NOT DETECTED ng/mg{creat}
Alprazolam: NOT DETECTED ng/mg{creat}
BARBITURATES IA: NEGATIVE ng/mL
BUPRENORPHINE: POSITIVE
Benzodiazepines: NEGATIVE
Buprenorphine: 55 ng/mg{creat}
Clonazepam: NOT DETECTED ng/mg{creat}
Creatinine: 158 mg/dL (ref >=20)
Desalkylflurazepam: NOT DETECTED ng/mg{creat}
Desmethyldiazepam: NOT DETECTED ng/mg{creat}
Desmethylflunitrazepam: NOT DETECTED ng/mg{creat}
Diazepam: NOT DETECTED ng/mg{creat}
ETHYL ALCOHOL Enzymatic: NEGATIVE g/dL
FENTANYL: POSITIVE
Fentanyl: NOT DETECTED ng/mg{creat}
Flunitrazepam: NOT DETECTED ng/mg{creat}
Lorazepam: NOT DETECTED ng/mg{creat}
METHADONE IA: NEGATIVE ng/mL
METHADONE MTB IA: NEGATIVE ng/mL
Midazolam: NOT DETECTED ng/mg{creat}
Norbuprenorphine: 518 ng/mg{creat}
Norfentanyl: 5 ng/mg{creat}
OPIATE CLASS IA: NEGATIVE ng/mL
OXYCODONE CLASS IA: NEGATIVE ng/mL
Oxazepam: NOT DETECTED ng/mg{creat}
PHENCYCLIDINE IA: NEGATIVE ng/mL
TAPENTADOL, IA: NEGATIVE ng/mL
TRAMADOL IA: NEGATIVE ng/mL
Temazepam: NOT DETECTED ng/mg{creat}

## 2023-12-02 LAB — AFP, SERUM, OPEN SPINA BIFIDA
AFP Value: 61.9 ng/mL
Gest. Age on Collection Date: 28.4 wk
Maternal Age At EDD: 24.6 a
Weight: 239 [lb_av]

## 2023-12-02 LAB — CBC
Hematocrit: 35.5 % (ref 34.0–46.6)
Hemoglobin: 11.6 g/dL (ref 11.1–15.9)
MCH: 28 pg (ref 26.6–33.0)
MCHC: 32.7 g/dL (ref 31.5–35.7)
MCV: 86 fL (ref 79–97)
Platelets: 223 x10E3/uL (ref 150–450)
RBC: 4.14 x10E6/uL (ref 3.77–5.28)
RDW: 13.5 % (ref 11.7–15.4)
WBC: 12.4 x10E3/uL — ABNORMAL HIGH (ref 3.4–10.8)

## 2023-12-02 LAB — RPR, QUANT+TP ABS (REFLEX)
Rapid Plasma Reagin, Quant: 1:1 {titer} — ABNORMAL HIGH
T Pallidum Abs: REACTIVE — AB

## 2023-12-02 LAB — CANNABINOIDS, MS, UR RFX
Cannabinoids Confirmation: POSITIVE
Carboxy-THC: 455 ng/mg{creat}

## 2023-12-02 LAB — GLUCOSE TOLERANCE, 2 HOURS W/ 1HR
Glucose, 1 hour: 103 mg/dL (ref 70–179)
Glucose, 2 hour: 99 mg/dL (ref 70–152)
Glucose, Fasting: 89 mg/dL (ref 70–91)

## 2023-12-02 LAB — COCAINE AND MTB, MS, UR RFX
Benzoylecgonine: >3165 ng/mg{creat}
Cocaethylene: NOT DETECTED ng/mg{creat}
Cocaine Confirmation: POSITIVE
Cocaine: >3165 ng/mg{creat}

## 2023-12-02 LAB — RPR: RPR Ser Ql: REACTIVE — AB

## 2023-12-02 LAB — HIV ANTIBODY (ROUTINE TESTING W REFLEX): HIV Screen 4th Generation wRfx: NONREACTIVE

## 2023-12-02 NOTE — BH Specialist Note (Unsigned)
 Integrated Behavioral Health via Telemedicine Visit  12/03/2023 [name] [id number]  Number of Integrated Behavioral Health Clinician visits: 4- Fourth Visit  Session Start time: 1547   Session End time: 1627  Total time in minutes: 40  Referring Provider: Euna Sharps, CNM Patient/Family location: Home [facility] Provider location: Center for [facility]  All persons participating in visit: [name] and [name]   Types of Service: Individual psychotherapy and Video visit  I connected with Michelle Stephens and/or Michelle Stephens via  Telephone or Engineer, civil (consulting)  (Video is Caregility application) and verified that I am speaking with the correct person using two identifiers. Discussed confidentiality: Yes   I discussed the limitations of telemedicine and the availability of in person appointments.  Discussed there is a possibility of technology failure and discussed alternative modes of communication if that failure occurs.  I discussed that engaging in this telemedicine visit, they consent to the provision of behavioral healthcare and the services will be billed under their insurance.  [name] expressed understanding and consented to Telemedicine visit: Yes   Presenting Concerns: Patient and/or family reports the following symptoms/concerns: Continued life stress (waiting on new housing with sister); continued sleep difficulty (sleeping two hours on Ambien , then wakes up); continued anxious thoughts and worry (all of the what-ifs, partially attributed to brother losing two children to accidents, one in house fire), poor concentration due to ADHD; stress over Silo(cat) running off. Pt wants to know if it's safe to go on haunted trail in pregnancy.  Duration of problem: Current pregnancy increase; ongoing; Severity of problem: moderately severe  Patient  and/or Family's Strengths/Protective Factors: Social connections and Sense of purpose  Goals Addressed: Patient will:  Reduce symptoms of: anxiety, depression, insomnia, and stress   Increase knowledge and/or ability of: healthy habits and stress reduction   Demonstrate ability to: Increase healthy adjustment to current life circumstances and Increase motivation to adhere to plan of care  Progress towards Goals: Ongoing    Interventions: Interventions utilized:  Motivational Interviewing and Supportive Reflection Standardized Assessments completed: Not Needed    Patient and/or Family Response: Patient agrees with treatment plan.   Clinical Assessment/Diagnosis  Severe episode of recurrent major depressive disorder, without psychotic features (HCC)  PTSD (post-traumatic stress disorder)  ADHD (attention deficit hyperactivity disorder), combined type  Opioid use disorder, severe, dependence (HCC)  Psychosocial stressors   Patient may benefit from continued therapeutic intervention; continue REACH clinic.   Plan: Follow up with behavioral health clinician on : Two weeks Behavioral recommendations:  -Continue taking BH medications as prescribed: Ambien , Hydroxyzine ; Suboxone  -Discuss concern regarding haunted trail, as well as continue with birth planning, with medical provider at upcoming visit -Continue working with LCAS and sister on housing and pet care options for hospital stay -Continue prioritizing healthy self-care (regular meals, adequate rest; allowing practical help from supportive friends and family)  -Continue to consider camera for Silo's adventures, for greater peace of mind, etc. Referral(s): Integrated [employer] (In Clinic)  I discussed the assessment and treatment plan with the patient and/or parent/guardian. They were provided an opportunity to ask questions and all were answered. They agreed with the plan and demonstrated an  understanding of the instructions.   They were advised to call back or seek an in-person evaluation if the symptoms worsen or if the condition fails to improve as anticipated.  Rojean Ige C Yalda Herd, LCSW  12/02/2023    3:52 PM 09/23/2023    4:46 PM  Depression screen PHQ 2/9  Decreased Interest 1 2  Down, Depressed, Hopeless 1 2  PHQ - 2 Score 2 4  Altered sleeping 3 3  Tired, decreased energy 1 1  Change in appetite 2 2  Feeling bad or failure about yourself  1 2  Trouble concentrating 3 1  Moving slowly or fidgety/restless 1 2  Suicidal thoughts 0 2  PHQ-9 Score 13 17      12/02/2023    3:55 PM 09/23/2023    4:46 PM  GAD 7 : Generalized Anxiety Score  Nervous, Anxious, on Edge 1 2  Control/stop worrying 2 2  Worry too much - different things 2 2  Trouble relaxing 2 2  Restless 0 2  Easily annoyed or irritable 3 2  Afraid - awful might happen 2 3  Total GAD 7 Score 12 15

## 2023-12-03 ENCOUNTER — Ambulatory Visit: Payer: Self-pay | Admitting: Family Medicine

## 2023-12-03 DIAGNOSIS — O0990 Supervision of high risk pregnancy, unspecified, unspecified trimester: Secondary | ICD-10-CM

## 2023-12-09 ENCOUNTER — Encounter: Payer: Self-pay | Admitting: Family Medicine

## 2023-12-09 ENCOUNTER — Other Ambulatory Visit: Payer: Self-pay

## 2023-12-09 ENCOUNTER — Ambulatory Visit: Payer: MEDICAID | Admitting: Family Medicine

## 2023-12-09 VITALS — BP 126/87 | HR 97 | Wt 264.2 lb

## 2023-12-09 DIAGNOSIS — Z3A29 29 weeks gestation of pregnancy: Secondary | ICD-10-CM

## 2023-12-09 DIAGNOSIS — Z2839 Other underimmunization status: Secondary | ICD-10-CM

## 2023-12-09 DIAGNOSIS — O09899 Supervision of other high risk pregnancies, unspecified trimester: Secondary | ICD-10-CM

## 2023-12-09 DIAGNOSIS — Z8619 Personal history of other infectious and parasitic diseases: Secondary | ICD-10-CM | POA: Diagnosis not present

## 2023-12-09 DIAGNOSIS — O0990 Supervision of high risk pregnancy, unspecified, unspecified trimester: Secondary | ICD-10-CM

## 2023-12-09 DIAGNOSIS — Z59819 Housing instability, housed unspecified: Secondary | ICD-10-CM | POA: Diagnosis not present

## 2023-12-09 DIAGNOSIS — O09893 Supervision of other high risk pregnancies, third trimester: Secondary | ICD-10-CM

## 2023-12-09 DIAGNOSIS — F1410 Cocaine abuse, uncomplicated: Secondary | ICD-10-CM

## 2023-12-09 DIAGNOSIS — F1121 Opioid dependence, in remission: Secondary | ICD-10-CM | POA: Diagnosis not present

## 2023-12-09 DIAGNOSIS — F332 Major depressive disorder, recurrent severe without psychotic features: Secondary | ICD-10-CM

## 2023-12-09 DIAGNOSIS — O0993 Supervision of high risk pregnancy, unspecified, third trimester: Secondary | ICD-10-CM

## 2023-12-09 MED ORDER — HYDROXYZINE HCL 25 MG PO TABS: 25.0000 mg | ORAL_TABLET | Freq: Four times a day (QID) | ORAL | 0 refills | Status: DC | PRN

## 2023-12-09 MED ORDER — SERTRALINE HCL 50 MG PO TABS: 50.0000 mg | ORAL_TABLET | Freq: Every day | ORAL | 5 refills | Status: DC

## 2023-12-09 NOTE — Progress Notes (Signed)
 Subjective:  Michelle Stephens is a 23 y.o. G4P0030 at [redacted]w[redacted]d being seen today for ongoing prenatal care.  She is currently monitored for the following issues for this high-risk pregnancy and has MDD (major depressive disorder), recurrent episode, severe; PTSD (post-traumatic stress disorder); ADHD (attention deficit hyperactivity disorder), combined type; Self-inflicted injury; Supervision of high risk pregnancy, antepartum; Opioid use disorder, severe, in early remission (HCC); Substance abuse affecting pregnancy, antepartum (HCC); History of syphilis; Rubella non-immune status, antepartum; Housing insecurity; JIA (juvenile idiopathic arthritis), oligoarthritis, persistent (HCC); Single umbilical artery affecting management of mother in singleton pregnancy, antepartum; and Suboxone  maintenance treatment complicating pregnancy, antepartum (HCC) on their problem list.  Patient reports no complaints.  Contractions: Not present. Vag. Bleeding: None.  Movement: Present. Denies leaking of fluid.   The following portions of the patient's history were reviewed and updated as appropriate: allergies, current medications, past family history, past medical history, past social history, past surgical history and problem list. Problem list updated.  Objective:   Vitals:   12/09/23 1336  BP: 126/87  Pulse: 97  Weight: 264 lb 3.2 oz (119.8 kg)    Fetal Status: Fetal Heart Rate (bpm): 135   Movement: Present     General:  Alert, oriented and cooperative. Patient is in no acute distress.  Skin: Skin is warm and dry. No rash noted.   Cardiovascular: Normal heart rate noted  Respiratory: Normal respiratory effort, no problems with respiration noted  Abdomen: Soft, gravid, appropriate for gestational age. Pain/Pressure: Present     Pelvic: Vag. Bleeding: None     Cervical exam deferred        Extremities: Normal range of motion.  Edema: Trace  Mental Status: Normal mood and affect. Normal behavior. Normal  judgment and thought content.   Urinalysis:      PDMP reviewed during this encounter.    Last UDS: Lab Results  Component Value Date   CREATIUR 158 11/28/2023     Assessment and Plan:  Pregnancy: G4P0030 at [redacted]w[redacted]d  1. Supervision of high risk pregnancy, antepartum (Primary) BP and FHR normal Offered flu, declined  2. Opioid use disorder, severe, in early remission [facility]) S/p inpatient admission for suboxone  induction from 11/14/2023 - 11/16/2023, discharged on suboxone  8 TID>QID Reports she is doing well on this dose Last refill 12/03/2023 Last UDS with ongoing struggles with sobriety (particularly with cocaine  use) but she is moving in the right direction and bup/norbup ratios were appropriate Continue harm reduction approach Discussed that cocaine  is high risk, particularly for abruption, as well as additional NAS risk. She reports she is no longer using and requests a UDS today which I am happy to send  3. History of syphilis S/p bicillin x3  4. Housing insecurity [place] at [facility] and looking for other options  5. Rubella non-immune status, antepartum Offer MMR PP  6. Severe episode of recurrent major depressive disorder, without psychotic features (HCC) Seeing [facility] Would like to start SSRI Start zoloft 50 mg, hydroxyzine  PRN  7. Single umbilical artery affecting management of mother in singleton pregnancy, antepartum Seen on last anatomy US  along with EIF Low risk NIPT Referred for fetal echo with [facility], was done on 11/20/2023 and was normal Plan to start antenatal testing at 34 weeks  Preterm labor symptoms and general obstetric precautions including but not limited to vaginal bleeding, contractions, leaking of fluid and fetal movement were reviewed in detail with the patient. Please refer to After Visit Summary for other counseling recommendations.  Return  in about 2 weeks (around 12/23/2023) for REACH clinic, ob visit.  Future Appointments  Date Time  Provider Department Center  12/16/2023  3:45 PM [facility] HEALTH CLINICIAN [facility]  12/24/2023  1:15 PM [facility] PROVIDER 1 [facility]  12/24/2023  1:30 PM [facility] US2 [facility]     Lola Donnice HERO, MD

## 2023-12-09 NOTE — Patient Instructions (Addendum)
 [facility] [address] [place], [place] [address] [phone] Hours: Sunday Closed Monday 9AM-6PM Tuesday 9AM-6PM Wednesday 9AM-6PM Thursday 9AM-6PM Friday           9AM-6PM Saturday         10 AM-1PM   Birth Control Options Birth control is also called contraception. Birth control prevents pregnancy. There are many types of birth control. Work with your health care provider to find the best option for you. Birth control that uses hormones These types of birth control have hormones in them to prevent pregnancy. Birth control implant This is a small tube that is put into the skin of your arm. The tube can stay in for up to 3 years. Birth control shot These are shots you get every 3 months. Birth control pills This is a pill you take every day. You need to take it at the same time each day. Birth control patch This is a patch that you put on your skin. You change it 1 time each week for 3 weeks. After that, you take the patch off for 1 week. Vaginal ring  This is a soft plastic ring that you put in your vagina. The ring is left in for 3 weeks. Then, you take it out for 1 week. Then, you put a new ring in. Barrier methods  Female condom This is a thin covering that you put on the penis before sex. The condom is thrown away after sex. Female condom This is a soft, loose covering that you put in the vagina before sex. The condom is thrown away after sex. Diaphragm A diaphragm is a soft barrier that is shaped like a bowl. It must be made to fit your body. You put it in the vagina before sex with a chemical that kills sperm called spermicide. A diaphragm should be left in the vagina for 6-8 hours after sex and taken out within 24 hours. You need to replace a diaphragm: Every 1-2 years. After giving birth. After gaining more than 15 lb (6.8 kg). If you have surgery on your pelvis. Cervical cap This is a small, soft cup that fits over the cervix. The cervix is the lowest part of  the uterus. It's put in the vagina before sex, along with spermicide. The cap must be made for you. The cap should be left in for 6-8 hours after sex. It is taken out within 48 hours. A cervical cap must be prescribed and fit to your body by a provider. It should be replaced every 2 years. Sponge This is a small sponge that is put into the vagina before sex. It must be left in for at least 6 hours after sex. It must be taken out within 30 hours and thrown away. Spermicides These are chemicals that kill or stop sperm from getting into the uterus. They may be a pill, cream, jelly, or foam that you put into your vagina. They should be used at least 10-15 minutes before sex. Intrauterine device An intrauterine device (IUD) is a device that's put in the uterus by a provider. There are two types: Hormone IUD. This kind can stay in for 3-5 years. Copper IUD. This kind can stay in for 10 years. Permanent birth control Female tubal ligation This is surgery to block the fallopian tubes. Female sterilization This is a surgery, called a vasectomy, to tie off the tubes that carry sperm in men. This method takes 3 months to work. Other forms of birth control must be  used for 3 months. Natural planning methods This means not having sex on the days the female partner could get pregnant. Here are some types of natural planning birth control: Using a calendar: To keep track of the length of each menstrual cycle. To find out what days pregnancy can happen. To plan to not have sex on days when pregnancy can happen. Watching for signs of ovulation and not having sex during this time. The female partner can check for ovulation by keeping track of their temperature each day. They can also look for changes in the mucus that comes from the cervix. Where to find more information Centers for Disease Control and Prevention: TonerPromos.no. Then: Enter birth control in the search box. This information is not intended to  replace advice given to you by your health care provider. Make sure you discuss any questions you have with your health care provider. Document Revised: 11/14/2022 Document Reviewed: 04/09/2022 Elsevier Patient Education  2024 [employer].

## 2023-12-12 ENCOUNTER — Ambulatory Visit: Payer: MEDICAID

## 2023-12-13 LAB — TOXASSURE FLEX 15, UR
6-ACETYLMORPHINE IA: NEGATIVE ng/mL
7-aminoclonazepam: NOT DETECTED ng/mg{creat}
AMPHETAMINES IA: NEGATIVE ng/mL
Alpha-hydroxyalprazolam: NOT DETECTED ng/mg{creat}
Alpha-hydroxymidazolam: NOT DETECTED ng/mg{creat}
Alpha-hydroxytriazolam: NOT DETECTED ng/mg{creat}
Alprazolam: NOT DETECTED ng/mg{creat}
BARBITURATES IA: NEGATIVE ng/mL
BUPRENORPHINE: POSITIVE
Benzodiazepines: NEGATIVE
Buprenorphine: 165 ng/mg{creat}
COCAINE METABOLITE IA: NEGATIVE ng/mL
Clonazepam: NOT DETECTED ng/mg{creat}
Creatinine: 162 mg/dL (ref >=20)
Desalkylflurazepam: NOT DETECTED ng/mg{creat}
Desmethyldiazepam: NOT DETECTED ng/mg{creat}
Desmethylflunitrazepam: NOT DETECTED ng/mg{creat}
Diazepam: NOT DETECTED ng/mg{creat}
ETHYL ALCOHOL Enzymatic: NEGATIVE g/dL
FENTANYL: NEGATIVE
Fentanyl: NOT DETECTED ng/mg{creat}
Flunitrazepam: NOT DETECTED ng/mg{creat}
Lorazepam: NOT DETECTED ng/mg{creat}
METHADONE IA: NEGATIVE ng/mL
METHADONE MTB IA: NEGATIVE ng/mL
Midazolam: NOT DETECTED ng/mg{creat}
Norbuprenorphine: 574 ng/mg{creat}
Norfentanyl: NOT DETECTED ng/mg{creat}
OPIATE CLASS IA: NEGATIVE ng/mL
OXYCODONE CLASS IA: NEGATIVE ng/mL
Oxazepam: NOT DETECTED ng/mg{creat}
PHENCYCLIDINE IA: NEGATIVE ng/mL
TAPENTADOL, IA: NEGATIVE ng/mL
TRAMADOL IA: NEGATIVE ng/mL
Temazepam: NOT DETECTED ng/mg{creat}

## 2023-12-13 LAB — CANNABINOIDS, MS, UR RFX
Cannabinoids Confirmation: POSITIVE — AB
Carboxy-THC: >617 ng/mg{creat}

## 2023-12-15 ENCOUNTER — Ambulatory Visit: Payer: Self-pay | Admitting: Family Medicine

## 2023-12-15 NOTE — BH Specialist Note (Unsigned)
 Integrated Behavioral Health via Telemedicine Visit  12/16/2023 Michelle Stephens [id number]  Number of Integrated Behavioral Health Clinician visits: 5-Fifth Visit  Session Start time: 1548   Session End time: 1612  Total time in minutes: 24  Referring Provider: Donnice Carolus, MD Patient/Family location: Home [facility] Provider location: [facility]  All persons participating in visit: [name] and Michelle Stephens   Types of Service: Individual psychotherapy and Video visit  I connected with Michelle Stephens and/or Michelle Stephens's n/a via  Telephone or Engineer, civil (consulting)  (Video is Caregility application) and verified that I am speaking with the correct person using two identifiers. Discussed confidentiality: Yes   I discussed the limitations of telemedicine and the availability of in person appointments.  Discussed there is a possibility of technology failure and discussed alternative modes of communication if that failure occurs.  I discussed that engaging in this telemedicine visit, they consent to the provision of behavioral healthcare and the services will be billed under their insurance.  [name] expressed understanding and consented to Telemedicine visit: Yes   Presenting Concerns: Patient and/or family reports the following symptoms/concerns: Ongoing life stress and sleep difficulty;  pt is looking forward to upcoming baby shower; looking for housing options in [facility]; transportation barriers; goal is to get into more permanent housing, to best prepare for baby's arrival.  Duration of problem: Current housing; Severity of problem: moderately severe  Patient and/or Family's Strengths/Protective Factors: Social connections, Concrete supports in place (healthy food, safe environments, etc.), and Sense of purpose  Goals Addressed: Patient will:   Reduce symptoms of: anxiety, depression, insomnia, and stress   Increase knowledge and/or ability of: stress reduction   Demonstrate ability to: Increase healthy adjustment to current life circumstances, Increase adequate support systems for patient/family, and Increase motivation to adhere to plan of care  Progress towards Goals: Ongoing  Interventions: Interventions utilized:  Motivational Interviewing and Link to [employer] Assessments completed: Not Needed    Patient and/or Family Response: Patient agrees with treatment plan.   Clinical Assessment/Diagnosis  Severe episode of recurrent major depressive disorder, without psychotic features (HCC)  PTSD (post-traumatic stress disorder)  ADHD (attention deficit hyperactivity disorder), combined type  Psychosocial stressors  Opioid use disorder   Patient may benefit from continued therapeutic intervention.  Plan: Follow up with behavioral health clinician on : Three weeks Behavioral recommendations:  -Continue taking Ambien , Suboxone , Hydroxyzine , Zoloft as prescribed -Continue housing search, including making calls tomorrow regarding [place] housing option list -Continue plan to enjoy baby shower on Sunday with supportive people in life -On Monday after baby shower, contact insurance case manager regarding options for baby supply benefit -Contact [place] regarding paperwork needed for obtaining reduced-fare bus ID Referral(s): Integrated Behavioral Health Services (In Clinic) and Community Resources:  Housing and Transportation  I discussed the assessment and treatment plan with the patient and/or parent/guardian. They were provided an opportunity to ask questions and all were answered. They agreed with the plan and demonstrated an understanding of the instructions.   They were advised to call back or seek an in-person evaluation if the symptoms worsen or if the condition fails to improve as  anticipated.  Michelle Mineau C Dlynn Ranes, LCSW     10 /08/2023    3:52 PM 09/23/2023    4:46 PM  Depression screen PHQ 2/9  Decreased Interest 1 2  Down, Depressed, Hopeless 1 2  PHQ -  2 Score 2 4  Altered sleeping 3 3  Tired, decreased energy 1 1  Change in appetite 2 2  Feeling bad or failure about yourself  1 2  Trouble concentrating 3 1  Moving slowly or fidgety/restless 1 2  Suicidal thoughts 0 2  PHQ-9 Score 13 17      12/02/2023    3:55 PM 09/23/2023    4:46 PM  GAD 7 : Generalized Anxiety Score  Nervous, Anxious, on Edge 1 2  Control/stop worrying 2 2  Worry too much - different things 2 2  Trouble relaxing 2 2  Restless 0 2  Easily annoyed or irritable 3 2  Afraid - awful might happen 2 3  Total GAD 7 Score 12 15

## 2023-12-16 ENCOUNTER — Ambulatory Visit: Payer: MEDICAID | Admitting: Clinical

## 2023-12-16 DIAGNOSIS — F902 Attention-deficit hyperactivity disorder, combined type: Secondary | ICD-10-CM

## 2023-12-16 DIAGNOSIS — F332 Major depressive disorder, recurrent severe without psychotic features: Secondary | ICD-10-CM

## 2023-12-16 DIAGNOSIS — F4310 Post-traumatic stress disorder, unspecified: Secondary | ICD-10-CM

## 2023-12-16 DIAGNOSIS — Z658 Other specified problems related to psychosocial circumstances: Secondary | ICD-10-CM

## 2023-12-16 DIAGNOSIS — F1190 Opioid use, unspecified, uncomplicated: Secondary | ICD-10-CM

## 2023-12-16 NOTE — Patient Instructions (Addendum)
 [facility] for [facility] for Women [address] [place], [place] [address] [phone] (main office) [phone] [facility] office)  Transportation Resources [facility] Transit Authority [place]) [address], [place], [place] [address] https://www.[place].gov/departments/transportation/gdot-divisions/[place]-transit-agency-public-transportation-division     Fixed-route bus services, including regional fare cards for PART, [place], [place], and WSTA buses.  Reduced fare bus ID's available for Medicaid, Medicare, and orange card recipients.  SCAT offers curb-to-curb and door-to-door bus services for people with disabilities who are unable to use a fixed-bus route; also offers a shared-ride program.   Helpful tips:  -Routes available online and physical maps available at the main bus hub lobby (each for a specific route) -Smartphone directions often include bus routes (see the bus icon, next to the car and walk icons) -Routes differ on weekends, evenings and holidays, so plan ahead!  -If you have Medicaid, Medicare, or orange card, plan to obtain a reduced-fare ID to save 50% on rides. Check days and times to obtain an ID, and bring all necessary documents.   Wheels [address], [place], [place] [address] [phone] www.wheels4hope.org **REFERRAL NEEDED by specific agencies (see website), after meeting specified criteria only    Housing Resources                    [employer] (serves [place], [place], [place], [place], [place], [place], [place], [place], [place], [place], [place], [place], and [place] counties) [address] [place], [place] [address] [phone] DeveloperU.ch  **Rental assistance, Home Rehabilitation,Weatherization Assistance Program, Chief Financial Officer, Housing Voucher Program  [employer] for Housing and [employer] Studies: Proofreader Resources to residents of [place], [place], and [place] Make sure you have your documents ready, including:  (Household income verification: 2 months pay stubs, unemployment/social security award letter, statement of no income for all household members over 63) Photo ID for all household members over 18 Utility Bill/Rent Ledger/Lease: must show past due amount for utilities/rent, or the rental agreement if rent is current 2. Start your application online or by paper (in [place] or [place]) at:     [email or web address].[email or web address]  3. Once you have completed the online application, you will get an email confirmation message from the county. Expect to hear back by phone or email at least 6-10 weeks from submitting your application.  4. While you wait:  Call [phone] to check in on your application Let your landlord know that you've applied. Your landlord will be asked to submit documents (W-9) during this application process. Payments will be made directly to the landlord/property management company and utility company Rent or utility assistance for [employer], [place], and [place] Apply at [email or web address] Questions? Call or email Renee at [phone] or [email or web address]   Eviction Mediation Program: The HOPE Program Https://www.rebuild.BedroomRental.com.cy HOPE Progam serves low-income renters in 14   counties, defined as less than or equal to 80% of the area median income for the county where the renter lives. In the following 12 counties, you should apply to your local rent and utility assistance program INSTEAD OF the HOPE Program: [place], [place], [place], [place], [place], [place], [place], [place], [place], [place], [place], [place]  If you live outside of [place], contact HOPE call center at [phone] to talk to a  Program Representative Monday-Friday, 8am-5pm Note that Native American tribes also received federal funding for rent and utility assistance programs. Recognized members of the following tribes will  be served by programs managed by tribal governments, including: [place], [place], [place], [place] of [place]  and [facility] Eviction Mediation Coordinator, [place], [phone] [email or web address] .[employer], [place], [phone] [email or web address]   Housing Resources [facility]- [place] [address] [place], [place] [address] [phone] www.gha-[place].[facility] [address] [place], [place] [address] [phone] PhoneCaptions.ch **Programs include: Hospital doctor and Housing Counseling, Healthy Radiographer, therapeutic, Homeless Prevention and Housing Assistance  Government [facility] [address], [place], [place] [address] [phone] www.PaintingEmporium.co.za **housing applications/recertification; tax payment relief/exemption under specific qualifications  [facility] [address] [place], [place] [address] www.onlinegreensboro.com/~maryshouse **transitional housing for women in recovery who have minor children or are pregnant  [facility] [address] [place], [place], [place] [address] https://johnson-[email or web address]  **emergency shelter and support services for families facing homelessness  [facility] [address] [place], [place] [address] [phone] www.youthfocus.org **transitional housing to pregnant women; emergency housing for youth who have run away, are experiencing a family crisis, are victims of abuse or neglect, or are homeless  [facility] [address] [place], [place] [address] [phone] ircgso.org **Drop-in center for people  experiencing homelessness; overnight warming center when temperature is 25 degrees or below  Re-Entry Staffing [address] [place], [place], [place] [address] [phone] https://reentrystaffingagency.org/ **help with affordable housing to people experiencing homelessness or unemployment due to incarceration  [facility] [place] [address], [place], [place] [address] [phone] www.greensborourbanministry.org  **emergency and transitional housing, rent/mortgage assistance, utility assistance  [facility] [address] [place], [place] [address] [phone] www.salvationarmyofgreensboro.org **emergency and transitional housing  Habitat for [employer] [address], [place], [place] [address] [phone] Www.habitatgreensboro.[facility]   [employer] [address], [place], [place] [address] [phone] https://chshousing.org **Home Ownership/Affordable Housing Program and [facility]  Housing Consultants Group [address], [place], [place] [address] [phone] PaidValue.com.cy **home buyer education courses, foreclosure prevention  [facility] [address] [place], [place] [address] [phone] DefMagazine.is **Environmental Exposure Assessment (investigation of homes where either children or pregnant women with a confirmed elevated blood lead level reside)  [place]  Division of Vocational Rehabilitation[facility] [address] Genevia LABOR [place], [place] [address] [phone] ShowReturn.ca **Home Expense Assistance/Repairs Program; offers home accessibility updates, such as ramps or bars in the bathroom  Self-Help [employer] [address], [place], [place] [address] [phone] https://www.self-help.org/locations/-branch **Offers  credit-building and banking services to people unable to use traditional banking   Housing Resources [facility]  Housing Authority- [place] of [facility] [address] [place], [place] [address] [phone] ChatRepair.pl   [facility] [address], [place], [place] [address] [phone]  WrestlingMonthly.pl **housing applications/recertification, emergency and transitional housing  Open [employer] of [employer] [address] [place], [place] [address] [phone] www.odm-hp.org  **emergency and permanent housing; rent/mortgage payment assistance  Habitat for [employer], [place] and [place] [address], [place], [place] [address] [phone] https://russell-walls.com/  Family Services of the [place], [employer] [address] [place], [place], [place] [address] www.familyservice-piedmont.org **emergency shelter for victims of domestic violence and sexual assault  [facility] [address], [place], [place] [address] [phone] www.senior-resources-guilford.org **Home expense assistance/repairs for older adults  Housing Resources [facility] [address], [place], [place] [address] [phone] http://cohen-reilly.biz/  **May offer help with minor housing repairs  [facility] [address] [place], [place] [address] [phone] **emergency housing for victims of domestic violence  Housing Resources [place], [place], [place] [facility])  [facility] [address], [place], [place] [address] [phone] www.newrha.[facility] [address], [place], [place] [address] [phone]  [facility] [place] [address], [place], [place] [address] [phone] www.co.[place].Hometown.us  **Housing applications/recertification; tax payment  relief/exemption w specific qualifications  [facility] Help for Homeless [address], [place], [place] [address] [phone] Http://www.rchelpforhomeless.org/HOME_PAGE.html  HELP, Incorporated [facility] [address] [place], [place] [address] [phone] 24 Hour Crisis Line [phone] Hours of Operation: Monday-Friday, 8:30am-5:00pm http://helpincorporated.org **Includes emergency housing for victims of domestic violence

## 2023-12-23 ENCOUNTER — Encounter: Payer: Self-pay | Admitting: Family Medicine

## 2023-12-23 ENCOUNTER — Other Ambulatory Visit: Payer: Self-pay

## 2023-12-23 ENCOUNTER — Ambulatory Visit: Payer: MEDICAID | Admitting: Family Medicine

## 2023-12-23 VITALS — BP 130/85 | HR 105 | Wt 258.2 lb

## 2023-12-23 DIAGNOSIS — Z59819 Housing instability, housed unspecified: Secondary | ICD-10-CM | POA: Diagnosis not present

## 2023-12-23 DIAGNOSIS — F1121 Opioid dependence, in remission: Secondary | ICD-10-CM

## 2023-12-23 DIAGNOSIS — O09893 Supervision of other high risk pregnancies, third trimester: Secondary | ICD-10-CM

## 2023-12-23 DIAGNOSIS — O0993 Supervision of high risk pregnancy, unspecified, third trimester: Secondary | ICD-10-CM

## 2023-12-23 DIAGNOSIS — O09899 Supervision of other high risk pregnancies, unspecified trimester: Secondary | ICD-10-CM

## 2023-12-23 DIAGNOSIS — Z3A31 31 weeks gestation of pregnancy: Secondary | ICD-10-CM

## 2023-12-23 DIAGNOSIS — F332 Major depressive disorder, recurrent severe without psychotic features: Secondary | ICD-10-CM

## 2023-12-23 DIAGNOSIS — Z8619 Personal history of other infectious and parasitic diseases: Secondary | ICD-10-CM | POA: Diagnosis not present

## 2023-12-23 DIAGNOSIS — O0990 Supervision of high risk pregnancy, unspecified, unspecified trimester: Secondary | ICD-10-CM

## 2023-12-23 DIAGNOSIS — Z2839 Other underimmunization status: Secondary | ICD-10-CM

## 2023-12-23 MED ORDER — SERTRALINE HCL 100 MG PO TABS: 100.0000 mg | ORAL_TABLET | Freq: Every day | ORAL | 3 refills | Status: DC

## 2023-12-23 NOTE — Patient Instructions (Signed)

## 2023-12-23 NOTE — Progress Notes (Signed)
 Subjective:  Michelle Stephens is a 23 y.o. G4P0030 at [redacted]w[redacted]d being seen today for ongoing prenatal care.  She is currently monitored for the following issues for this high-risk pregnancy and has MDD (major depressive disorder), recurrent episode, severe; PTSD (post-traumatic stress disorder); ADHD (attention deficit hyperactivity disorder), combined type; Self-inflicted injury; Supervision of high risk pregnancy, antepartum; Opioid use disorder, severe, in early remission (HCC); Substance abuse affecting pregnancy, antepartum (HCC); History of syphilis; Rubella non-immune status, antepartum; Housing insecurity; JIA (juvenile idiopathic arthritis), oligoarthritis, persistent (HCC); Single umbilical artery affecting management of mother in singleton pregnancy, antepartum; and Suboxone  maintenance treatment complicating pregnancy, antepartum (HCC) on their problem list.  Patient reports no complaints.  Contractions: Not present. Vag. Bleeding: None.  Movement: Present. Denies leaking of fluid.   The following portions of the patient's history were reviewed and updated as appropriate: allergies, current medications, past family history, past medical history, past social history, past surgical history and problem list. Problem list updated.  Objective:   Vitals:   12/23/23 1337  BP: 130/85  Pulse: (!) 105  Weight: 258 lb 3.2 oz (117.1 kg)    Fetal Status: Fetal Heart Rate (bpm): 142   Movement: Present     General:  Alert, oriented and cooperative. Patient is in no acute distress.  Skin: Skin is warm and dry. No rash noted.   Cardiovascular: Normal heart rate noted  Respiratory: Normal respiratory effort, no problems with respiration noted  Abdomen: Soft, gravid, appropriate for gestational age. Pain/Pressure: Absent     Pelvic: Vag. Bleeding: None     Cervical exam deferred        Extremities: Normal range of motion.  Edema: Trace  Mental Status: Normal mood and affect. Normal behavior.  Normal judgment and thought content.   Urinalysis:      PDMP not reviewed this encounter.    Last UDS: Lab Results  Component Value Date   CREATIUR 162 12/09/2023     Assessment and Plan:  Pregnancy: G4P0030 at [redacted]w[redacted]d  1. Supervision of high risk pregnancy, antepartum (Primary) BP and FHR normal  2. Opioid use disorder, severe, in early remission (HCC) Doing well on suboxone  8 QID, last UDS such an improvement! Defer UDS to next visit  3. History of syphilis S/p bicillin x3  4. Housing insecurity Same old same old, still at [facility]  5. Rubella non-immune status, antepartum Offer MMR PP  6. Severe episode of recurrent major depressive disorder, without psychotic features (HCC) Seeing [facility], started on zoloft 50 mg last visit Today reports she doesn't feel much different outside of sleep, still feeling really anxious Recommended increasing to 100 mg at bedtime, she is amenable to try  7. Single umbilical artery affecting management of mother in singleton pregnancy, antepartum Seen on last anatomy US  along with EIF Low risk NIPT Referred for fetal echo with [facility], was done on 11/20/2023 and was normal Plan to start antenatal testing at 34 weeks Next growth US  scheduled for tomorrow  Preterm labor symptoms and general obstetric precautions including but not limited to vaginal bleeding, contractions, leaking of fluid and fetal movement were reviewed in detail with the patient. Please refer to After Visit Summary for other counseling recommendations.  Return in about 2 weeks (around 01/06/2024) for REACH clinic, ob visit.  Future Appointments  Date Time Provider Department Center  12/24/2023  1:15 PM [facility] PROVIDER 1 [facility]  12/24/2023  1:30 PM [facility] US2 [facility]  01/06/2024 10:15 AM [facility]  HEALTH CLINICIAN [facility]     Lola Donnice HERO, MD

## 2023-12-23 NOTE — Progress Notes (Signed)
 Subjective:   Michelle Stephens is a 23 y.o. G4P0030 here today for ongoing prenatal care, substance use management and substance exposed newborn preparation.  Abbagayle reports feeling well and being grateful for the opportunity for an inpatient buprenorphine  induction.  She endorses few to no cravings and feels this course is much easier than she anticipated.  Health Maintenance Due  Topic Date Due   HPV VACCINES (1 - 3-dose series) Never done   Hepatitis B Vaccines 19-59 Average Risk (1 of 3 - 19+ 3-dose series) Never done   Influenza Vaccine  Never done   COVID-19 Vaccine (1 - 2025-26 season) Never done    Past Medical History:  Diagnosis Date   ADHD (attention deficit hyperactivity disorder)    Allergy    seasonal   Anxiety    Asthma    when young   Depression    Eczema    GERD (gastroesophageal reflux disease)    Headache(784.0)    Heart murmur    when a baby   JRA (juvenile rheumatoid arthritis) (HCC)    Obesity    Urinary tract infection    Vision abnormalities     Past Surgical History:  Procedure Laterality Date   WISDOM TOOTH EXTRACTION  2019    The following portions of the patient's history were reviewed and updated as appropriate: allergies, current medications, past family history, past medical history, past social history, past surgical history and problem list.     Objective:  Percy is well appearing with clear thoughts and communication. She is accompanied by her partner today.  Assessment and Plan:  Demetric is uncertain if her current partner will be present for labor and delivery and newborn observation period.  She identifies her brother, brother's girlfriend and sister as he support people for her hospitalization.  We reviewed Eat, Sleep [employer] Console management and [employer] producer availability to assist with the 5 day newborn observation period.  She desires to breastfeed and we talked about the available lactation resources; she  currently has a breast pump.  She is enrolled in [facility] with her next visit on 11/5. She recently had a baby shower and is beginning to acquire newborn items.  She mentioned possibly co-sleeping with infant and we reviewed the AAP recommendations for safe sleep-she is aware of our [facility] program if she is unable to obtain a sleep environment for her baby.  Congratulated Virgilene on her most recent UDS as she was very excited to report the results.  We discussed the role of the hospital social worker as well as CPS.  We talked about consistency in clinic attendance and that the [facility] team is here to advocate for her. She has the [facility] consult contact information for needs that arise prior to the next visit. Problem List Items Addressed This Visit       Cardiovascular and Mediastinum   Single umbilical artery affecting management of mother in singleton pregnancy, antepartum     Other   MDD (major depressive disorder), recurrent episode, severe   Relevant Medications   sertraline (ZOLOFT) 100 MG tablet   Supervision of high risk pregnancy, antepartum - Primary   Opioid use disorder, severe, in early remission (HCC)   History of syphilis   Rubella non-immune status, antepartum   Housing insecurity    Routine preventative health maintenance measures emphasized. Please refer to After Visit Summary for other counseling recommendations.   Return in about 2 weeks (around 01/06/2024) for [facility] clinic,  ob visit.    Total face-to-face time with patient: 20 minutes.  Over 50% of encounter was spent on counseling and coordination of care.   Delon LITTIE Frater, NNP-BC Neonatal Nurse Practitioner Substance Exposed Newborn Consult at the [facility] [phone]

## 2023-12-23 NOTE — Addendum Note (Signed)
 Addended by: ROLANDE DELON CROME on: 12/23/2023 02:46 PM   Modules accepted: Level of Service

## 2023-12-24 ENCOUNTER — Ambulatory Visit: Payer: MEDICAID | Attending: Obstetrics | Admitting: Maternal & Fetal Medicine

## 2023-12-24 ENCOUNTER — Ambulatory Visit: Payer: MEDICAID

## 2023-12-24 ENCOUNTER — Other Ambulatory Visit: Payer: Self-pay | Admitting: Obstetrics

## 2023-12-24 ENCOUNTER — Ambulatory Visit (HOSPITAL_BASED_OUTPATIENT_CLINIC_OR_DEPARTMENT_OTHER): Payer: MEDICAID

## 2023-12-24 ENCOUNTER — Other Ambulatory Visit: Payer: Self-pay | Admitting: *Deleted

## 2023-12-24 VITALS — BP 109/69 | HR 73

## 2023-12-24 DIAGNOSIS — O99213 Obesity complicating pregnancy, third trimester: Secondary | ICD-10-CM | POA: Diagnosis not present

## 2023-12-24 DIAGNOSIS — O99323 Drug use complicating pregnancy, third trimester: Secondary | ICD-10-CM | POA: Diagnosis not present

## 2023-12-24 DIAGNOSIS — O43893 Other placental disorders, third trimester: Secondary | ICD-10-CM | POA: Diagnosis not present

## 2023-12-24 DIAGNOSIS — F1120 Opioid dependence, uncomplicated: Secondary | ICD-10-CM

## 2023-12-24 DIAGNOSIS — O09899 Supervision of other high risk pregnancies, unspecified trimester: Secondary | ICD-10-CM | POA: Diagnosis not present

## 2023-12-24 DIAGNOSIS — O99212 Obesity complicating pregnancy, second trimester: Secondary | ICD-10-CM

## 2023-12-24 DIAGNOSIS — O99320 Drug use complicating pregnancy, unspecified trimester: Secondary | ICD-10-CM

## 2023-12-24 DIAGNOSIS — O0990 Supervision of high risk pregnancy, unspecified, unspecified trimester: Secondary | ICD-10-CM | POA: Diagnosis not present

## 2023-12-24 DIAGNOSIS — O2623 Pregnancy care for patient with recurrent pregnancy loss, third trimester: Secondary | ICD-10-CM | POA: Insufficient documentation

## 2023-12-24 DIAGNOSIS — F419 Anxiety disorder, unspecified: Secondary | ICD-10-CM | POA: Diagnosis not present

## 2023-12-24 DIAGNOSIS — O09293 Supervision of pregnancy with other poor reproductive or obstetric history, third trimester: Secondary | ICD-10-CM | POA: Insufficient documentation

## 2023-12-24 DIAGNOSIS — O99343 Other mental disorders complicating pregnancy, third trimester: Secondary | ICD-10-CM | POA: Insufficient documentation

## 2023-12-24 DIAGNOSIS — Z3A31 31 weeks gestation of pregnancy: Secondary | ICD-10-CM

## 2023-12-24 DIAGNOSIS — O365930 Maternal care for other known or suspected poor fetal growth, third trimester, not applicable or unspecified: Secondary | ICD-10-CM | POA: Insufficient documentation

## 2023-12-24 DIAGNOSIS — O359XX0 Maternal care for (suspected) fetal abnormality and damage, unspecified, not applicable or unspecified: Secondary | ICD-10-CM

## 2023-12-24 DIAGNOSIS — F1121 Opioid dependence, in remission: Secondary | ICD-10-CM | POA: Diagnosis not present

## 2023-12-24 DIAGNOSIS — F1910 Other psychoactive substance abuse, uncomplicated: Secondary | ICD-10-CM

## 2023-12-24 DIAGNOSIS — E669 Obesity, unspecified: Secondary | ICD-10-CM | POA: Diagnosis not present

## 2023-12-24 DIAGNOSIS — O99322 Drug use complicating pregnancy, second trimester: Secondary | ICD-10-CM

## 2023-12-24 DIAGNOSIS — O365990 Maternal care for other known or suspected poor fetal growth, unspecified trimester, not applicable or unspecified: Secondary | ICD-10-CM

## 2023-12-24 DIAGNOSIS — F1190 Opioid use, unspecified, uncomplicated: Secondary | ICD-10-CM | POA: Diagnosis not present

## 2023-12-24 NOTE — Progress Notes (Signed)
 Patient information  Patient Name: Michelle Stephens  Patient MRN:   [id number]  Referring practice: MFM Referring Provider: [facility] ([facility])  Problem List   Patient Active Problem List   Diagnosis Date Noted   Single umbilical artery affecting management of mother in singleton pregnancy, antepartum 12/01/2023   Suboxone  maintenance treatment complicating pregnancy, antepartum (HCC) 12/01/2023   Housing insecurity 10/07/2023   Rubella non-immune status, antepartum 09/24/2023   Supervision of high risk pregnancy, antepartum 09/23/2023   Opioid use disorder, severe, in early remission (HCC) 09/23/2023   Substance abuse affecting pregnancy, antepartum (HCC) 09/23/2023   History of syphilis 09/23/2023   Self-inflicted injury    JIA (juvenile idiopathic arthritis), oligoarthritis, persistent (HCC) 06/02/2014   PTSD (post-traumatic stress disorder) 02/04/2013   ADHD (attention deficit hyperactivity disorder), combined type 02/04/2013   MDD (major depressive disorder), recurrent episode, severe 11/07/2011    Class: Diagnosis of    Maternal Fetal medicine Consult  Michelle Stephens is a 23 y.o. G4P0030 at [redacted]w[redacted]d here for ultrasound and consultation. Michelle Stephens is doing well today with no acute concerns. Today we focused on the following:   Fetal growth restriction  I discussed the diagnosis, management and prognosis of fetal growth restriction (FGR) with the patient today. I discussed the various causes of growth restriction including constitutionally small fetus, placental insufficiency, genetic problems and chronic maternal disease.  It could be also related to the two-vessel umbilical cord.  Currently there is no evidence of sonographic stigmata suggesting infection or aneuploidy.  I discussed the most likely cause of her fetal growth restriction is either a constitutionally small fetus or placental insufficiency/two-vessel umbilical cord. We discussed it is often  challenging to differentiate between a fetus that is constitutionally small but is fulfilling its growth potential and a fetus that is not fulfilling its growth potential because of an underlying pathologic condition. Approximately 70% of fetuses with birth weight below the 10th percentile for gestational age are constitutionally small; in the remaining 30%, the cause of the small size is pathologic, meeting intrauterine growth restriction definition.  However, the Stephens the fetus in the earlier onset of growth restriction the more likely there is a pathologic process causing the growth restriction.    The patient had time to ask questions that were answered to her satisfaction.  She verbalized understanding and agrees to proceed with the plan below.  Sonographic findings Single intrauterine pregnancy at 31w 1d. Fetal cardiac activity:  Observed and appears normal. Presentation: Cephalic. Interval fetal anatomy appears normal. Fetal biometry shows the estimated fetal weight at the 4.4 percentile and the abdominal circumference at the 4 percentile.  Amniotic fluid: Within normal limits.  MVP: 3.7 cm. Placenta: Posterior. Umbilical artery dopplers findings: -S/D:3.19 which are normal at this gestational age.  -Absent end-diastolic flow: No.  -Reversed end-diastolic flow:  No.  Recommendations - Continue UA dopplers and antenatal testing as scheduled. - The patient could not stay for an NST so she will come back tomorrow  Review of Systems: A review of systems was performed and was negative except per HPI   Vitals and Physical Exam    12/24/2023    1:18 PM 12/23/2023    1:37 PM 12/09/2023    1:36 PM  Vitals with BMI  Weight  258 lbs 3 oz 264 lbs 3 oz  BMI   42.66  Systolic 109 130 873  Diastolic 69 85 87  Pulse 73 105 97    Sitting comfortably on  the sonogram table Nonlabored breathing Normal rate and rhythm Abdomen is nontender  Past pregnancies OB History  Gravida Para  Term Preterm AB Living  4 0 0 0 3 0  SAB IAB Ectopic Multiple Live Births  3 0 0 0 0    # Outcome Date GA Lbr Len/2nd Weight Sex Type Anes PTL Lv  4 Current           3 SAB           2 SAB           1 SAB              I spent 30 minutes reviewing the patients chart, including labs and images as well as counseling the patient about her medical conditions. Greater than 50% of the time was spent in direct face-to-face patient counseling.  Michelle Stephens  MFM, [place]   12/24/2023  2:13 PM

## 2023-12-25 ENCOUNTER — Ambulatory Visit: Payer: MEDICAID

## 2023-12-26 ENCOUNTER — Ambulatory Visit: Payer: MEDICAID | Attending: Maternal & Fetal Medicine | Admitting: *Deleted

## 2023-12-26 DIAGNOSIS — O99213 Obesity complicating pregnancy, third trimester: Secondary | ICD-10-CM | POA: Diagnosis present

## 2023-12-26 DIAGNOSIS — O365930 Maternal care for other known or suspected poor fetal growth, third trimester, not applicable or unspecified: Secondary | ICD-10-CM | POA: Diagnosis not present

## 2023-12-26 DIAGNOSIS — Z3A31 31 weeks gestation of pregnancy: Secondary | ICD-10-CM | POA: Insufficient documentation

## 2023-12-26 NOTE — Procedures (Signed)
 Michelle Stephens 2001/01/22 [redacted]w[redacted]d  Fetus A Non-Stress Test Interpretation for 12/26/23- NST only  Indication: M.O., drug use  Fetal Heart Rate A Mode: External Baseline Rate (A): 135 bpm Variability: Moderate Accelerations: 10 x 10 Decelerations: Variable Multiple birth?: No  Uterine Activity Mode: Toco Contraction Frequency (min): none Resting Tone Palpated: Relaxed  Interpretation (Fetal Testing) Nonstress Test Interpretation: Reactive Comments: tracing reviewed by Dr. William

## 2023-12-29 ENCOUNTER — Other Ambulatory Visit: Payer: Self-pay | Admitting: Advanced Practice Midwife

## 2023-12-29 DIAGNOSIS — F5105 Insomnia due to other mental disorder: Secondary | ICD-10-CM

## 2023-12-30 ENCOUNTER — Other Ambulatory Visit: Payer: Self-pay | Admitting: Advanced Practice Midwife

## 2023-12-31 ENCOUNTER — Other Ambulatory Visit: Payer: Self-pay | Admitting: Advanced Practice Midwife

## 2023-12-31 DIAGNOSIS — F5105 Insomnia due to other mental disorder: Secondary | ICD-10-CM

## 2023-12-31 DIAGNOSIS — F1120 Opioid dependence, uncomplicated: Secondary | ICD-10-CM

## 2023-12-31 MED ORDER — ZOLPIDEM TARTRATE 5 MG PO TABS
5.0000 mg | ORAL_TABLET | Freq: Every day | ORAL | 1 refills | Status: DC
Start: 2023-12-31 — End: 2024-01-20

## 2023-12-31 MED ORDER — BUPRENORPHINE HCL-NALOXONE HCL 8-2 MG SL FILM
1.0000 | ORAL_FILM | Freq: Four times a day (QID) | SUBLINGUAL | 0 refills | Status: DC
Start: 2023-12-31 — End: 2024-01-20

## 2024-01-01 ENCOUNTER — Telehealth: Payer: Self-pay | Admitting: Lactation Services

## 2024-01-01 NOTE — Telephone Encounter (Signed)
 Michelle Stephens (Key: [id number]) PA Case ID #: [id number] Need Help? Call us  at [phone] Outcome Approved today by [employer] 2017 Approved. ZOLPIDEM  TARTRATE 5MG  Tablet is approved from 01/01/2024 to 06/30/2024. All strengths of the drug are approved. Effective Date: 01/01/2024 Authorization Expiration Date: 06/30/2024 Drug Zolpidem  Tartrate 5MG  tablets ePA cloud logo Form [employer] Electronic Prior Authorization Form  Called Pharmacy to let them know that Ambien  has been approved. Spoke with Natalie. Prescription ran through, they will call the patient to notify.

## 2024-01-01 NOTE — Telephone Encounter (Signed)
 PA for Ambien  submitted through [employer], awaiting determination.   Michelle Stephens (Key: [id number]) PA Case ID #: [id number] Need Help? Call us  at [phone] Status sent iconSent to Plan today Drug Zolpidem  Tartrate 5MG  tablets ePA cloud logo Form [employer] Electronic Prior Authorization Form

## 2024-01-02 ENCOUNTER — Ambulatory Visit: Payer: MEDICAID | Admitting: *Deleted

## 2024-01-02 ENCOUNTER — Ambulatory Visit (HOSPITAL_BASED_OUTPATIENT_CLINIC_OR_DEPARTMENT_OTHER): Payer: MEDICAID | Admitting: Maternal & Fetal Medicine

## 2024-01-02 ENCOUNTER — Other Ambulatory Visit: Payer: Self-pay | Admitting: *Deleted

## 2024-01-02 ENCOUNTER — Ambulatory Visit: Payer: MEDICAID | Attending: Maternal & Fetal Medicine

## 2024-01-02 VITALS — BP 130/77 | HR 71

## 2024-01-02 DIAGNOSIS — O99333 Smoking (tobacco) complicating pregnancy, third trimester: Secondary | ICD-10-CM

## 2024-01-02 DIAGNOSIS — O365930 Maternal care for other known or suspected poor fetal growth, third trimester, not applicable or unspecified: Secondary | ICD-10-CM

## 2024-01-02 DIAGNOSIS — O99320 Drug use complicating pregnancy, unspecified trimester: Secondary | ICD-10-CM | POA: Insufficient documentation

## 2024-01-02 DIAGNOSIS — E669 Obesity, unspecified: Secondary | ICD-10-CM | POA: Diagnosis not present

## 2024-01-02 DIAGNOSIS — O99213 Obesity complicating pregnancy, third trimester: Secondary | ICD-10-CM | POA: Insufficient documentation

## 2024-01-02 DIAGNOSIS — O43193 Other malformation of placenta, third trimester: Secondary | ICD-10-CM

## 2024-01-02 DIAGNOSIS — F1910 Other psychoactive substance abuse, uncomplicated: Secondary | ICD-10-CM

## 2024-01-02 DIAGNOSIS — F1110 Opioid abuse, uncomplicated: Secondary | ICD-10-CM | POA: Insufficient documentation

## 2024-01-02 DIAGNOSIS — O365990 Maternal care for other known or suspected poor fetal growth, unspecified trimester, not applicable or unspecified: Secondary | ICD-10-CM

## 2024-01-02 DIAGNOSIS — O359XX0 Maternal care for (suspected) fetal abnormality and damage, unspecified, not applicable or unspecified: Secondary | ICD-10-CM | POA: Diagnosis present

## 2024-01-02 DIAGNOSIS — F1120 Opioid dependence, uncomplicated: Secondary | ICD-10-CM | POA: Insufficient documentation

## 2024-01-02 DIAGNOSIS — O0990 Supervision of high risk pregnancy, unspecified, unspecified trimester: Secondary | ICD-10-CM

## 2024-01-02 DIAGNOSIS — F1121 Opioid dependence, in remission: Secondary | ICD-10-CM

## 2024-01-02 DIAGNOSIS — Z3A32 32 weeks gestation of pregnancy: Secondary | ICD-10-CM | POA: Diagnosis present

## 2024-01-02 DIAGNOSIS — F1190 Opioid use, unspecified, uncomplicated: Secondary | ICD-10-CM

## 2024-01-02 NOTE — Procedures (Signed)
 Lindsee R Bohanon [birth year] [redacted]w[redacted]d  Fetus A Non-Stress Test Interpretation for 01/02/24-NST with BPP  Indication: IUGR and opiod use, obesity  Fetal Heart Rate A Mode: External Baseline Rate (A): 135 bpm Variability: Moderate Accelerations: 15 x 15 Decelerations: None Multiple birth?: No  Uterine Activity Mode: Toco Contraction Frequency (min): none Resting Tone Palpated: Relaxed  Interpretation (Fetal Testing) Nonstress Test Interpretation: Reactive Comments: Tracing reviewed by dR. Schaible

## 2024-01-02 NOTE — Progress Notes (Signed)
 SABRA

## 2024-01-02 NOTE — Progress Notes (Signed)
   Patient information  Patient Name: LIVIE VANDERHOOF  Patient MRN:   [id number]  Referring practice: MFM Referring Provider: [facility] ([facility])  Problem List   Patient Active Problem List   Diagnosis Date Noted   Single umbilical artery affecting management of mother in singleton pregnancy, antepartum 12/01/2023   Suboxone  maintenance treatment complicating pregnancy, antepartum (HCC) 12/01/2023   Housing insecurity 10/07/2023   Rubella non-immune status, antepartum 09/24/2023   Supervision of high risk pregnancy, antepartum 09/23/2023   Opioid use disorder, severe, in early remission (HCC) 09/23/2023   Substance abuse affecting pregnancy, antepartum (HCC) 09/23/2023   History of syphilis 09/23/2023   Self-inflicted injury    JIA (juvenile idiopathic arthritis), oligoarthritis, persistent (HCC) 06/02/2014   PTSD (post-traumatic stress disorder) 02/04/2013   ADHD (attention deficit hyperactivity disorder), combined type 02/04/2013   MDD (major depressive disorder), recurrent episode, severe 11/07/2011    Class: Diagnosis of    Maternal Fetal medicine Consult  TERI LEGACY is a 23 y.o. G4P0030 at [redacted]w[redacted]d here for ultrasound and consultation. Aliyha R Olejnik is doing well today with no acute concerns. Today we focused on the following:   FGR: The previous growth was at the 4th percentile overall on 12/24/2023 and the patient was 31 weeks 1 day.  She is here for umbilical artery Dopplers and a BPP.  I reassured the patient that the ultrasound findings are reassuring with normal Dopplers and a BPP that is satisfactory.  She reports good fetal movement.  Fetal movement precautions given.  The patient had time to ask questions that were answered to her satisfaction.  She verbalized understanding and agrees to proceed with the plan below.  Recommendations - Continue weekly Dopplers and antenatal testing with growth every 3 weeks.  Delivery timing pending clinical  course.  Review of Systems: A review of systems was performed and was negative except per HPI   Vitals and Physical Exam    01/02/2024   12:49 PM 12/24/2023    1:18 PM 12/23/2023    1:37 PM  Vitals with BMI  Weight   258 lbs 3 oz  Systolic 130 109 869  Diastolic 77 69 85  Pulse 71 73 105    Sitting comfortably on the sonogram table Nonlabored breathing Normal rate and rhythm Abdomen is nontender  Past pregnancies OB History  Gravida Para Term Preterm AB Living  4 0 0 0 3 0  SAB IAB Ectopic Multiple Live Births  3 0 0 0 0    # Outcome Date GA Lbr Len/2nd Weight Sex Type Anes PTL Lv  4 Current           3 SAB           2 SAB           1 SAB              I spent 20 minutes reviewing the patients chart, including labs and images as well as counseling the patient about her medical conditions. Greater than 50% of the time was spent in direct face-to-face patient counseling.  Delora Smaller  MFM, [place]   01/02/2024  1:42 PM

## 2024-01-03 ENCOUNTER — Other Ambulatory Visit: Payer: Self-pay

## 2024-01-03 ENCOUNTER — Encounter (HOSPITAL_COMMUNITY): Payer: Self-pay | Admitting: Family Medicine

## 2024-01-03 ENCOUNTER — Inpatient Hospital Stay (HOSPITAL_COMMUNITY)
Admission: AD | Admit: 2024-01-03 | Discharge: 2024-01-03 | Disposition: A | Discharge status: Home / Self Care | Payer: MEDICAID | Attending: Family Medicine | Admitting: Family Medicine

## 2024-01-03 DIAGNOSIS — O26893 Other specified pregnancy related conditions, third trimester: Secondary | ICD-10-CM | POA: Diagnosis not present

## 2024-01-03 DIAGNOSIS — R0602 Shortness of breath: Secondary | ICD-10-CM | POA: Diagnosis not present

## 2024-01-03 DIAGNOSIS — O368130 Decreased fetal movements, third trimester, not applicable or unspecified: Secondary | ICD-10-CM

## 2024-01-03 DIAGNOSIS — R109 Unspecified abdominal pain: Secondary | ICD-10-CM | POA: Insufficient documentation

## 2024-01-03 DIAGNOSIS — Z3A32 32 weeks gestation of pregnancy: Secondary | ICD-10-CM

## 2024-01-03 DIAGNOSIS — M549 Dorsalgia, unspecified: Secondary | ICD-10-CM | POA: Insufficient documentation

## 2024-01-03 DIAGNOSIS — Z3689 Encounter for other specified antenatal screening: Secondary | ICD-10-CM

## 2024-01-03 LAB — URINALYSIS, ROUTINE W REFLEX MICROSCOPIC
Bilirubin Urine: NEGATIVE
Glucose, UA: NEGATIVE mg/dL
Hgb urine dipstick: NEGATIVE
Ketones, ur: NEGATIVE mg/dL
Leukocytes,Ua: NEGATIVE
Nitrite: NEGATIVE
Protein, ur: NEGATIVE mg/dL
Specific Gravity, Urine: 1.026 (ref 1.005–1.030)
pH: 6 (ref 5.0–8.0)

## 2024-01-03 NOTE — Discharge Instructions (Signed)
   PREGNANCY SUPPORT BELT: You are not alone, Seventy-five percent of women have some sort of abdominal or back pain at some point in their pregnancy. Your baby is growing at a fast pace, which means that your whole body is rapidly trying to adjust to the changes. As your uterus grows, your back may start feeling a bit under stress and this can result in back or abdominal pain that can go from mild, and therefore bearable, to severe pains that will not allow you to sit or lay down comfortably, When it comes to dealing with pregnancy-related pains and cramps, some pregnant women usually prefer natural remedies, which the market is filled with nowadays. For example, wearing a pregnancy support belt can help ease and lessen your discomfort and pain. WHAT ARE THE BENEFITS OF WEARING A PREGNANCY SUPPORT BELT? A pregnancy support belt provides support to the lower portion of the belly taking some of the weight of the growing uterus and distributing to the other parts of your body. It is designed make you comfortable and gives you extra support. Over the years, the pregnancy apparel market has been studying the needs and wants of pregnant women and they have come up with the most comfortable pregnancy support belts that woman could ever ask for. In fact, you will no longer have to wear a stretched-out or bulky pregnancy belt that is visible underneath your clothes and makes you feel even more uncomfortable. Nowadays, a pregnancy support belt is made of comfortable and stretchy materials that will not irritate your skin but will actually make you feel at ease and you will not even notice you are wearing it. They are easy to put on and adjust during the day and can be worn at night for additional support.  BENEFITS: Relives Back pain Relieves Abdominal Muscle and Leg Pain Stabilizes the Pelvic Ring Offers a Cushioned Abdominal Lift Pad Relieves pressure on the Sciatic Nerve Within Minutes    Locations that Carry  Maternity/Pregnancy Support Belts  You can find belts on [employer] starting at $10-$15.  2.  The [facility] carries belts for $10-$15.        Address/Phone: [address], [address], [place], [place] [address], [phone]  3.  You may be able to file your insurance with [email or web address] and have a belt mailed to you.  If you have any problems getting the belt, let your [facility] office know.  

## 2024-01-03 NOTE — MAU Provider Note (Signed)
 History     CSN: [id number]  Arrival date and time: 01/03/24 9782   Event Date/Time   First Provider Initiated Contact with Patient 01/03/24 0244      Chief Complaint  Patient presents with   Back Pain   Abdominal Pain   Decreased Fetal Movement   Michelle Stephens is a 23 y.o. G4P0030 at [redacted]w[redacted]d who receives care at [facility].  She presents today for  DFM, SOB, and Abdominal/Back Pain.  She reports she has not felt fetal movement since about midnight.  She reports she tried to lay down and was experiencing SOB that she describes as not feeling like my lungs are getting to capacity.  She also reports abdominal and back pain that she states are both low.  She rates the pain a 4 in her abdominal and 7 in her back.  She thinks the pain may be from a UTI because she feels she is not emptying her bladder.   OB History     Gravida  4   Para  0   Term  0   Preterm  0   AB  3   Living  0      SAB  3   IAB  0   Ectopic  0   Multiple  0   Live Births  0           Past Medical History:  Diagnosis Date   ADHD (attention deficit hyperactivity disorder)    Allergy    seasonal   Anxiety    Asthma    when young   Depression    Eczema    GERD (gastroesophageal reflux disease)    Headache(784.0)    Heart murmur    when a baby   JRA (juvenile rheumatoid arthritis) (HCC)    Obesity    Urinary tract infection    Vision abnormalities     Past Surgical History:  Procedure Laterality Date   WISDOM TOOTH EXTRACTION  2019    Family History  Problem Relation Age of Onset   Asthma Mother    Diabetes Mother    Hypertension Mother    Depression Mother    Mental illness Mother    Kidney failure Mother        stage 4 at time of death   Heart failure Mother        stage 4 at time of death   Liver disease Mother        at time of death   Other Father        unknown    Social History   Tobacco Use   Smoking status: Former    Current packs/day: 0.25     Types: Cigarettes   Smokeless tobacco: Former  Building Services Engineer status: Every Day   Substances: Nicotine , Flavoring  Substance Use Topics   Alcohol use: Not Currently   Drug use: Yes    Types: Marijuana, Cocaine , Fentanyl    Allergies:  Allergies  Allergen Reactions   Coconut Flavoring Agent (Non-Screening) Hives    Medications Prior to Admission  Medication Sig Dispense Refill Last Dose/Taking   acetaminophen  (TYLENOL ) 500 MG tablet Take 1,000 mg by mouth every 6 (six) hours as needed for mild pain, moderate pain or headache. (Patient not taking: Reported on 12/09/2023)      Buprenorphine  HCl-Naloxone  HCl (SUBOXONE ) 8-2 MG FILM Place 1 Film under the tongue 4 (four) times daily. 120 Film 0  calcium  carbonate (TUMS - DOSED IN MG ELEMENTAL CALCIUM ) 500 MG chewable tablet Chew 1 tablet by mouth as needed for indigestion or heartburn.      dicyclomine  (BENTYL ) 10 MG capsule Take 1 capsule (10 mg total) by mouth 3 (three) times daily as needed (abdominal spasms). (Patient not taking: Reported on 12/09/2023) 30 capsule 0    famotidine  (PEPCID ) 20 MG tablet Take 1 tablet (20 mg total) by mouth 2 (two) times daily. 60 tablet 0    hydrOXYzine  (ATARAX ) 25 MG tablet Take 1 tablet (25 mg total) by mouth every 6 (six) hours as needed for anxiety. 30 tablet 0    naloxone  (NARCAN ) nasal spray 4 mg/0.1 mL Use as needed to reverse opioid overdose 2 each 11    ondansetron  (ZOFRAN -ODT) 4 MG disintegrating tablet Take 1-2 tablets (4-8 mg total) by mouth every 8 (eight) hours as needed for nausea. 40 tablet 2    Prenatal Vit-Fe Fumarate-FA (MULTIVITAMIN-PRENATAL) 27-0.8 MG TABS tablet Take 1 tablet by mouth daily at 12 noon. 30 tablet 12    senna (SENOKOT) 8.6 MG TABS tablet Take 2 tablets (17.2 mg total) by mouth daily as needed for mild constipation. 60 tablet 0    sertraline (ZOLOFT) 100 MG tablet Take 1 tablet (100 mg total) by mouth at bedtime. 90 tablet 3    zolpidem  (AMBIEN ) 5 MG tablet  Take 1 tablet (5 mg total) by mouth at bedtime for 15 days. 15 tablet 1     Review of Systems  Respiratory:  Positive for shortness of breath. Negative for cough.   Gastrointestinal:  Positive for abdominal pain. Negative for constipation, diarrhea, nausea and vomiting.  Genitourinary:  Negative for difficulty urinating, dysuria, vaginal bleeding and vaginal discharge.  Musculoskeletal:  Positive for back pain.   Physical Exam   Blood pressure 136/77, pulse 79, temperature 98.3 F (36.8 C), temperature source Oral, resp. rate 18, height 5' 5 (1.651 m), weight 117.9 kg, last menstrual period 05/07/2023, SpO2 98%.  Physical Exam Vitals and nursing note reviewed. Exam conducted with a chaperone present [facility], CHARITY FUNDRAISER).  Constitutional:      General: She is not in acute distress.    Appearance: She is well-developed. She is obese.  HENT:     Head: Normocephalic and atraumatic.  Eyes:     Conjunctiva/sclera: Conjunctivae normal.  Pulmonary:     Effort: Pulmonary effort is normal. No respiratory distress.  Abdominal:     Palpations: Abdomen is soft.     Tenderness: There is no abdominal tenderness. There is no right CVA tenderness or left CVA tenderness.  Musculoskeletal:        General: Normal range of motion.  Skin:    General: Skin is warm and dry.  Neurological:     Mental Status: She is alert and oriented to person, place, and time.  Psychiatric:        Mood and Affect: Mood normal.        Behavior: Behavior normal.     Fetal Assessment 125 bpm, Mod Var, -Decels, +10x10 and 15x15 Accels Toco: No ctx graphed  MAU Course   Results for orders placed or performed during the hospital encounter of 01/03/24 (from the past 24 hours)  Urinalysis, Routine w reflex microscopic -Urine, Clean Catch     Status: None   Collection Time: 01/03/24  2:47 AM  Result Value Ref Range   Color, Urine YELLOW YELLOW   APPearance CLEAR CLEAR   Specific Gravity, Urine 1.026 1.005 - 1.030  pH  6.0 5.0 - 8.0   Glucose, UA NEGATIVE NEGATIVE mg/dL   Hgb urine dipstick NEGATIVE NEGATIVE   Bilirubin Urine NEGATIVE NEGATIVE   Ketones, ur NEGATIVE NEGATIVE mg/dL   Protein, ur NEGATIVE NEGATIVE mg/dL   Nitrite NEGATIVE NEGATIVE   Leukocytes,Ua NEGATIVE NEGATIVE   US  MFM FETAL BPP W/NONSTRESS Result Date: 01/02/2024 ----------------------------------------------------------------------  OBSTETRICS REPORT                       (Signed Final 01/02/2024 02:19 pm) ---------------------------------------------------------------------- Patient Info  ID #:       [id number]                          D.O.B.:  2000-08-28 (23 yrs)(F)  Name:       DERRICKA MERTZ [facility]              Visit Date: 01/02/2024 10:12 am ---------------------------------------------------------------------- Performed By  Attending:        Delora Smaller DO       Ref. Address:     [address]                                                             [place], [place]                                                             [address]  Performed By:     Delora Smaller, DO      [facility]  Referred By:      [facility] ---------------------------------------------------------------------- Orders  #  Description                           Code  Ordered By  1  US  MFM UA CORD DOPPLER                76820.02    [facility]  2  US  MFM FETAL BPP                      23181.4     [facility]     W/NONSTRESS ----------------------------------------------------------------------  #  Order #                     Accession #                Episode #  1  [id number]                   [id number]                 [id number]  2  [id number]                   [id number]                 [id number]  ---------------------------------------------------------------------- Indications  [redacted] weeks gestation of pregnancy                Z3A.32  Poor fetal growth, third trimester             O36.5930  2 vessel umbilical cord                        O69.89X0  Obesity complicating pregnancy, second         O99.212  trimester (BMI 39.4)  Drug use complicating pregnancy, second        O99.322  trimester (Opioid use) ---------------------------------------------------------------------- Fetal Evaluation  Num Of Fetuses:         1  Fetal Heart Rate(bpm):  137  Cardiac Activity:       Observed  Presentation:           Cephalic  Placenta:               Posterior  P. Cord Insertion:      Previously seen  Amniotic Fluid  AFI FV:      Within normal limits  RUQ(cm)       RLQ(cm)       LUQ(cm)        LLQ(cm)  3.51          3.7           1.35           2.62 ---------------------------------------------------------------------- Biophysical Evaluation  Amniotic F.V:   Within normal limits       F. Tone:        Observed  F. Movement:    Observed                   N.S.T:          Reactive  F. Breathing:   Observed                   Score:          10/10 ---------------------------------------------------------------------- Biometry  LV:        5.3  mm ---------------------------------------------------------------------- OB History  Blood Type:   B+  Gravidity:    4          SAB:   3  Living:       0 ----------------------------------------------------------------------  Gestational Age  LMP:           34w 2d        Date:  05/07/23                  EDD:   02/11/24  Best:          bobbye 3d     Det. By:  U/S  (10/10/23)          EDD:   02/24/24 ---------------------------------------------------------------------- Doppler - Fetal Vessels  Umbilical Artery   S/D     %tile      RI    %tile      PI    %tile     PSV    ADFV    RDFV                                                     (cm/s)   3.75       94    0.73       92    1.22       94    49.25       No      No ---------------------------------------------------------------------- Comments  Sonographic findings  Single intrauterine pregnancy at 32w 3d.  Fetal cardiac activity:  Observed and appears normal.  Presentation: Cephalic.  Interval fetal anatomy appears normal.  Amniotic fluid: Within normal limits.  Placenta: Posterior.  Umbilical artery dopplers findings:  -S/D: 3.75 which are nearly elevated at this gestational age.  -Absent end-diastolic flow: No.  -Reversed end-diastolic flow: No.  BPP 10/10.  Recommendations  - See Epic note for assessment and plan of care. Any  referring office that does not utilize Epic will recieve a copy of  today's consult note via fax. Please contact our office with  any concerns.  There are limitations of prenatal ultrasound such as the  inability to detect certain abnormalities due to poor  visualization. Various factors such as fetal position,  gestational age and maternal body habitus may increase the  difficulty in visualizing the fetal anatomy. ----------------------------------------------------------------------                  Delora Smaller, DO Electronically Signed Final Report   01/02/2024 02:19 pm ----------------------------------------------------------------------   US  MFM UA CORD DOPPLER Result Date: 01/02/2024 ----------------------------------------------------------------------  OBSTETRICS REPORT                       (Signed Final 01/02/2024 02:19 pm) ---------------------------------------------------------------------- Patient Info  ID #:       [id number]                          D.O.B.:  06-May-2000 (23 yrs)(F)  Name:       Michelle Stephens [facility]              Visit Date: 01/02/2024 10:12 am ---------------------------------------------------------------------- Performed By  Attending:        Delora Smaller DO       Ref. Address:     [address]  [place], [place]                                                              [address]  Performed By:     Delora Smaller, DO      Location[facility]         [facility]  Referred By:      [facility] ---------------------------------------------------------------------- Orders  #  Description                           Code        Ordered By  1  US  MFM UA CORD DOPPLER                76820.02    [facility]  2  US  MFM FETAL BPP                      23181.4     [facility]     W/NONSTRESS ----------------------------------------------------------------------  #  Order #                     Accession #                Episode #  1  [id number]                   [id number]                 [id number]  2  [id number]                   [id number]                 [id number] ---------------------------------------------------------------------- Indications  [redacted] weeks gestation of pregnancy                Z3A.32  Poor fetal growth, third trimester             O36.5930  2 vessel umbilical cord  N30.10K9  Obesity complicating pregnancy, second         O99.212  trimester (BMI 39.4)  Drug use complicating pregnancy, second        O99.322  trimester (Opioid use) ---------------------------------------------------------------------- Fetal Evaluation  Num Of Fetuses:         1  Fetal Heart Rate(bpm):  137  Cardiac Activity:       Observed  Presentation:           Cephalic  Placenta:               Posterior  P. Cord Insertion:      Previously seen  Amniotic Fluid  AFI FV:      Within normal limits  RUQ(cm)       RLQ(cm)       LUQ(cm)        LLQ(cm)  3.51          3.7           1.35           2.62 ---------------------------------------------------------------------- Biophysical Evaluation  Amniotic F.V:   Within normal limits        F. Tone:        Observed  F. Movement:    Observed                   N.S.T:          Reactive  F. Breathing:   Observed                   Score:          10/10 ---------------------------------------------------------------------- Biometry  LV:        5.3  mm ---------------------------------------------------------------------- OB History  Blood Type:   B+  Gravidity:    4          SAB:   3  Living:       0 ---------------------------------------------------------------------- Gestational Age  LMP:           34w 2d        Date:  05/07/23                  EDD:   02/11/24  Best:          bobbye 3d     Det. By:  U/S  (10/10/23)          EDD:   02/24/24 ---------------------------------------------------------------------- Doppler - Fetal Vessels  Umbilical Artery   S/D     %tile      RI    %tile      PI    %tile     PSV    ADFV    RDFV                                                     (cm/s)   3.75       94    0.73       92    1.22       94    49.25      No      No ---------------------------------------------------------------------- Comments  Sonographic findings  Single intrauterine pregnancy at 32w 3d.  Fetal cardiac activity:  Observed and appears normal.  Presentation: Cephalic.  Interval fetal anatomy appears normal.  Amniotic fluid: Within  normal limits.  Placenta: Posterior.  Umbilical artery dopplers findings:  -S/D: 3.75 which are nearly elevated at this gestational age.  -Absent end-diastolic flow: No.  -Reversed end-diastolic flow: No.  BPP 10/10.  Recommendations  - See Epic note for assessment and plan of care. Any  referring office that does not utilize Epic will recieve a copy of  today's consult note via fax. Please contact our office with  any concerns.  There are limitations of prenatal ultrasound such as the  inability to detect certain abnormalities due to poor  visualization. Various factors such as fetal position,  gestational age and maternal body habitus may increase the  difficulty in  visualizing the fetal anatomy. ----------------------------------------------------------------------                  Delora Smaller, DO Electronically Signed Final Report   01/02/2024 02:19 pm ----------------------------------------------------------------------     MDM PE Labs: UA EFM EKG Assessment and Plan   23 year old G4P0030  SIUP at 32.4 weeks Cat I FT DFM SOB Abdominal Pain Back Pain  -POC reviewed while patient in triage. -Exam performed. -Patient offered and declines pain medication.  -Obtain EKG, which returns normal sinus rhythm. No consult obtained.  -Will monitor for fetal movement and reassess.   Harlene LITTIE Duncans MSN, CNM 01/03/2024, 2:44 AM   Reassessment (3:45 AM) -Patient reports increased fetal movement. -Reviewed EKG and informed that no consult obtained. -Discussed usage of maternity belt to promote increased comfort in abdominal area.  -Precautions reviewed. -Instructed to keep next appt as scheduled-Tuesday. -Encouraged to call primary office or return to MAU if symptoms worsen or with the onset of new symptoms. -Discharged to home in improved condition.  Harlene LITTIE Duncans MSN, CNM Advanced Practice Provider, [facility] [employer]

## 2024-01-03 NOTE — MAU Note (Signed)
 Billye R Malter is a 23 y.o. at [redacted]w[redacted]d here in MAU reporting: earlier had a lot of FM - heart started racing and fluttering - continued for an hour even after FM stopped. Also started having lower abdominal pain like round ligament pains, but maybe a little bit higher and left sided back pain kidney area. Took blood pressure at 0030 and it was 133/91. States the heart racing/fluttering has stopped. Tried laying down to go to sleep but started feeling SOB - feel like my lungs aren't getting to capacity. Has not felt FM since. Denies VB or LOF. Reports thinking she has had a UTI for 2 weeks - has urgency to urinate, but not much comes out. Taking suboxone . Reports smoking marijuana before coming to the hospital.   Onset of complaint: 2300 Pain score: 4 - abdomen; 7 - back  Vitals:   01/03/24 0234  BP: 136/77  Pulse: 79  Resp: 18  Temp: 98.3 F (36.8 C)  SpO2: 98%     FHT: 144  Lab orders placed from triage: UA  Emly, CNM at bedside during triage.

## 2024-01-06 ENCOUNTER — Telehealth: Payer: MEDICAID | Admitting: Family Medicine

## 2024-01-06 ENCOUNTER — Encounter: Payer: Self-pay | Admitting: Family Medicine

## 2024-01-06 ENCOUNTER — Ambulatory Visit: Payer: MEDICAID | Admitting: Clinical

## 2024-01-06 DIAGNOSIS — Z91199 Patient's noncompliance with other medical treatment and regimen due to unspecified reason: Secondary | ICD-10-CM

## 2024-01-06 DIAGNOSIS — O99323 Drug use complicating pregnancy, third trimester: Secondary | ICD-10-CM

## 2024-01-06 DIAGNOSIS — F1121 Opioid dependence, in remission: Secondary | ICD-10-CM | POA: Diagnosis not present

## 2024-01-06 DIAGNOSIS — O43193 Other malformation of placenta, third trimester: Secondary | ICD-10-CM

## 2024-01-06 DIAGNOSIS — Z3490 Encounter for supervision of normal pregnancy, unspecified, unspecified trimester: Secondary | ICD-10-CM

## 2024-01-06 DIAGNOSIS — Z59819 Housing instability, housed unspecified: Secondary | ICD-10-CM

## 2024-01-06 DIAGNOSIS — O365930 Maternal care for other known or suspected poor fetal growth, third trimester, not applicable or unspecified: Secondary | ICD-10-CM

## 2024-01-06 DIAGNOSIS — Z8619 Personal history of other infectious and parasitic diseases: Secondary | ICD-10-CM

## 2024-01-06 DIAGNOSIS — F332 Major depressive disorder, recurrent severe without psychotic features: Secondary | ICD-10-CM

## 2024-01-06 DIAGNOSIS — O0990 Supervision of high risk pregnancy, unspecified, unspecified trimester: Secondary | ICD-10-CM

## 2024-01-06 DIAGNOSIS — O09293 Supervision of pregnancy with other poor reproductive or obstetric history, third trimester: Secondary | ICD-10-CM | POA: Diagnosis not present

## 2024-01-06 DIAGNOSIS — O09899 Supervision of other high risk pregnancies, unspecified trimester: Secondary | ICD-10-CM

## 2024-01-06 DIAGNOSIS — Z3A33 33 weeks gestation of pregnancy: Secondary | ICD-10-CM

## 2024-01-06 DIAGNOSIS — O365990 Maternal care for other known or suspected poor fetal growth, unspecified trimester, not applicable or unspecified: Secondary | ICD-10-CM | POA: Insufficient documentation

## 2024-01-06 MED ORDER — SERTRALINE HCL 100 MG PO TABS: 150.0000 mg | ORAL_TABLET | Freq: Every day | ORAL | 3 refills | Status: DC

## 2024-01-06 NOTE — Patient Instructions (Addendum)
 Safe Medications in Pregnancy    Acne: Benzoyl Peroxide Salicylic Acid  Backache/Headache: Tylenol: 2 regular strength every 4 hours OR              2 Extra strength every 6 hours  Colds/Coughs/Allergies: Benadryl (alcohol free) 25 mg every 6 hours as needed Breath right strips Claritin Cepacol throat lozenges Chloraseptic throat spray Cold-Eeze- up to three times per day Cough drops, alcohol free Flonase (by prescription only) Guaifenesin Mucinex Robitussin DM (plain only, alcohol free) Saline nasal spray/drops Sudafed (pseudoephedrine) & Actifed ** use only after [redacted] weeks gestation and if you do not have high blood pressure Tylenol Vicks Vaporub Zinc lozenges Zyrtec   Constipation: Colace Ducolax suppositories Fleet enema Glycerin suppositories Metamucil Milk of magnesia Miralax Senokot Smooth move tea  Diarrhea: Kaopectate Imodium A-D  *NO pepto Bismol  Hemorrhoids: Anusol Anusol HC Preparation H Tucks  Indigestion: Tums Maalox Mylanta Zantac  Pepcid  Insomnia: Benadryl (alcohol free) 25mg  every 6 hours as needed Tylenol PM Unisom, no Gelcaps  Leg Cramps: Tums MagGel  Nausea/Vomiting:  Bonine Dramamine Emetrol Ginger extract Sea bands Meclizine  Nausea medication to take during pregnancy:  Unisom (doxylamine succinate 25 mg tablets) Take one tablet daily at bedtime. If symptoms are not adequately controlled, the dose can be increased to a maximum recommended dose of two tablets daily (1/2 tablet in the morning, 1/2 tablet mid-afternoon and one at bedtime). Vitamin B6 100mg  tablets. Take one tablet twice a day (up to 200 mg per day).  Skin Rashes: Aveeno products Benadryl cream or 25mg  every 6 hours as needed Calamine Lotion 1% cortisone cream  Yeast infection: Gyne-lotrimin 7 Monistat 7   **If taking multiple medications, please check labels to avoid duplicating the same active ingredients **take  medication as directed on the label ** Do not exceed 4000 mg of tylenol in 24 hours **Do not take medications that contain aspirin or ibuprofen     Birth Control Options Birth control is also called contraception. Birth control prevents pregnancy. There are many types of birth control. Work with your health care provider to find the best option for you. Birth control that uses hormones These types of birth control have hormones in them to prevent pregnancy. Birth control implant This is a small tube that is put into the skin of your arm. The tube can stay in for up to 3 years. Birth control shot These are shots you get every 3 months. Birth control pills This is a pill you take every day. You need to take it at the same time each day. Birth control patch This is a patch that you put on your skin. You change it 1 time each week for 3 weeks. After that, you take the patch off for 1 week. Vaginal ring  This is a soft plastic ring that you put in your vagina. The ring is left in for 3 weeks. Then, you take it out for 1 week. Then, you put a new ring in. Barrier methods  Female condom This is a thin covering that you put on the penis before sex. The condom is thrown away after sex. Female condom This is a soft, loose covering that you put in the vagina before sex. The condom is thrown away after sex. Diaphragm A diaphragm is a soft barrier that is shaped like a bowl. It must be made to fit your body. You put it in the vagina before sex with a chemical that kills sperm called spermicide. A  diaphragm should be left in the vagina for 6-8 hours after sex and taken out within 24 hours. You need to replace a diaphragm: Every 1-2 years. After giving birth. After gaining more than 15 lb (6.8 kg). If you have surgery on your pelvis. Cervical cap This is a small, soft cup that fits over the cervix. The cervix is the lowest part of the uterus. It's put in the vagina before sex, along with  spermicide. The cap must be made for you. The cap should be left in for 6-8 hours after sex. It is taken out within 48 hours. A cervical cap must be prescribed and fit to your body by a provider. It should be replaced every 2 years. Sponge This is a small sponge that is put into the vagina before sex. It must be left in for at least 6 hours after sex. It must be taken out within 30 hours and thrown away. Spermicides These are chemicals that kill or stop sperm from getting into the uterus. They may be a pill, cream, jelly, or foam that you put into your vagina. They should be used at least 10-15 minutes before sex. Intrauterine device An intrauterine device (IUD) is a device that's put in the uterus by a provider. There are two types: Hormone IUD. This kind can stay in for 3-5 years. Copper IUD. This kind can stay in for 10 years. Permanent birth control Female tubal ligation This is surgery to block the fallopian tubes. Female sterilization This is a surgery, called a vasectomy, to tie off the tubes that carry sperm in men. This method takes 3 months to work. Other forms of birth control must be used for 3 months. Natural planning methods This means not having sex on the days the female partner could get pregnant. Here are some types of natural planning birth control: Using a calendar: To keep track of the length of each menstrual cycle. To find out what days pregnancy can happen. To plan to not have sex on days when pregnancy can happen. Watching for signs of ovulation and not having sex during this time. The female partner can check for ovulation by keeping track of their temperature each day. They can also look for changes in the mucus that comes from the cervix. Where to find more information Centers for Disease Control and Prevention: TonerPromos.no. Then: Enter "birth control" in the search box. This information is not intended to replace advice given to you by your health care provider. Make  sure you discuss any questions you have with your health care provider. Document Revised: 11/14/2022 Document Reviewed: 04/09/2022 Elsevier Patient Education  2024 [employer].

## 2024-01-06 NOTE — Progress Notes (Signed)
 I connected with Michelle Stephens 01/06/24 at  3:55 PM EST by: MyChart video and verified that I am speaking with the correct person using two identifiers.  Patient is located at home and provider is located at [facility].     I discussed the limitations, risks, security and privacy concerns of performing an evaluation and management service by MyChart video and the availability of in person appointments. I also discussed with the patient that there may be a patient responsible charge related to this service. By engaging in this virtual visit, you consent to the provision of healthcare.  Additionally, you authorize for your insurance to be billed for the services provided during this visit.  The patient expressed understanding and agreed to proceed.  The following staff members participated in the virtual visit:   Donnice CHRISTELLA Carolus, MD, MPH, FAAFP Attending Family Medicine Physician, Faculty Practice Center for [facility], [facility]     PRENATAL VISIT NOTE  Subjective:  Michelle Stephens is a 23 y.o. G4P0030 at [redacted]w[redacted]d  for virtual visit for ongoing prenatal care.  She is currently monitored for the following issues for this high-risk pregnancy and has MDD (major depressive disorder), recurrent episode, severe; PTSD (post-traumatic stress disorder); ADHD (attention deficit hyperactivity disorder), combined type; Self-inflicted injury; Supervision of high risk pregnancy, antepartum; Opioid use disorder, severe, in early remission (HCC); Substance abuse affecting pregnancy, antepartum (HCC); History of syphilis; Rubella non-immune status, antepartum; Housing insecurity; JIA (juvenile idiopathic arthritis), oligoarthritis, persistent (HCC); Single umbilical artery affecting management of mother in singleton pregnancy, antepartum; Suboxone  maintenance treatment complicating pregnancy, antepartum (HCC); and IUGR (intrauterine growth restriction) affecting care of mother on  their problem list.  Patient reports feeling like she has a cold.  Contractions: Not present.  .  Movement: Present. Denies leaking of fluid.   The following portions of the patient's history were reviewed and updated as appropriate: allergies, current medications, past family history, past medical history, past social history, past surgical history and problem list.   Objective:  There were no vitals filed for this visit. Self-Obtained  Fetal Status:     Movement: Present         Assessment and Plan:  Pregnancy: G4P0030 at [redacted]w[redacted]d 1. Supervision of high risk pregnancy, antepartum Fetal movement is normal Feels like she has a cold, looks like it too, given list of safe meds in pregnancy  2. Opioid use disorder, severe, in early remission (HCC) Stable on suboxone  8 QID UDS next visit when she is in person  3. History of syphilis S/p bicillin x3  4. Housing insecurity Still living at [facility], starting to get worried as delivery may be as soon Still trying to figure out alternative living situation Asked about possibility of paying rent again through [place] fund if she is still living there, reviewed $1k limit but that it should be possible if necessary  5. Rubella non-immune status, antepartum Offer MMR PP  6. Severe episode of recurrent major depressive disorder, without psychotic features (HCC) Seeing [name] Zoloft increased to 100 mg at bedtime last visit, feels like she is getting better sleep but anxiety wise is not sure she sees any difference Increased zoloft to 150 mg at bedtime   7. Single umbilical artery affecting management of mother in singleton pregnancy, antepartum Low risk NIPT, normal fetal echo at [facility] on 11/20/2023 Following w MFM  8. Poor fetal growth affecting management of mother in third trimester, single or unspecified fetus Last  growth US  12/24/2023, EFW 4.4%, 1395g, AC 4.1%, AFI 11, normal UAD Weekly testing with MFM has been  reassuring Next growth US  in about 1-2 weeks, delivery timing pending that scan Discussed in detail, indication for early delivery pending severity of FGR, etc.    Preterm labor symptoms and general obstetric precautions including but not limited to vaginal bleeding, contractions, leaking of fluid and fetal movement were reviewed in detail with the patient.  Return in 2 weeks (on 01/20/2024) for REACH clinic, ob visit.  Future Appointments  Date Time Provider Department Center  01/06/2024  3:55 PM Lola Donnice HERO, MD [facility]  01/08/2024  1:45 PM [facility] PROVIDER 1 [facility]  01/08/2024  2:00 PM [facility] US5 [facility]  01/08/2024  3:15 PM [facility] NST [facility]     Time spent on virtual visit: 20 minutes  Donnice HERO Lola, MD

## 2024-01-06 NOTE — BH Specialist Note (Signed)
 Pt did not arrive to video visit and did not answer the phone; Left HIPPA-compliant message to call back Warren from [employer] for [employer] at [facility] at  [phone] [facility] office).  ?; left MyChart message for patient.  ? ?

## 2024-01-08 ENCOUNTER — Ambulatory Visit: Payer: MEDICAID

## 2024-01-08 ENCOUNTER — Other Ambulatory Visit (HOSPITAL_BASED_OUTPATIENT_CLINIC_OR_DEPARTMENT_OTHER): Payer: MEDICAID

## 2024-01-08 ENCOUNTER — Other Ambulatory Visit: Payer: MEDICAID

## 2024-01-08 NOTE — Progress Notes (Deleted)
 Pt did not show for today's visit. We will attempt to reschedule her.

## 2024-01-19 ENCOUNTER — Ambulatory Visit: Payer: MEDICAID

## 2024-01-20 ENCOUNTER — Ambulatory Visit (INDEPENDENT_AMBULATORY_CARE_PROVIDER_SITE_OTHER): Payer: MEDICAID | Admitting: Advanced Practice Midwife

## 2024-01-20 ENCOUNTER — Encounter: Payer: Self-pay | Admitting: Advanced Practice Midwife

## 2024-01-20 VITALS — BP 109/75 | HR 74 | Wt 265.4 lb

## 2024-01-20 DIAGNOSIS — O09899 Supervision of other high risk pregnancies, unspecified trimester: Secondary | ICD-10-CM

## 2024-01-20 DIAGNOSIS — Z2839 Other underimmunization status: Secondary | ICD-10-CM | POA: Diagnosis not present

## 2024-01-20 DIAGNOSIS — F1910 Other psychoactive substance abuse, uncomplicated: Secondary | ICD-10-CM

## 2024-01-20 DIAGNOSIS — F5105 Insomnia due to other mental disorder: Secondary | ICD-10-CM

## 2024-01-20 DIAGNOSIS — O09893 Supervision of other high risk pregnancies, third trimester: Secondary | ICD-10-CM | POA: Diagnosis not present

## 2024-01-20 DIAGNOSIS — O365930 Maternal care for other known or suspected poor fetal growth, third trimester, not applicable or unspecified: Secondary | ICD-10-CM

## 2024-01-20 DIAGNOSIS — O0993 Supervision of high risk pregnancy, unspecified, third trimester: Secondary | ICD-10-CM

## 2024-01-20 DIAGNOSIS — F332 Major depressive disorder, recurrent severe without psychotic features: Secondary | ICD-10-CM | POA: Diagnosis not present

## 2024-01-20 DIAGNOSIS — F99 Mental disorder, not otherwise specified: Secondary | ICD-10-CM | POA: Diagnosis not present

## 2024-01-20 DIAGNOSIS — O99322 Drug use complicating pregnancy, second trimester: Secondary | ICD-10-CM | POA: Diagnosis not present

## 2024-01-20 DIAGNOSIS — O0990 Supervision of high risk pregnancy, unspecified, unspecified trimester: Secondary | ICD-10-CM

## 2024-01-20 DIAGNOSIS — F1120 Opioid dependence, uncomplicated: Secondary | ICD-10-CM | POA: Diagnosis not present

## 2024-01-20 DIAGNOSIS — Z3A35 35 weeks gestation of pregnancy: Secondary | ICD-10-CM | POA: Diagnosis not present

## 2024-01-20 DIAGNOSIS — Z3490 Encounter for supervision of normal pregnancy, unspecified, unspecified trimester: Secondary | ICD-10-CM

## 2024-01-20 DIAGNOSIS — O98113 Syphilis complicating pregnancy, third trimester: Secondary | ICD-10-CM | POA: Diagnosis not present

## 2024-01-20 MED ORDER — BUPRENORPHINE HCL-NALOXONE HCL 8-2 MG SL FILM: 1.0000 | ORAL_FILM | Freq: Four times a day (QID) | SUBLINGUAL | 0 refills | Status: DC

## 2024-01-20 MED ORDER — HYDROXYZINE HCL 25 MG PO TABS: 25.0000 mg | ORAL_TABLET | Freq: Four times a day (QID) | ORAL | 0 refills | Status: DC | PRN

## 2024-01-20 MED ORDER — ZOLPIDEM TARTRATE 10 MG PO TABS
10.0000 mg | ORAL_TABLET | Freq: Every day | ORAL | 1 refills | Status: AC
Start: 2024-01-20 — End: 2024-02-19

## 2024-01-20 NOTE — Progress Notes (Signed)
 Subjective:   Michelle Stephens is a 23 y.o. G4P0030 here today for ongoing prenatal care, substance use disorder management and substance exposed newborn preparation.  Michelle Stephens reports feeling well and preparing for her baby's birth.  Health Maintenance Due  Topic Date Due   HPV VACCINES (1 - 3-dose series) Never done   Hepatitis B Vaccines 19-59 Average Risk (1 of 3 - 19+ 3-dose series) Never done   Influenza Vaccine  Never done   COVID-19 Vaccine (1 - 2025-26 season) Never done    Past Medical History:  Diagnosis Date   ADHD (attention deficit hyperactivity disorder)    Allergy    seasonal   Anxiety    Asthma    when young   Depression    Eczema    GERD (gastroesophageal reflux disease)    Headache(784.0)    Heart murmur    when a baby   JRA (juvenile rheumatoid arthritis) (HCC)    Obesity    Urinary tract infection    Vision abnormalities     Past Surgical History:  Procedure Laterality Date   WISDOM TOOTH EXTRACTION  2019    The following portions of the patient's history were reviewed and updated as appropriate: allergies, current medications, past family history, past medical history, past social history, past surgical history and problem list.     Objective:   Michelle Stephens is well appearing with clear thoughts and communication.  She continues to prepare for her baby's birth and is gathering needed baby supplies.     Assessment and Plan:  We reviewed the expected management for Michelle Stephens's baby, Michelle Stephens following delivery and her 5 day observation period.  She identifies her sister and partner, Michelle Stephens as support people during labor, delivery and newborn observation period. She has enrolled in [facility] and has most of her needed baby supplies other than a sleep space.  She is aware of [employer] program when she delivers if she is unable to obtain a  sleep space.    We continued out conversation regarding possible CPS involvement and the spectrum of  possibilities.  We discussed the possibility of considering a temporary safety provider, who she would identify as her sister or her sister's mother.  She will be having a repeat RPR today and we discussed those possible results and the range of newborn evaluation her baby could have following deliver from labs to potential treatment.  Michelle Stephens has the [facility] consult contact for needs that arise prior to her next visit. Problem List Items Addressed This Visit       Cardiovascular and Mediastinum   Single umbilical artery affecting management of mother in singleton pregnancy, antepartum - Primary     Other   Supervision of high risk pregnancy, antepartum   Substance abuse affecting pregnancy, antepartum (HCC)   Relevant Orders   ToxAssure Flex 15, Ur   Syphilis affecting pregnancy   Relevant Orders   RPR   Rubella non-immune status, antepartum   Suboxone  maintenance treatment complicating pregnancy, antepartum (HCC)   Relevant Orders   ToxAssure Flex 15, Ur   IUGR (intrauterine growth restriction) affecting care of mother   Other Visit Diagnoses       [redacted] weeks gestation of pregnancy           Routine preventative health maintenance measures emphasized. Please refer to After Visit Summary for other counseling recommendations.   No follow-ups on file.    Total face-to-face time with patient: 20 minutes.  Over 50%  of encounter was spent on counseling and coordination of care.   Michelle Stephens, NNP-BC Neonatal Nurse Practitioner Substance Exposed Newborn Consult at the [facility] [phone]

## 2024-01-20 NOTE — Progress Notes (Signed)
 Subjective:  Michelle Stephens is a 23 y.o. G4P0030 at [redacted]w[redacted]d being seen today for ongoing prenatal care.  She is currently monitored for the following issues for this high-risk pregnancy and has MDD (major depressive disorder), recurrent episode, severe; PTSD (post-traumatic stress disorder); ADHD (attention deficit hyperactivity disorder), combined type; Self-inflicted injury; Supervision of high risk pregnancy, antepartum; Opioid use disorder, severe, in early remission (HCC); Substance abuse affecting pregnancy, antepartum (HCC); Syphilis affecting pregnancy; Rubella non-immune status, antepartum; Housing insecurity; JIA (juvenile idiopathic arthritis), oligoarthritis, persistent (HCC); Single umbilical artery affecting management of mother in singleton pregnancy, antepartum; Suboxone  maintenance treatment complicating pregnancy, antepartum (HCC); and IUGR (intrauterine growth restriction) affecting care of mother on their problem list.  Patient reports no complaints.  Contractions: Not present. Vag. Bleeding: None.  Movement: Present. Denies leaking of fluid.   Denies withdrawal Sx or cravings on current dose of Suboxone  8-2 QID. Is about to run out of Zoloft . Dr. Lola increased her dose to 150 mg QD on 01/06/24 and sent new Rx but pt states she was told that she cannot fill new Rx until 01/23/24   The following portions of the patient's history were reviewed and updated as appropriate: allergies, current medications, past family history, past medical history, past social history, past surgical history and problem list. Problem list updated.  Objective:   Vitals:   01/20/24 1650  BP: 109/75  Pulse: 74  Weight: 265 lb 6.4 oz (120.4 kg)    Fetal Status: Fetal Heart Rate (bpm): 150   Movement: Present     General:  Alert, oriented and cooperative. Patient is in no acute distress.  Skin: Skin is warm and dry. No rash noted.   Cardiovascular: Normal heart rate noted  Respiratory: Normal  respiratory effort, no problems with respiration noted  Abdomen: Soft, gravid, appropriate for gestational age. Pain/Pressure: Absent     Pelvic: Vag. Bleeding: None     Cervical exam deferred        Extremities: Normal range of motion.  Edema: Trace  Mental Status: Normal mood and affect. Normal behavior. Normal judgment and thought content.   Urinalysis:      PDMP reviewed during this encounter.   Last UDS: Lab Results  Component Value Date   CREATIUR 162 12/09/2023      US  12/24/23: Est. FW: 1395 gm 3 lb 1 oz 4.4 %   Assessment and Plan:  Pregnancy: G4P0030 at [redacted]w[redacted]d  1. Single umbilical artery affecting management of mother in singleton pregnancy, antepartum (Primary)  2. Supervision of high risk pregnancy, antepartum - Respiratory syncytial virus vaccine, preF, subunit, bivalent,(Abrysvo)  3. Substance abuse affecting pregnancy, antepartum (HCC) - ToxAssure Flex 15, Ur  4. Suboxone  maintenance treatment complicating pregnancy, antepartum (HCC) - ToxAssure Flex 15, Ur  5. Rubella non-immune status, antepartum  6. Poor fetal growth affecting management of mother in third trimester, single or unspecified fetus - Antenatal testing per MFM  7. Syphilis affecting pregnancy in third trimester - S/P Bicillin x 3 - RPR  8. [redacted] weeks gestation of pregnancy  9. Severe episode of recurrent major depressive disorder, without psychotic features (HCC) - hydrOXYzine  (ATARAX ) 25 MG tablet; Take 1 tablet (25 mg total) by mouth every 6 (six) hours as needed for anxiety.  Dispense: 30 tablet; Refill: 0  10. Suboxone  maintenance treatment complicating pregnancy, antepartum, second trimester (HCC) - Buprenorphine  HCl-Naloxone  HCl (SUBOXONE ) 8-2 MG FILM; Place 1 Film under the tongue 4 (four) times daily.  Dispense: 120 Film; Refill: 0  11. Insomnia due to other mental disorder - zolpidem  (AMBIEN ) 10 MG tablet; Take 1 tablet (10 mg total) by mouth at bedtime.  Dispense: 15 tablet;  Refill: 1   Preterm labor symptoms and general obstetric precautions including but not limited to vaginal bleeding, contractions, leaking of fluid and fetal movement were reviewed in detail with the patient. Please refer to After Visit Summary for other counseling recommendations.   Return in about 1 week (around 01/27/2024) for REACH ROB.   No future appointments.   Total face-to-face time with patient: 15 minutes.  Over 50% of encounter was spent on counseling and coordination of care.   Warnell Rasnic  Claudene HOWARD 01/20/2024 5:17 PM Center for [place], [facility]

## 2024-01-21 ENCOUNTER — Telehealth: Payer: Self-pay | Admitting: Advanced Practice Midwife

## 2024-01-21 LAB — RPR, QUANT+TP ABS (REFLEX)
Rapid Plasma Reagin, Quant: 1:2 {titer} — ABNORMAL HIGH
T Pallidum Abs: REACTIVE — AB

## 2024-01-21 LAB — SYPHILIS: RPR W/REFLEX TO RPR TITER AND TREPONEMAL ANTIBODIES, TRADITIONAL SCREENING AND DIAGNOSIS ALGORITHM: RPR Ser Ql: REACTIVE — AB

## 2024-01-21 NOTE — Telephone Encounter (Signed)
 Called MyPharmacy to find out why pt was not able to fill Zoloft  Prescription since dose was increased to 150 mg on 01/06/24. Per Garen, Pt's insurance will npt cover it until 01/23/24. Pharmacy can do a partial fill on 6 day-supply for $4 and can send it out today so that pt doesn't miss her dose tomorrow.. Pt informed and expressed gratitude.   Michelle Stephens  Claudene HOWARD 01/21/2024 4:05 PM

## 2024-01-26 LAB — TOXASSURE FLEX 15, UR
6-ACETYLMORPHINE IA: NEGATIVE ng/mL
7-aminoclonazepam: NOT DETECTED ng/mg{creat}
AMPHETAMINES IA: NEGATIVE ng/mL
Alpha-hydroxyalprazolam: NOT DETECTED ng/mg{creat}
Alpha-hydroxymidazolam: NOT DETECTED ng/mg{creat}
Alpha-hydroxytriazolam: NOT DETECTED ng/mg{creat}
Alprazolam: NOT DETECTED ng/mg{creat}
BARBITURATES IA: NEGATIVE ng/mL
BUPRENORPHINE: POSITIVE
Benzodiazepines: NEGATIVE
Buprenorphine: 175 ng/mg{creat}
COCAINE METABOLITE IA: NEGATIVE ng/mL
Clonazepam: NOT DETECTED ng/mg{creat}
Creatinine: 188 mg/dL (ref >=20)
Desalkylflurazepam: NOT DETECTED ng/mg{creat}
Desmethyldiazepam: NOT DETECTED ng/mg{creat}
Desmethylflunitrazepam: NOT DETECTED ng/mg{creat}
Diazepam: NOT DETECTED ng/mg{creat}
ETHYL ALCOHOL Enzymatic: NEGATIVE g/dL
FENTANYL: NEGATIVE
Fentanyl: NOT DETECTED ng/mg{creat}
Flunitrazepam: NOT DETECTED ng/mg{creat}
Lorazepam: NOT DETECTED ng/mg{creat}
METHADONE IA: NEGATIVE ng/mL
METHADONE MTB IA: NEGATIVE ng/mL
Midazolam: NOT DETECTED ng/mg{creat}
Norbuprenorphine: >532 ng/mg{creat}
Norfentanyl: NOT DETECTED ng/mg{creat}
OPIATE CLASS IA: NEGATIVE ng/mL
OXYCODONE CLASS IA: NEGATIVE ng/mL
Oxazepam: NOT DETECTED ng/mg{creat}
PHENCYCLIDINE IA: NEGATIVE ng/mL
TAPENTADOL, IA: NEGATIVE ng/mL
TRAMADOL IA: NEGATIVE ng/mL
Temazepam: NOT DETECTED ng/mg{creat}

## 2024-01-26 LAB — CANNABINOIDS, MS, UR RFX
Cannabinoids Confirmation: POSITIVE
Carboxy-THC: >532 ng/mg{creat}

## 2024-01-27 ENCOUNTER — Ambulatory Visit: Payer: MEDICAID | Admitting: Advanced Practice Midwife

## 2024-01-27 ENCOUNTER — Ambulatory Visit: Payer: MEDICAID

## 2024-01-27 ENCOUNTER — Other Ambulatory Visit (HOSPITAL_COMMUNITY)
Admission: RE | Admit: 2024-01-27 | Discharge: 2024-01-27 | Disposition: A | Discharge status: Home / Self Care | Payer: MEDICAID | Source: Ambulatory Visit | Attending: Family Medicine | Admitting: Family Medicine

## 2024-01-27 ENCOUNTER — Other Ambulatory Visit: Payer: Self-pay

## 2024-01-27 VITALS — BP 114/79 | HR 91 | Wt 264.8 lb

## 2024-01-27 DIAGNOSIS — O0990 Supervision of high risk pregnancy, unspecified, unspecified trimester: Secondary | ICD-10-CM | POA: Diagnosis present

## 2024-01-27 DIAGNOSIS — Z3A36 36 weeks gestation of pregnancy: Secondary | ICD-10-CM

## 2024-01-27 DIAGNOSIS — O09899 Supervision of other high risk pregnancies, unspecified trimester: Secondary | ICD-10-CM

## 2024-01-27 DIAGNOSIS — O98113 Syphilis complicating pregnancy, third trimester: Secondary | ICD-10-CM

## 2024-01-27 DIAGNOSIS — O99320 Drug use complicating pregnancy, unspecified trimester: Secondary | ICD-10-CM

## 2024-01-27 DIAGNOSIS — F332 Major depressive disorder, recurrent severe without psychotic features: Secondary | ICD-10-CM

## 2024-01-27 DIAGNOSIS — Z2839 Other underimmunization status: Secondary | ICD-10-CM

## 2024-01-27 DIAGNOSIS — F1120 Opioid dependence, uncomplicated: Secondary | ICD-10-CM

## 2024-01-27 DIAGNOSIS — O365930 Maternal care for other known or suspected poor fetal growth, third trimester, not applicable or unspecified: Secondary | ICD-10-CM

## 2024-01-27 MED ORDER — BUPRENORPHINE HCL-NALOXONE HCL 8-2 MG SL FILM: 1.0000 | ORAL_FILM | Freq: Four times a day (QID) | SUBLINGUAL | 0 refills | Status: AC

## 2024-01-27 NOTE — Progress Notes (Addendum)
 Subjective:  Michelle Stephens is a 23 y.o. G4P0030 at [redacted]w[redacted]d being seen today for ongoing prenatal care.  She is currently monitored for the following issues for this high-risk pregnancy and has MDD (major depressive disorder), recurrent episode, severe; PTSD (post-traumatic stress disorder); ADHD (attention deficit hyperactivity disorder), combined type; Self-inflicted injury; Supervision of high risk pregnancy, antepartum; Opioid use disorder, severe, in early remission (HCC); Substance abuse affecting pregnancy, antepartum (HCC); Syphilis affecting pregnancy; Rubella non-immune status, antepartum; Housing insecurity; JIA (juvenile idiopathic arthritis), oligoarthritis, persistent (HCC); Single umbilical artery affecting management of mother in singleton pregnancy, antepartum; Suboxone  maintenance treatment complicating pregnancy, antepartum (HCC); and IUGR (intrauterine growth restriction) affecting care of mother on their problem list.  Patient reports no complaints.  Contractions: Not present. Vag. Bleeding: None.  Movement: Present. Denies leaking of fluid.   Pt denies withdrawal Sx or cravings on current dose of Suboxone  8-2 mg QID.   Missed MFM dopplers, growth US , BPP.   The following portions of the patient's history were reviewed and updated as appropriate: allergies, current medications, past family history, past medical history, past social history, past surgical history and problem list. Problem list updated.  Objective:   Vitals:   01/27/24 1541  BP: 114/79  Pulse: 91  Weight: 264 lb 12.8 oz (120.1 kg)    Fetal Status: Fetal Heart Rate (bpm): 150   Movement: Present  Presentation: Vertex  General:  Alert, oriented and cooperative. Patient is in no acute distress.  Skin: Skin is warm and dry. No rash noted.   Cardiovascular: Normal heart rate noted  Respiratory: Normal respiratory effort, no problems with respiration noted  Abdomen: Soft, gravid, appropriate for  gestational age. Pain/Pressure: Present     Pelvic: Vag. Bleeding: None     Cervical exam deferred       Self swabs collected  Extremities: Normal range of motion.  Edema: Trace  Mental Status: Normal mood and affect. Normal behavior. Normal judgment and thought content.   Urinalysis:      PDMP reviewed during this encounter.   Last UDS: Lab Results  Component Value Date   CREATIUR 188 01/21/2024   07/17/23: RPR reactive. Titer 1:2 Bicillin IM: 5/22, 5/29, 6/5. Records in media tab.  Latest Reference Range & Units 11/14/23 16:30 11/28/23 11:01 01/20/24 16:57  Rapid Plasma Reagin, Quant NonRea<1:1 titer  1:1 (H) 1:2 (H)  T Pallidum Abs Non Reactive  Reactive ! Reactive ! Reactive !  RPR Non Reactive  Reactive ! Reactive ! Reactive !  RPR Titer  1:1    (H): Data is abnormally high !: Data is abnormal    AFI 12  I reviewed the NST EFM: Baseline: 150 bpm, Variability: Good {> 6 bpm), Accelerations: 10x10 and 15x15 after 50 minutes , and Decelerations: Absent Toco: none  Nikkolas Coomes , CNM 01/27/2024 5:50 PM   Assessment and Plan:  Pregnancy: G4P0030 at [redacted]w[redacted]d  1. Single umbilical artery affecting management of mother in singleton pregnancy, antepartum (Primary)  2. Syphilis affecting pregnancy in third trimester - RPR titer increased from 1:1 to 1:2 after adequate treatment. Does not meet criteria for filed Tx or reinfection, but is concerning for the possibility of either. Per Dr. Lola will check RPR weekly until delivery and repeat Bicillin if 3-4 fold increase.   - RPR - Will inform peds at delivery.   3. Supervision of high risk pregnancy, antepartum - GC/chlamydia probe amp, genital - Culture, beta strep (group b only) - GC/Chlamydia probe amp ([place])not  at [facility]  4. Substance abuse affecting pregnancy, antepartum (HCC) - ToxAssure Flex 15, Ur  5. Suboxone  maintenance treatment complicating pregnancy, antepartum (HCC) - ToxAssure Flex 15, Ur  6.  Rubella non-immune status, antepartum  7. Severe episode of recurrent major depressive disorder, without psychotic features (HCC) - ToxAssure Flex 15, Ur  8. Poor fetal growth affecting management of mother in third trimester, single or unspecified fetus - Fetal nonstress test; Future NST non-reactive after 50 minutes. Fetus active. Reviewed with Dr. Lola. Recommend going to MAU now for BPP Dopplers and growth US . MAU staff notified.   9. [redacted] weeks gestation of pregnancy - GC/chlamydia probe amp, genital - Culture, beta strep (group b only) - GC/Chlamydia probe amp ([place])not at [facility]   Preterm labor symptoms and general obstetric precautions including but not limited to vaginal bleeding, contractions, leaking of fluid and fetal movement were reviewed in detail with the patient. Please refer to After Visit Summary for other counseling recommendations.   No follow-ups on file.   Future Appointments  Date Time Provider Department Center  02/03/2024  8:15 AM [facility] PROVIDER 1 [facility]  02/03/2024  8:30 AM [facility] US6 [facility]  02/03/2024  9:45 AM [facility] NST [facility]  02/03/2024  3:55 PM Claudene Ovens , CNM [facility]  02/10/2024  3:15 PM Lola Donnice HERO, MD [facility]  02/17/2024  2:35 PM Lola Donnice HERO, MD [facility]  02/24/2024  1:15 PM Claudene, Kariya Lavergne , CNM [facility]    Total face-to-face time with patient: 30 minutes.  Over 50% of encounter was spent on counseling and coordination of care.   Nyla Creason  Claudene HOWARD 01/27/2024 5:37 PM Center for [employer] Faculty Practice, [facility]

## 2024-01-27 NOTE — Patient Instructions (Signed)
 [email or web address] [address] [place], [place] [address] [phone]

## 2024-01-27 NOTE — Progress Notes (Signed)
   Subjective:   Michelle Stephens is a 23 y.o. G4P0030 here today for ongoing substance exposed newborn consult.   Health Maintenance Due  Topic Date Due   HPV VACCINES (1 - 3-dose series) Never done   Hepatitis B Vaccines 19-59 Average Risk (1 of 3 - 19+ 3-dose series) Never done   Influenza Vaccine  Never done   COVID-19 Vaccine (1 - 2025-26 season) Never done    Past Medical History:  Diagnosis Date   ADHD (attention deficit hyperactivity disorder)    Allergy    seasonal   Anxiety    Asthma    when young   Depression    Eczema    GERD (gastroesophageal reflux disease)    Headache(784.0)    Heart murmur    when a baby   JRA (juvenile rheumatoid arthritis) (HCC)    Obesity    Urinary tract infection    Vision abnormalities     Past Surgical History:  Procedure Laterality Date   WISDOM TOOTH EXTRACTION  2019    The following portions of the patient's history were reviewed and updated as appropriate: allergies, current medications, past family history, past medical history, past social history, past surgical history and problem list.     Objective:   [name] is well appearing in no acute distress. She has linear thinking and clear communication.      Assessment and Plan:  Met with Graycen and her support partner today at [facility] for ongoing substance exposed newborn consult. We discussed her ongoing prenatal care. Continued the discussion plans following delivery, including observation period, potential CPS referral and Valetta's feelings regarding these things. Celebrated Aubrina's continued sobriety and encouraged her to begin preparing for infant's arrival (stable housing and packing hospital bag). Leydy stated that housing is her greatest stressor at present.   Encouraged [name] to contact [facility] consult phone in between appointments with any further questions or concerns and if she goes into labor.     Problem List Items Addressed This Visit    None   Routine preventative health maintenance measures emphasized. Please refer to After Visit Summary for other counseling recommendations.   No follow-ups on file.    Total face-to-face time with patient: 30 minutes.  Over 50% of encounter was spent on counseling and coordination of care.   Comer Fang, NNP-BC Neonatal Nurse Practitioner Substance Exposed Newborn Consult at the [facility] [phone]

## 2024-01-28 LAB — SYPHILIS: RPR W/REFLEX TO RPR TITER AND TREPONEMAL ANTIBODIES, TRADITIONAL SCREENING AND DIAGNOSIS ALGORITHM: RPR Ser Ql: REACTIVE — AB

## 2024-01-28 LAB — RPR, QUANT+TP ABS (REFLEX)
Rapid Plasma Reagin, Quant: 1:1 {titer} — ABNORMAL HIGH
T Pallidum Abs: REACTIVE — AB

## 2024-01-29 LAB — GC/CHLAMYDIA PROBE AMP (~~LOC~~) NOT AT ARMC
Chlamydia: NEGATIVE
Comment: NEGATIVE
Comment: NORMAL
Neisseria Gonorrhea: NEGATIVE

## 2024-01-30 LAB — TOXASSURE FLEX 15, UR
6-ACETYLMORPHINE IA: NEGATIVE ng/mL
7-aminoclonazepam: NOT DETECTED ng/mg{creat}
AMPHETAMINES IA: NEGATIVE ng/mL
Alpha-hydroxyalprazolam: NOT DETECTED ng/mg{creat}
Alpha-hydroxymidazolam: NOT DETECTED ng/mg{creat}
Alpha-hydroxytriazolam: NOT DETECTED ng/mg{creat}
Alprazolam: NOT DETECTED ng/mg{creat}
BARBITURATES IA: NEGATIVE ng/mL
BUPRENORPHINE: POSITIVE
Benzodiazepines: NEGATIVE
Buprenorphine: 207 ng/mg{creat}
COCAINE METABOLITE IA: NEGATIVE ng/mL
Clonazepam: NOT DETECTED ng/mg{creat}
Creatinine: 216 mg/dL (ref >=20)
Desalkylflurazepam: NOT DETECTED ng/mg{creat}
Desmethyldiazepam: NOT DETECTED ng/mg{creat}
Desmethylflunitrazepam: NOT DETECTED ng/mg{creat}
Diazepam: NOT DETECTED ng/mg{creat}
ETHYL ALCOHOL Enzymatic: NEGATIVE g/dL
FENTANYL: NEGATIVE
Fentanyl: NOT DETECTED ng/mg{creat}
Flunitrazepam: NOT DETECTED ng/mg{creat}
Lorazepam: NOT DETECTED ng/mg{creat}
METHADONE IA: NEGATIVE ng/mL
METHADONE MTB IA: NEGATIVE ng/mL
Midazolam: NOT DETECTED ng/mg{creat}
Norbuprenorphine: >463 ng/mg{creat}
Norfentanyl: NOT DETECTED ng/mg{creat}
OPIATE CLASS IA: NEGATIVE ng/mL
OXYCODONE CLASS IA: NEGATIVE ng/mL
Oxazepam: NOT DETECTED ng/mg{creat}
PHENCYCLIDINE IA: NEGATIVE ng/mL
TAPENTADOL, IA: NEGATIVE ng/mL
TRAMADOL IA: NEGATIVE ng/mL
Temazepam: NOT DETECTED ng/mg{creat}

## 2024-01-30 LAB — CANNABINOIDS, MS, UR RFX
Cannabinoids Confirmation: POSITIVE
Carboxy-THC: >463 ng/mg{creat}

## 2024-01-30 LAB — CULTURE, BETA STREP (GROUP B ONLY): Strep Gp B Culture: POSITIVE — AB

## 2024-02-02 ENCOUNTER — Ambulatory Visit: Payer: Self-pay | Admitting: Advanced Practice Midwife

## 2024-02-03 ENCOUNTER — Telehealth (INDEPENDENT_AMBULATORY_CARE_PROVIDER_SITE_OTHER): Payer: MEDICAID | Admitting: Advanced Practice Midwife

## 2024-02-03 ENCOUNTER — Ambulatory Visit: Payer: MEDICAID

## 2024-02-03 ENCOUNTER — Telehealth: Payer: MEDICAID

## 2024-02-03 VITALS — BP 134/87

## 2024-02-03 DIAGNOSIS — O99323 Drug use complicating pregnancy, third trimester: Secondary | ICD-10-CM

## 2024-02-03 DIAGNOSIS — O365930 Maternal care for other known or suspected poor fetal growth, third trimester, not applicable or unspecified: Secondary | ICD-10-CM | POA: Diagnosis not present

## 2024-02-03 DIAGNOSIS — Z3A37 37 weeks gestation of pregnancy: Secondary | ICD-10-CM

## 2024-02-03 DIAGNOSIS — O98113 Syphilis complicating pregnancy, third trimester: Secondary | ICD-10-CM | POA: Diagnosis not present

## 2024-02-03 DIAGNOSIS — F1120 Opioid dependence, uncomplicated: Secondary | ICD-10-CM

## 2024-02-03 DIAGNOSIS — F1910 Other psychoactive substance abuse, uncomplicated: Secondary | ICD-10-CM

## 2024-02-03 DIAGNOSIS — O0993 Supervision of high risk pregnancy, unspecified, third trimester: Secondary | ICD-10-CM

## 2024-02-04 ENCOUNTER — Ambulatory Visit: Payer: MEDICAID

## 2024-02-04 ENCOUNTER — Ambulatory Visit: Payer: MEDICAID | Attending: Obstetrics and Gynecology | Admitting: Obstetrics and Gynecology

## 2024-02-04 ENCOUNTER — Encounter (HOSPITAL_COMMUNITY): Payer: Self-pay | Admitting: Obstetrics and Gynecology

## 2024-02-04 ENCOUNTER — Encounter (HOSPITAL_COMMUNITY): Payer: Self-pay

## 2024-02-04 ENCOUNTER — Other Ambulatory Visit: Payer: Self-pay

## 2024-02-04 ENCOUNTER — Inpatient Hospital Stay (HOSPITAL_COMMUNITY)
Admission: AD | Admit: 2024-02-04 | Discharge: 2024-02-07 | DRG: 806 | Disposition: A | Payer: MEDICAID | Attending: Family Medicine | Admitting: Family Medicine

## 2024-02-04 DIAGNOSIS — E66813 Obesity, class 3: Secondary | ICD-10-CM | POA: Diagnosis present

## 2024-02-04 DIAGNOSIS — O99214 Obesity complicating childbirth: Secondary | ICD-10-CM | POA: Diagnosis present

## 2024-02-04 DIAGNOSIS — O365990 Maternal care for other known or suspected poor fetal growth, unspecified trimester, not applicable or unspecified: Secondary | ICD-10-CM | POA: Diagnosis present

## 2024-02-04 DIAGNOSIS — O99213 Obesity complicating pregnancy, third trimester: Secondary | ICD-10-CM

## 2024-02-04 DIAGNOSIS — O359XX0 Maternal care for (suspected) fetal abnormality and damage, unspecified, not applicable or unspecified: Secondary | ICD-10-CM | POA: Insufficient documentation

## 2024-02-04 DIAGNOSIS — O365930 Maternal care for other known or suspected poor fetal growth, third trimester, not applicable or unspecified: Principal | ICD-10-CM | POA: Diagnosis present

## 2024-02-04 DIAGNOSIS — O99820 Streptococcus B carrier state complicating pregnancy: Secondary | ICD-10-CM | POA: Diagnosis not present

## 2024-02-04 DIAGNOSIS — Z3A37 37 weeks gestation of pregnancy: Secondary | ICD-10-CM

## 2024-02-04 DIAGNOSIS — O289 Unspecified abnormal findings on antenatal screening of mother: Secondary | ICD-10-CM | POA: Insufficient documentation

## 2024-02-04 DIAGNOSIS — O9962 Diseases of the digestive system complicating childbirth: Secondary | ICD-10-CM | POA: Diagnosis present

## 2024-02-04 DIAGNOSIS — Z8249 Family history of ischemic heart disease and other diseases of the circulatory system: Secondary | ICD-10-CM

## 2024-02-04 DIAGNOSIS — Z3490 Encounter for supervision of normal pregnancy, unspecified, unspecified trimester: Secondary | ICD-10-CM

## 2024-02-04 DIAGNOSIS — O98119 Syphilis complicating pregnancy, unspecified trimester: Secondary | ICD-10-CM | POA: Diagnosis present

## 2024-02-04 DIAGNOSIS — K219 Gastro-esophageal reflux disease without esophagitis: Secondary | ICD-10-CM | POA: Diagnosis present

## 2024-02-04 DIAGNOSIS — O368330 Maternal care for abnormalities of the fetal heart rate or rhythm, third trimester, not applicable or unspecified: Secondary | ICD-10-CM | POA: Diagnosis not present

## 2024-02-04 DIAGNOSIS — O99320 Drug use complicating pregnancy, unspecified trimester: Secondary | ICD-10-CM | POA: Diagnosis present

## 2024-02-04 DIAGNOSIS — O0990 Supervision of high risk pregnancy, unspecified, unspecified trimester: Secondary | ICD-10-CM

## 2024-02-04 DIAGNOSIS — Z362 Encounter for other antenatal screening follow-up: Secondary | ICD-10-CM | POA: Insufficient documentation

## 2024-02-04 DIAGNOSIS — Z59819 Housing instability, housed unspecified: Secondary | ICD-10-CM | POA: Diagnosis present

## 2024-02-04 DIAGNOSIS — O99344 Other mental disorders complicating childbirth: Secondary | ICD-10-CM | POA: Diagnosis present

## 2024-02-04 DIAGNOSIS — O135 Gestational [pregnancy-induced] hypertension without significant proteinuria, complicating the puerperium: Secondary | ICD-10-CM | POA: Diagnosis not present

## 2024-02-04 DIAGNOSIS — Z87891 Personal history of nicotine dependence: Secondary | ICD-10-CM

## 2024-02-04 DIAGNOSIS — O09899 Supervision of other high risk pregnancies, unspecified trimester: Secondary | ICD-10-CM

## 2024-02-04 DIAGNOSIS — O99324 Drug use complicating childbirth: Secondary | ICD-10-CM | POA: Diagnosis present

## 2024-02-04 DIAGNOSIS — F332 Major depressive disorder, recurrent severe without psychotic features: Secondary | ICD-10-CM | POA: Diagnosis present

## 2024-02-04 DIAGNOSIS — F1290 Cannabis use, unspecified, uncomplicated: Secondary | ICD-10-CM | POA: Diagnosis not present

## 2024-02-04 DIAGNOSIS — O99323 Drug use complicating pregnancy, third trimester: Secondary | ICD-10-CM | POA: Insufficient documentation

## 2024-02-04 DIAGNOSIS — O99824 Streptococcus B carrier state complicating childbirth: Secondary | ICD-10-CM | POA: Diagnosis present

## 2024-02-04 DIAGNOSIS — F1120 Opioid dependence, uncomplicated: Secondary | ICD-10-CM | POA: Diagnosis present

## 2024-02-04 DIAGNOSIS — O98113 Syphilis complicating pregnancy, third trimester: Secondary | ICD-10-CM | POA: Insufficient documentation

## 2024-02-04 DIAGNOSIS — R200 Anesthesia of skin: Secondary | ICD-10-CM | POA: Diagnosis not present

## 2024-02-04 DIAGNOSIS — Z833 Family history of diabetes mellitus: Secondary | ICD-10-CM

## 2024-02-04 LAB — COMPREHENSIVE METABOLIC PANEL WITH GFR
ALT: 12 U/L (ref 0–44)
AST: 18 U/L (ref 15–41)
Albumin: 2.6 g/dL — ABNORMAL LOW (ref 3.5–5.0)
Alkaline Phosphatase: 109 U/L (ref 38–126)
Anion gap: 7 (ref 5–15)
BUN: 9 mg/dL (ref 6–20)
CO2: 21 mmol/L — ABNORMAL LOW (ref 22–32)
Calcium: 8.2 mg/dL — ABNORMAL LOW (ref 8.9–10.3)
Chloride: 104 mmol/L (ref 98–111)
Creatinine, Ser: 0.6 mg/dL (ref 0.44–1.00)
GFR, Estimated: >60 mL/min (ref >60)
Glucose, Bld: 77 mg/dL (ref 70–99)
Potassium: 3.5 mmol/L (ref 3.5–5.1)
Sodium: 132 mmol/L — ABNORMAL LOW (ref 135–145)
Total Bilirubin: 0.5 mg/dL (ref 0.0–1.2)
Total Protein: 6.6 g/dL (ref 6.5–8.1)

## 2024-02-04 LAB — CBC
HCT: 37.4 % (ref 36.0–46.0)
Hemoglobin: 13 g/dL (ref 12.0–15.0)
MCH: 27.9 pg (ref 26.0–34.0)
MCHC: 34.8 g/dL (ref 30.0–36.0)
MCV: 80.3 fL (ref 80.0–100.0)
Platelets: 197 K/uL (ref 150–400)
RBC: 4.66 MIL/uL (ref 3.87–5.11)
RDW: 13.4 % (ref 11.5–15.5)
WBC: 10.3 K/uL (ref 4.0–10.5)
nRBC: 0 % (ref 0.0–0.2)

## 2024-02-04 LAB — TYPE AND SCREEN
ABO/RH(D): B POS
Antibody Screen: NEGATIVE

## 2024-02-04 LAB — RAPID URINE DRUG SCREEN, HOSP PERFORMED
Amphetamines: NOT DETECTED
Barbiturates: NOT DETECTED
Benzodiazepines: NOT DETECTED
Cocaine: NOT DETECTED
Opiates: NOT DETECTED
Tetrahydrocannabinol: POSITIVE — AB

## 2024-02-04 LAB — PROTEIN / CREATININE RATIO, URINE
Creatinine, Urine: 328 mg/dL
Protein Creatinine Ratio: 0.08 mg/mg{creat} (ref 0.00–0.15)
Total Protein, Urine: 26 mg/dL

## 2024-02-04 MED ORDER — OXYTOCIN-SODIUM CHLORIDE 30-0.9 UT/500ML-% IV SOLN
1.0000 m[IU]/min | INTRAVENOUS | Status: DC
  Administered 2024-02-04: 2 m[IU]/min via INTRAVENOUS
  Filled 2024-02-04: qty 500

## 2024-02-04 MED ORDER — ACETAMINOPHEN 325 MG PO TABS: 650.0000 mg | ORAL_TABLET | ORAL | Status: DC | PRN

## 2024-02-04 MED ORDER — ZOLPIDEM TARTRATE 5 MG PO TABS
5.0000 mg | ORAL_TABLET | Freq: Every day | ORAL | Status: DC
  Administered 2024-02-04: 5 mg via ORAL
  Filled 2024-02-04: qty 1

## 2024-02-04 MED ORDER — FENTANYL-BUPIVACAINE-NACL 0.5-0.125-0.9 MG/250ML-% EP SOLN
12.0000 mL/h | EPIDURAL | Status: DC | PRN
  Administered 2024-02-05 (×2): 12 mL/h via EPIDURAL
  Filled 2024-02-04 (×2): qty 250

## 2024-02-04 MED ORDER — LACTATED RINGERS IV SOLN
500.0000 mL | Freq: Once | INTRAVENOUS | Status: AC
  Administered 2024-02-04: 500 mL via INTRAVENOUS

## 2024-02-04 MED ORDER — PHENYLEPHRINE 80 MCG/ML (10ML) SYRINGE FOR IV PUSH (FOR BLOOD PRESSURE SUPPORT): 80.0000 ug | PREFILLED_SYRINGE | INTRAVENOUS | Status: DC | PRN

## 2024-02-04 MED ORDER — SERTRALINE HCL 50 MG PO TABS
150.0000 mg | ORAL_TABLET | Freq: Every day | ORAL | Status: DC
  Filled 2024-02-04: qty 1

## 2024-02-04 MED ORDER — SOD CITRATE-CITRIC ACID 500-334 MG/5ML PO SOLN
30.0000 mL | ORAL | Status: DC | PRN
  Filled 2024-02-04: qty 30

## 2024-02-04 MED ORDER — OXYTOCIN-SODIUM CHLORIDE 30-0.9 UT/500ML-% IV SOLN: 2.5000 [IU]/h | INTRAVENOUS | Status: DC

## 2024-02-04 MED ORDER — PENICILLIN G POT IN DEXTROSE 60000 UNIT/ML IV SOLN
3.0000 10*6.[IU] | INTRAVENOUS | Status: DC
  Administered 2024-02-04 – 2024-02-05 (×6): 3 10*6.[IU] via INTRAVENOUS
  Filled 2024-02-04 (×7): qty 50

## 2024-02-04 MED ORDER — BUPRENORPHINE HCL-NALOXONE HCL 8-2 MG SL SUBL
1.0000 | SUBLINGUAL_TABLET | Freq: Four times a day (QID) | SUBLINGUAL | Status: DC
  Administered 2024-02-04 – 2024-02-07 (×11): 1 via SUBLINGUAL
  Filled 2024-02-04 (×11): qty 1

## 2024-02-04 MED ORDER — TERBUTALINE SULFATE 1 MG/ML IJ SOLN
0.2500 mg | Freq: Once | INTRAMUSCULAR | Status: AC | PRN
  Administered 2024-02-05: 0.25 mg via SUBCUTANEOUS
  Filled 2024-02-04: qty 1

## 2024-02-04 MED ORDER — FENTANYL CITRATE (PF) 100 MCG/2ML IJ SOLN: 50.0000 ug | INTRAMUSCULAR | Status: DC | PRN

## 2024-02-04 MED ORDER — SERTRALINE HCL 50 MG PO TABS
150.0000 mg | ORAL_TABLET | Freq: Every day | ORAL | Status: DC
  Administered 2024-02-04 – 2024-02-06 (×3): 150 mg via ORAL
  Filled 2024-02-04 (×4): qty 1

## 2024-02-04 MED ORDER — DIPHENHYDRAMINE HCL 50 MG/ML IJ SOLN: 12.5000 mg | INTRAMUSCULAR | Status: DC | PRN

## 2024-02-04 MED ORDER — EPHEDRINE 5 MG/ML INJ: 10.0000 mg | INTRAVENOUS | Status: DC | PRN

## 2024-02-04 MED ORDER — HYDROXYZINE HCL 50 MG PO TABS
25.0000 mg | ORAL_TABLET | Freq: Every day | ORAL | Status: DC
  Administered 2024-02-04 – 2024-02-05 (×2): 50 mg via ORAL
  Filled 2024-02-04 (×2): qty 1

## 2024-02-04 MED ORDER — LACTATED RINGERS IV SOLN: INTRAVENOUS | Status: AC

## 2024-02-04 MED ORDER — OXYTOCIN BOLUS FROM INFUSION
333.0000 mL | Freq: Once | INTRAVENOUS | Status: AC
  Administered 2024-02-05: 333 mL via INTRAVENOUS

## 2024-02-04 MED ORDER — LIDOCAINE HCL (PF) 1 % IJ SOLN: 30.0000 mL | INTRAMUSCULAR | Status: DC | PRN

## 2024-02-04 MED ORDER — FLEET ENEMA RE ENEM: 1.0000 | ENEMA | RECTAL | Status: DC | PRN

## 2024-02-04 MED ORDER — LACTATED RINGERS IV SOLN
500.0000 mL | INTRAVENOUS | Status: AC | PRN
  Administered 2024-02-04: 1000 mL via INTRAVENOUS
  Administered 2024-02-05: 500 mL via INTRAVENOUS

## 2024-02-04 MED ORDER — ONDANSETRON HCL 4 MG/2ML IJ SOLN
4.0000 mg | Freq: Four times a day (QID) | INTRAMUSCULAR | Status: DC | PRN
  Administered 2024-02-05: 4 mg via INTRAVENOUS
  Filled 2024-02-04: qty 2

## 2024-02-04 MED ORDER — HYDROXYZINE HCL 50 MG PO TABS: 25.0000 mg | ORAL_TABLET | Freq: Four times a day (QID) | ORAL | Status: DC | PRN

## 2024-02-04 MED ORDER — SODIUM CHLORIDE 0.9 % IV SOLN
5.0000 10*6.[IU] | Freq: Once | INTRAVENOUS | Status: AC
  Administered 2024-02-04: 5 10*6.[IU] via INTRAVENOUS
  Filled 2024-02-04: qty 5

## 2024-02-04 NOTE — Progress Notes (Addendum)
 Maternal-Fetal Medicine Consultation  Name: Michelle Stephens  MRN: [id number]  GA: [id number] [redacted]w[redacted]d   Fetal growth restriction.  On ultrasound performed about 6 weeks ago, estimated fetal weight at the abdominal circumference measurements were at the 4th percentile.  NST was nonreactive at your office. Patient does not have gestational diabetes or hypertension.  Blood pressure today at our office is 137/75 mmHg. She is on Suboxone  maintenance.  Single umbilical artery were diagnosed with anatomy scan.  Ultrasound The estimated fetal weight and the abdominal circumference measurements are at the first percentiles.  Head circumference measurement is set between -2 and -3 SD (intracranial structures appear normal.  Normal amniotic fluid.  Umbilical artery Doppler showed normal forward diastolic flow.  NST is not reactive.  BPP 8/10.  Cephalic presentation.  I explained the finding of severe fetal growth restriction and a nonreactive NSTs today and at your office.  Severe fetal growth restriction is associated with increased risks of perinatal mortality and morbidity.  I recommended delivery now.  Head circumference measurement at -2 to -3 SD is not associated with microcephaly in more than 90% of cases. Patient agreed with my recommendations.  She was counseled that there is an increased likelihood of cesarean delivery.  Discussed with [place]  Claudene, CNM.   Recommendations - Patient will be going over to the MAU first. - Oxytocin  challenge test may be performed before prostaglandins.     Consultation including face-to-face (more than 50%) counseling 20 minutes.

## 2024-02-04 NOTE — Progress Notes (Signed)
 Labor Progress Note Michelle Stephens is a 23 y.o. G4P0030 at [redacted]w[redacted]d presented for  induction of labor for severe fetal growth restriction and non-reactive NST. MFM recommends delivery now.   S:  Beginning to feel contractions  O:  BP 135/85   Pulse 71   Temp (!) 97.4 F (36.3 C) (Oral)   Resp 18   Ht 5' 5 (1.651 m)   Wt 119.7 kg   LMP 05/07/2023 (Exact Date)   BMI 43.93 kg/m   EFM: baseline 145 bpm/ moderate variability/ 15x15 accels/ no decels  Toco/IUPC: irregular SVE: Dilation: Fingertip Effacement (%): Thick Cervical Position: Middle Station: [place], -3 Presentation: Vertex Exam by:: Michelle Stephens, CNM   A/P: 24 y.o. G4P0030 [redacted]w[redacted]d  1. Labor: IOL. Foley bulb placed.  2. FWB: Cat 1 3. Pain: Analgesia/anesthesia PRN  4. GBS +: PNC 5. 37.1 week IUP 6. 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 Syphilis: Adequately Tx'd.  8. Housing instability and barriers to care: SW consult 9. Severe FGR: Nml dopplers   Anticipate SVB.  Michelle Stephens Me, Student-MidWife 8:27 PM

## 2024-02-04 NOTE — Procedures (Signed)
 Michelle Stephens October 18, 2000 [redacted]w[redacted]d  Fetus A Non-Stress Test Interpretation for 02/04/24  Indication: IUGR,   Fetal Heart Rate A Mode: External Baseline Rate (A): 145 bpm Variability: Moderate, Minimal Accelerations: 10 x 10 Decelerations: Variable Multiple birth?: No  Uterine Activity Mode: Toco Contraction Frequency (min): none Resting Tone Palpated: Relaxed  Interpretation (Fetal Testing) Nonstress Test Interpretation: Non-reactive Comments: Tracing reviewed byDr. Arna

## 2024-02-04 NOTE — H&P (Signed)
 HPI: Michelle Stephens is a 23 y.o. year old G74P0030 female at [redacted]w[redacted]d weeks gestation by LMP and 6 week US  who presents to Labor and Delivery reporting induction of labor for severe fetal growth restriction and non-reactive NST. MFM recommends delivery now.   US  02/04/24: Est. FW: 2093 gm 4 lb 10 oz < 1 %    NURSING  PROVIDER  Office Location [facility] > [facility] ([facility]) Dating by 6w US  c/w LMP  PNC Model Traditional Anatomy U/S Normal, f/w MFM  Initiated care at  19wks                Language  English              LAB RESULTS   Support Person Michelle Stephens (partner)  Genetics NIPS: LR female AFP: normal    NT/IT (FT only)     Carrier Screen Hgb normal  Rhogam  --/--/B POS (09/19 1626) A1C/GTT Early HgbA1C: 5.1 (08/14/23) Third trimester 2 hr GTT: Elevated 1 hr>2hr GTT normal  Flu Vaccine Declined 12/09/23    TDaP Vaccine 12/01/23 Blood Type --/--/B POS (09/19 1626)  RSV Vaccine 01/20/24 Antibody NEG (09/19 1626)  COVID Vaccine  Rubella Nonimmune (06/19 0000)  Feeding Plan breast RPR Reactive (10/03 1101)  titer 1:1; t pal antibody reactive>s/p bicillin  x3  Contraception  HBsAg Negative (06/19 0000)  Circumcision NA HIV Non Reactive (10/03 1101)  Pediatrician  [facility] HCVAb Negative (06/19 0000)  Prenatal Classes Info given    BTL Consent  Pap Diagnosis  Date Value Ref Range Status  10/14/2023   Final   - Negative for intraepithelial lesion or malignancy (NILM)    BTL Pre-payment  GC/CT Initial: neg (08/19/23) 36wks:    VBAC Consent NA GBS Pos For PCN allergy, check sensitivities   BRx Optimized? [ ]  yes   [ ]  no    DME Rx [ ]  BP cuff [ ]  Weight Scale Waterbirth  [ ]  Class [ ]  Consent [ ]  CNM visit  PHQ9 & GAD7 [  ] new OB [  ] 28 weeks  [  ] 36 weeks Induction  [ ]  Orders Entered [ ] Foley Y/N     OB History     Gravida  4   Para  0   Term  0   Preterm  0   AB  3   Living  0      SAB  3   IAB  0   Ectopic  0   Multiple  0   Live Births  0           Past Medical History:  Diagnosis Date   ADHD (attention deficit hyperactivity disorder)    Allergy    seasonal   Anxiety    Asthma    when young   Depression    Eczema    GERD (gastroesophageal reflux disease)    Headache(784.0)    Heart murmur    when a baby   JRA (juvenile rheumatoid arthritis) (HCC)    Obesity    Urinary tract infection    Vision abnormalities    Past Surgical History:  Procedure Laterality Date   WISDOM TOOTH EXTRACTION  2019   Family History: family history includes Asthma in her mother; Depression in her mother; Diabetes in her mother; Heart failure in her mother; Hypertension in her mother; Kidney failure in her mother; Liver disease in her mother; Mental illness in her mother; Other in her  father. Social History:  reports that she has quit smoking. Her smoking use included cigarettes. She has quit using smokeless tobacco. She reports that she does not currently use alcohol. She reports current drug use. Drugs: Marijuana, Cocaine , and Fentanyl .     Maternal Diabetes: No Genetic Screening: Normal Maternal Ultrasounds/Referrals: IUGR Fetal Ultrasounds or other Referrals:  Referred to Materal Fetal Medicine  Maternal Substance Abuse:  Yes:  Type: Marijuana, Other:  Significant Maternal Medications:  Meds include: Zoloft  Other: Suboxone , Ambien , Vistaril   Significant Maternal Lab Results:  Group B Strep positive and Other: Syphilis Number of Prenatal Visits:greater than 3 verified prenatal visits Maternal Vaccinations:RSV: Given during pregnancy >/=14 days ago and TDap Other Comments: Syphilis adequate Tx  Review of Systems  Constitutional:  Negative for chills and fever.  Eyes:  Negative for visual disturbance.  Gastrointestinal:  Negative for abdominal pain and vomiting.  Genitourinary:  Negative for vaginal bleeding and vaginal discharge.  Neurological:  Negative for headaches.   Maternal Medical History:  Reason for admission: Induction for  FGR  Contractions: Frequency: rare.   Perceived severity is mild.   Fetal activity: Perceived fetal activity is normal.   Last perceived fetal movement was within the past hour.   Prenatal complications: Infection, IUGR and substance abuse.   No PIH, oligohydramnios or pre-eclampsia.   Prenatal Complications - Diabetes: none.     Last menstrual period 05/07/2023. Maternal Exam:  Uterine Assessment: Contraction strength is mild.  Contraction frequency is rare.  Abdomen: Patient reports no abdominal tenderness. Estimated fetal weight is 4-10.   Fetal presentation: vertex Introitus: Normal vulva. Normal vagina.  Vagina is negative for discharge.  Pelvis: adequate for delivery.   Cervix: Cervix evaluated by digital exam.     Fetal Exam Fetal Monitor Review: Baseline rate: 150.  Variability: moderate (6-25 bpm).   Pattern: no decelerations and no accelerations.   Fetal State Assessment: Category I - tracings are normal. Non-reactive but Category I  Physical Exam Vitals and nursing note reviewed. Exam conducted with a chaperone present.  Constitutional:      General: She is not in acute distress.    Appearance: She is well-developed.  HENT:     Head: Normocephalic.  Eyes:     Conjunctiva/sclera: Conjunctivae normal.  Cardiovascular:     Rate and Rhythm: Normal rate and regular rhythm.     Heart sounds: Normal heart sounds.  Pulmonary:     Effort: Pulmonary effort is normal. No respiratory distress.     Breath sounds: Normal breath sounds.  Abdominal:     Palpations: Abdomen is soft.     Tenderness: There is no abdominal tenderness.  Genitourinary:    General: Normal vulva.     Vagina: No vaginal discharge or bleeding.  Musculoskeletal:        General: Normal range of motion.     Cervical back: Normal range of motion and neck supple.     Right lower leg: No edema.     Left lower leg: No edema.  Skin:    General: Skin is warm and dry.  Neurological:     Mental  Status: She is alert and oriented to person, place, and time.  Psychiatric:        Mood and Affect: Mood normal.     Prenatal labs: ABO, Rh: --/--/B POS (09/19 1626) Antibody: NEG (09/19 1626) Rubella: Nonimmune (06/19 0000) RPR: Reactive (12/02 1639)  HBsAg: Negative (06/19 0000)  HIV: Non Reactive (10/03 1101)  GBS: Positive/-- (12/02  1639)   07/17/23: RPR reactive. Titer 1:2 Bicillin  IM: 5/22, 5/29, 6/5. Records in media tab. 08/14/23: Titer 1:1 at [facility]  Latest Reference Range & Units 11/14/23 16:30 11/28/23 11:01 01/20/24 16:57 01/27/24 16:39  Rapid Plasma Reagin, Quant NonRea<1:1 titer  1:1 (H) 1:2 (H) 1:1 (H)  T Pallidum Abs Non Reactive  Reactive ! Reactive ! Reactive ! Reactive !  RPR Non Reactive  Reactive ! Reactive ! Reactive ! Reactive !  RPR Titer  1:1      Assessment: 1. Labor: IOL 2. Fetal Wellbeing: Category I (non-reactive)  3. Pain Control: Plans epidural 4. GBS: Pos 5. 37.1 week IUP 6. OUD stable on Suboxone  8 QID 7. Syphilis. Adequately Tx'd.  8. Housing instability and barriers to care 9. Severe FGR. Nml dopplers  Plan:  1. Admit to BS per consult with MD 2. Routine L&D orders 3. Analgesia/anesthesia PRN  4. PCN for GBS 5. Buprenorphine  8 QID.  6. Check syphilis titers 7. SW consult 8. Start low dose pitocin . Recommended foley. Pt would like to think about it. AROM PRN 9. Discussed anticipating prolonged hospital stay for baby due to NAS and severe FGR. NICU is full and baby may need to be transferred out if NICU care needed.  Reinhard Schack  Claudene 02/04/2024, 3:48 PM

## 2024-02-05 ENCOUNTER — Encounter (HOSPITAL_COMMUNITY): Payer: Self-pay | Admitting: Obstetrics and Gynecology

## 2024-02-05 ENCOUNTER — Inpatient Hospital Stay (HOSPITAL_COMMUNITY): Payer: MEDICAID | Admitting: Anesthesiology

## 2024-02-05 DIAGNOSIS — O99344 Other mental disorders complicating childbirth: Secondary | ICD-10-CM

## 2024-02-05 DIAGNOSIS — O99820 Streptococcus B carrier state complicating pregnancy: Secondary | ICD-10-CM

## 2024-02-05 DIAGNOSIS — O365930 Maternal care for other known or suspected poor fetal growth, third trimester, not applicable or unspecified: Secondary | ICD-10-CM

## 2024-02-05 DIAGNOSIS — F1290 Cannabis use, unspecified, uncomplicated: Secondary | ICD-10-CM

## 2024-02-05 DIAGNOSIS — O368330 Maternal care for abnormalities of the fetal heart rate or rhythm, third trimester, not applicable or unspecified: Secondary | ICD-10-CM

## 2024-02-05 DIAGNOSIS — O99324 Drug use complicating childbirth: Secondary | ICD-10-CM

## 2024-02-05 DIAGNOSIS — Z3A37 37 weeks gestation of pregnancy: Secondary | ICD-10-CM

## 2024-02-05 LAB — SYPHILIS: RPR W/REFLEX TO RPR TITER AND TREPONEMAL ANTIBODIES, TRADITIONAL SCREENING AND DIAGNOSIS ALGORITHM
RPR Ser Ql: REACTIVE — AB
RPR Titer: 1:1 {titer}

## 2024-02-05 MED ORDER — LIDOCAINE HCL (PF) 1 % IJ SOLN
INTRAMUSCULAR | Status: DC | PRN
  Administered 2024-02-05 (×2): 4 mL via EPIDURAL

## 2024-02-05 MED ORDER — LACTATED RINGERS AMNIOINFUSION: INTRAVENOUS | Status: DC

## 2024-02-05 MED ORDER — FUROSEMIDE 20 MG PO TABS
20.0000 mg | ORAL_TABLET | Freq: Every day | ORAL | Status: DC
  Administered 2024-02-06 – 2024-02-07 (×2): 20 mg via ORAL
  Filled 2024-02-05 (×2): qty 1

## 2024-02-05 MED ORDER — LACTATED RINGERS IV SOLN: 500.0000 mL | INTRAVENOUS | Status: DC | PRN

## 2024-02-05 MED ORDER — LACTATED RINGERS IV SOLN: INTRAVENOUS | Status: DC

## 2024-02-05 MED ORDER — POTASSIUM CHLORIDE CRYS ER 20 MEQ PO TBCR
20.0000 meq | EXTENDED_RELEASE_TABLET | Freq: Every day | ORAL | Status: DC
  Administered 2024-02-06 – 2024-02-07 (×2): 20 meq via ORAL
  Filled 2024-02-05 (×2): qty 1

## 2024-02-05 NOTE — Progress Notes (Signed)
 Patient ID: Michelle Stephens, female   DOB: 03-23-2000, 23 y.o.   MRN: [id number]  Patient comfortable with epidural.  BP 128/88   Pulse 67   Temp (!) 97.4 F (36.3 C) (Oral)   Resp 18   Ht 5' 5 (1.651 m)   Wt 119.7 kg   LMP 05/07/2023 (Exact Date)   SpO2 98%   BMI 43.93 kg/m   Dilation: 3 Effacement (%): 80 Cervical Position: Middle Station: -2 Presentation: Vertex Exam by:: Dr. Barbra  AROM with light mec IUPC placed.  After IUPC placement, prolonged deceleration occurred. FSE placed. Terbutaline given for tachysystole. IV Fluid bolus given. Amnio-infusion started - 300mL bolus with 150mL continuous.  Baby's heartrate recovered with good variability.  [name], DO 02/05/2024

## 2024-02-05 NOTE — Progress Notes (Signed)
° ° °  Met with Michelle Stephens at her L&D bedside to offer support as part of [facility] team for ongoing substance exposed newborn consult. We discussed her labor plan set by her OB team and expectations following delivery. She stated that she felt well informed and comfortable. Michelle Stephens has been very concerned about her hotel room and voiced those ongoing concerns again during our visit. WENDI Fielding, LCAS attempting to contact community resources, however has not be able to confirm assistance at this time.   We re-discussed the plan of the newborn observation period and utilizing eat/sleep/console if withdrawal symptoms were noted with her infant. She agreed.   Encouraged Michelle Stephens to contact NAS consult phone while in patient if she had any questions and/or concerns.    Comer Fang, NNP-BC Neonatal Nurse Practitioner Substance Exposed Newborn Consult at the [facility] [phone]

## 2024-02-05 NOTE — Anesthesia Procedure Notes (Signed)
 Epidural Patient location during procedure: OB Start time: 02/05/2024 12:21 AM End time: 02/05/2024 12:24 AM  Staffing Anesthesiologist: Paul Lamarr BRAVO, MD Performed: anesthesiologist   Preanesthetic Checklist Completed: patient identified, IV checked, risks and benefits discussed, monitors and equipment checked, pre-op evaluation and timeout performed  Epidural Patient position: sitting Prep: DuraPrep and site prepped and draped Patient monitoring: continuous pulse ox, blood pressure and heart rate Approach: midline Location: L3-L4 Injection technique: LOR air  Needle:  Needle type: Tuohy  Needle gauge: 17 G Needle length: 9 cm Needle insertion depth: 9 cm Catheter type: closed end flexible Catheter size: 19 Gauge Catheter at skin depth: 14 cm Test dose: negative and Other (1% lidocaine )  Assessment Events: blood not aspirated, no cerebrospinal fluid, injection not painful, no injection resistance, no paresthesia and negative IV test  Additional Notes Patient identified. Risks, benefits, and alternatives discussed with patient including but not limited to bleeding, infection, nerve damage, paralysis, failed block, incomplete pain control, headache, blood pressure changes, nausea, vomiting, reactions to medication, itching, and postpartum back pain. Confirmed with bedside nurse the patient's most recent platelet count. Confirmed with patient that they are not currently taking any anticoagulation, have any bleeding history, or any family history of bleeding disorders. Patient expressed understanding and wished to proceed. All questions were answered. Sterile technique was used throughout the entire procedure. Please see nursing notes for vital signs.   Crisp LOR on first pass. Test dose was given through epidural catheter and negative prior to continuing to dose epidural or start infusion. Warning signs of high block given to the patient including shortness of breath,  tingling/numbness in hands, complete motor block, or any concerning symptoms with instructions to call for help. Patient was given instructions on fall risk and not to get out of bed. All questions and concerns addressed with instructions to call with any issues or inadequate analgesia.  Reason for block:procedure for pain

## 2024-02-05 NOTE — Discharge Summary (Signed)
 Postpartum Discharge Summary     Patient Name: Michelle Stephens DOB: 2000-10-21 MRN: [id number]  Date of admission: 02/04/2024 Delivery date:02/05/2024 Delivering provider: NEWTON MERING Date of discharge: 02/07/2024  Admitting diagnosis: Fetal growth restriction antepartum [O36.5990] Intrauterine pregnancy: [redacted]w[redacted]d     Secondary diagnosis:  Principal Problem:   Fetal growth restriction antepartum Active Problems:   MDD (major depressive disorder), recurrent episode, severe   Supervision of high risk pregnancy, antepartum   Substance abuse affecting pregnancy, antepartum (HCC)   Syphilis affecting pregnancy   Rubella non-immune status, antepartum   Housing insecurity   Single umbilical artery affecting management of mother in singleton pregnancy, antepartum   Suboxone  maintenance treatment complicating pregnancy, antepartum (HCC)   Vaginal delivery  Additional problems: n/a    Discharge diagnosis: Term Pregnancy Delivered                                              Post partum procedures:none Augmentation: AROM, Pitocin , Cytotec, and IP Foley Complications: None  Hospital course: Induction of Labor With Vaginal Delivery   23 y.o. yo [phone] at [redacted]w[redacted]d was admitted to the hospital 02/04/2024 for induction of labor.  Indication for induction: FGR.  Patient had an labor course complicated bynothing Membrane Rupture Time/Date: 12:10 PM,02/05/2024  Delivery Method:Vaginal, Spontaneous Operative Delivery:N/A Episiotomy: None Lacerations:  None Details of delivery can be found in separate delivery note.  Patient had a postpartum course complicated by elevated BP's requiring initiation of Nifedipine  30 XL. Patient is discharged home 02/07/2024.  Newborn Data: Birth date:02/05/2024 Birth time:10:19 PM Gender:Female Living status:Living Apgars:8 ,9  Weight:2040 g  Magnesium Sulfate received: No BMZ received: No Rhophylac:N/A MMR: ordered T-DaP:Given  prenatally Flu: declined prenatally RSV Vaccine received: Yes Transfusion:No  Immunizations received: Immunization History  Administered Date(s) Administered    sv, Bivalent, Protein Subunit Rsvpref,pf [place]) 01/20/2024   Tdap 12/01/2023    Physical exam  Vitals:   02/06/24 1307 02/06/24 2009 02/06/24 2028 02/07/24 0657  BP: 121/81 (!) 137/96 124/85 116/71  Pulse: 98 90  69  Resp: 18 18  16   Temp: 98.2 F (36.8 C) 98.3 F (36.8 C)  97.9 F (36.6 C)  TempSrc: Oral Oral  Oral  SpO2: 98% 96%  98%  Weight:      Height:       General: alert, cooperative, and no distress Lochia: appropriate Uterine Fundus: firm Incision: N/A DVT Evaluation: No evidence of DVT seen on physical exam. Labs: Lab Results  Component Value Date   WBC 10.3 02/04/2024   HGB 13.0 02/04/2024   HCT 37.4 02/04/2024   MCV 80.3 02/04/2024   PLT 197 02/04/2024      Latest Ref Rng & Units 02/04/2024    4:04 PM  CMP  Glucose 70 - 99 mg/dL 77   BUN 6 - 20 mg/dL 9   Creatinine 9.55 - 8.99 mg/dL 9.39   Sodium 864 - 854 mmol/L 132   Potassium 3.5 - 5.1 mmol/L 3.5   Chloride 98 - 111 mmol/L 104   CO2 22 - 32 mmol/L 21   Calcium  8.9 - 10.3 mg/dL 8.2   Total Protein 6.5 - 8.1 g/dL 6.6   Total Bilirubin 0.0 - 1.2 mg/dL 0.5   Alkaline Phos 38 - 126 U/L 109   AST 15 - 41 U/L 18   ALT 0 - 44 U/L 12  Edinburgh Score:     No data to display         No data recorded  After visit meds:  Allergies as of 02/07/2024       Reactions   Coconut Flavoring Agent (non-screening) Hives        Medication List     STOP taking these medications    dicyclomine  10 MG capsule Commonly known as: BENTYL        TAKE these medications    acetaminophen  500 MG tablet Commonly known as: TYLENOL  Take 2 tablets (1,000 mg total) by mouth every 6 (six) hours as needed for mild pain (pain score 1-3), moderate pain (pain score 4-6) or headache.   Buprenorphine  HCl-Naloxone  HCl 8-2 MG Film Commonly  known as: Suboxone  Place 1 Film under the tongue 4 (four) times daily.   calcium  carbonate 500 MG chewable tablet Commonly known as: TUMS - dosed in mg elemental calcium  Chew 1 tablet by mouth as needed for indigestion or heartburn.   famotidine  20 MG tablet Commonly known as: Pepcid  Take 1 tablet (20 mg total) by mouth 2 (two) times daily.   furosemide  20 MG tablet Commonly known as: LASIX  Take 1 tablet (20 mg total) by mouth daily.   hydrOXYzine  25 MG tablet Commonly known as: ATARAX  Take 1 tablet (25 mg total) by mouth every 6 (six) hours as needed for anxiety.   ibuprofen  600 MG tablet Commonly known as: ADVIL  Take 1 tablet (600 mg total) by mouth every 6 (six) hours.   multivitamin-prenatal 27-0.8 MG Tabs tablet Take 1 tablet by mouth daily at 12 noon.   naloxone  4 MG/0.1ML Liqd nasal spray kit Commonly known as: NARCAN  Use as needed to reverse opioid overdose   NIFEdipine  30 MG 24 hr tablet Commonly known as: ADALAT  CC Take 1 tablet (30 mg total) by mouth daily.   ondansetron  4 MG disintegrating tablet Commonly known as: ZOFRAN -ODT Take 1-2 tablets (4-8 mg total) by mouth every 8 (eight) hours as needed for nausea.   polyethylene glycol powder 17 GM/SCOOP powder Commonly known as: GLYCOLAX /MIRALAX  Take 17 g by mouth daily as needed.   potassium chloride  SA 20 MEQ tablet Commonly known as: KLOR-CON  M Take 1 tablet (20 mEq total) by mouth daily.   senna 8.6 MG Tabs tablet Commonly known as: SENOKOT Take 2 tablets (17.2 mg total) by mouth daily as needed for mild constipation.   sertraline  100 MG tablet Commonly known as: Zoloft  Take 1.5 tablets (150 mg total) by mouth at bedtime.   zolpidem  10 MG tablet Commonly known as: AMBIEN  Take 1 tablet (10 mg total) by mouth at bedtime.         Discharge home in stable condition Infant Feeding: Breast Infant Disposition:NICU, symptomatic NAS Discharge instruction: per After Visit Summary and Postpartum  booklet. Activity: Advance as tolerated. Pelvic rest for 6 weeks.  Diet: routine diet Future Appointments: Future Appointments  Date Time Provider Department Center  02/13/2024 10:00 AM [facility] NURSE [facility]  03/09/2024  1:15 PM Lola Donnice HERO, MD [facility]   Follow up Visit:   Please schedule this patient for a In person postpartum visit in 4 weeks with the following provider: Any provider. Additional Postpartum F/U:  Low risk pregnancy complicated by: FGR Delivery mode:  Vaginal, Spontaneous Anticipated Birth Control:  Unsure   02/07/2024 Donnice HERO Lola, MD

## 2024-02-05 NOTE — Progress Notes (Signed)
 Patient ID: Michelle Stephens, female   DOB: Mar 22, 2000, 23 y.o.   MRN: [id number]  Comfortable w epidural; foley still in despite regular tension on catheter  BP 118/84 FHR 120-130s, +accels, no decels Ctx q 2-5 mins Cx: foley palpated within os  IUP@37 .2wks FGR OUD on Suboxone  IOL process  Will begin taking Pitocin  up Plan for AROM at some point after foley dislodges  Michelle Stephens [facility] 02/05/2024 6:23 AM

## 2024-02-05 NOTE — Anesthesia Preprocedure Evaluation (Addendum)
 Anesthesia Evaluation   Patient awake    Reviewed: Allergy & Precautions, Patient's Chart, lab work & pertinent test results  History of Anesthesia Complications Negative for: history of anesthetic complications  Airway Mallampati: II  TM Distance: >3 FB Neck ROM: Full    Dental no notable dental hx.    Pulmonary asthma , former smoker   Pulmonary exam normal        Cardiovascular negative cardio ROS Normal cardiovascular exam     Neuro/Psych  Headaches   Depression       GI/Hepatic ,GERD  ,,(+)     substance abuse (Hx of OUD, on Suboxone )    Endo/Other    Class 3 obesity  Renal/GU negative Renal ROS     Musculoskeletal  (+) Arthritis ,  narcotic dependent  Abdominal   Peds  Hematology negative hematology ROS (+)   Anesthesia Other Findings   Reproductive/Obstetrics                              Anesthesia Physical Anesthesia Plan  ASA: 3  Anesthesia Plan: Epidural   Post-op Pain Management:    Induction:   PONV Risk Score and Plan: Treatment may vary due to age or medical condition  Airway Management Planned: Natural Airway  Additional Equipment: Fetal Monitoring  Intra-op Plan:   Post-operative Plan:   Informed Consent: I have reviewed the patients History and Physical, chart, labs and discussed the procedure including the risks, benefits and alternatives for the proposed anesthesia with the patient or authorized representative who has indicated his/her understanding and acceptance.       Plan Discussed with:   Anesthesia Plan Comments:          Anesthesia Quick Evaluation

## 2024-02-05 NOTE — Progress Notes (Signed)
 Labor Progress Note Michelle Stephens is a 23 y.o. G4P0030 at [redacted]w[redacted]d presented for IOL for FGR and non-reactive NST  S:  Sleepy, but comfortable  O:  BP (!) 127/98   Pulse 71   Temp 98.1 F (36.7 C) (Oral)   Resp 18   Ht 5' 5 (1.651 m)   Wt 119.7 kg   LMP 05/07/2023 (Exact Date)   SpO2 98%   BMI 43.93 kg/m  EFM: baseline 135 bpm/ moderate variability/ + accels/ - decels  Toco/IUPC: ctx q65min SVE: Dilation: Fingertip Effacement (%): Thick Cervical Position: Middle Station:  (placement of FB verified) Presentation: Vertex Exam by:: Luke Gentry, CNM Pitocin : 14 mu/min  A/P: 23 y.o. G4P0030 [redacted]w[redacted]d  1. Labor: Continues on pitocin  for augmentation, hasn't had SVE since foley balloon coming out, plan for exam around 1030am, may place IUPC if needed at that time 2. Pain: Epidural 3. GBS positive, PCN for ppx 4. OUD: on Suboxone   - Anticipate SVD.  Charlie DELENA Courts, MD 9:27 AM

## 2024-02-05 NOTE — Progress Notes (Signed)
 Patient ID: EARLEAN FIDALGO, female   DOB: 2000-08-22, 23 y.o.   MRN: [id number]  Epidural placed just after MN; comfortable; cervical foley still in place x 6h now and low dose Pit @ 10mu/min  BP 116/77, other VSS FHR 120s, +accels, no decels Ctx irreg Cx deferred  IUP@37 .2wks FGR OUD IOL process  Continue to apply traction to cervical foley Plan to start increasing Pitocin  when it dislodges Anticipate vag delivery  Suzen JONETTA Gentry CNM 02/05/2024 2:06 AM

## 2024-02-06 LAB — T.PALLIDUM AB, TOTAL: T Pallidum Abs: REACTIVE — AB

## 2024-02-06 MED ORDER — ONDANSETRON HCL 4 MG PO TABS: 4.0000 mg | ORAL_TABLET | ORAL | Status: DC | PRN

## 2024-02-06 MED ORDER — HYDROXYZINE HCL 25 MG PO TABS
25.0000 mg | ORAL_TABLET | Freq: Three times a day (TID) | ORAL | Status: DC | PRN
  Administered 2024-02-06: 25 mg via ORAL
  Filled 2024-02-06: qty 1

## 2024-02-06 MED ORDER — IBUPROFEN 600 MG PO TABS
600.0000 mg | ORAL_TABLET | Freq: Four times a day (QID) | ORAL | Status: DC
  Administered 2024-02-06 – 2024-02-07 (×7): 600 mg via ORAL
  Filled 2024-02-06 (×7): qty 1

## 2024-02-06 MED ORDER — TETANUS-DIPHTH-ACELL PERTUSSIS 5-2-15.5 LF-MCG/0.5 IM SUSP: 0.5000 mL | Freq: Once | INTRAMUSCULAR | Status: DC

## 2024-02-06 MED ORDER — ZOLPIDEM TARTRATE 5 MG PO TABS
5.0000 mg | ORAL_TABLET | Freq: Every evening | ORAL | Status: DC | PRN
  Administered 2024-02-06: 5 mg via ORAL
  Filled 2024-02-06 (×2): qty 1

## 2024-02-06 MED ORDER — FERROUS SULFATE 325 (65 FE) MG PO TABS
325.0000 mg | ORAL_TABLET | ORAL | Status: DC
  Administered 2024-02-06: 325 mg via ORAL
  Filled 2024-02-06: qty 1

## 2024-02-06 MED ORDER — SIMETHICONE 80 MG PO CHEW: 80.0000 mg | CHEWABLE_TABLET | ORAL | Status: DC | PRN

## 2024-02-06 MED ORDER — COCONUT OIL OIL
1.0000 | TOPICAL_OIL | Status: DC | PRN
  Administered 2024-02-06: 1 via TOPICAL

## 2024-02-06 MED ORDER — BISACODYL 10 MG RE SUPP: 10.0000 mg | Freq: Every day | RECTAL | Status: DC | PRN

## 2024-02-06 MED ORDER — ONDANSETRON HCL 4 MG/2ML IJ SOLN: 4.0000 mg | INTRAMUSCULAR | Status: DC | PRN

## 2024-02-06 MED ORDER — SENNOSIDES-DOCUSATE SODIUM 8.6-50 MG PO TABS
2.0000 | ORAL_TABLET | ORAL | Status: DC
  Administered 2024-02-06: 2 via ORAL
  Filled 2024-02-06: qty 2

## 2024-02-06 MED ORDER — MEDROXYPROGESTERONE ACETATE 150 MG/ML IM SUSP: 150.0000 mg | INTRAMUSCULAR | Status: DC | PRN

## 2024-02-06 MED ORDER — OXYCODONE HCL 5 MG PO TABS: 5.0000 mg | ORAL_TABLET | ORAL | Status: DC | PRN

## 2024-02-06 MED ORDER — METHYLERGONOVINE MALEATE 0.2 MG PO TABS: 0.2000 mg | ORAL_TABLET | ORAL | Status: DC | PRN

## 2024-02-06 MED ORDER — PRENATAL MULTIVITAMIN CH
1.0000 | ORAL_TABLET | Freq: Every day | ORAL | Status: DC
  Administered 2024-02-06 – 2024-02-07 (×2): 1 via ORAL
  Filled 2024-02-06 (×2): qty 1

## 2024-02-06 MED ORDER — FLEET ENEMA RE ENEM: 1.0000 | ENEMA | Freq: Every day | RECTAL | Status: DC | PRN

## 2024-02-06 MED ORDER — LOPERAMIDE HCL 2 MG PO CAPS
4.0000 mg | ORAL_CAPSULE | Freq: Once | ORAL | Status: AC
  Administered 2024-02-06: 4 mg via ORAL
  Filled 2024-02-06: qty 2

## 2024-02-06 MED ORDER — WITCH HAZEL-GLYCERIN EX PADS: 1.0000 | MEDICATED_PAD | CUTANEOUS | Status: DC | PRN

## 2024-02-06 MED ORDER — NIFEDIPINE ER OSMOTIC RELEASE 30 MG PO TB24
30.0000 mg | ORAL_TABLET | Freq: Every day | ORAL | Status: DC
  Administered 2024-02-06 – 2024-02-07 (×2): 30 mg via ORAL
  Filled 2024-02-06 (×2): qty 1

## 2024-02-06 MED ORDER — DIBUCAINE (PERIANAL) 1 % EX OINT: 1.0000 | TOPICAL_OINTMENT | CUTANEOUS | Status: DC | PRN

## 2024-02-06 MED ORDER — BENZOCAINE-MENTHOL 20-0.5 % EX AERO: 1.0000 | INHALATION_SPRAY | CUTANEOUS | Status: DC | PRN

## 2024-02-06 MED ORDER — METHYLERGONOVINE MALEATE 0.2 MG/ML IJ SOLN: 0.2000 mg | INTRAMUSCULAR | Status: DC | PRN

## 2024-02-06 MED ORDER — DIPHENHYDRAMINE HCL 25 MG PO CAPS: 25.0000 mg | ORAL_CAPSULE | Freq: Four times a day (QID) | ORAL | Status: DC | PRN

## 2024-02-06 MED ORDER — ACETAMINOPHEN 325 MG PO TABS: 650.0000 mg | ORAL_TABLET | ORAL | Status: DC | PRN

## 2024-02-06 MED ORDER — MEASLES, MUMPS & RUBELLA VAC ~~LOC~~ SUSR: 0.5000 mL | Freq: Once | SUBCUTANEOUS | Status: DC

## 2024-02-06 NOTE — Lactation Note (Signed)
 This note was copied from a baby's chart. Lactation Consultation Note  Patient Name: Michelle Stephens Unijb'd Date: 02/06/2024 Age:24 hours Reason for consult: Initial assessment;Early term 37-38.6wks;Infant < 5lbs  P1 mom stated she has all ready tried and baby not interested in BF. Encouraged mom to put baby to the breast every other feeding until baby get over 5.5 lbs.  Mom is giving formula every feeding and breast every other feeding.  Newborn feeding habit reviewed. Mom states understanding about the feeding plan. Mom has pump. Feeding plan: Mom is to BF every other feeding until baby gets over 5.5 lbs (or until MD says to) BF no longer than 10 min. Give formula or breast milk w/each feeding according to hours of age on the Plan of care card Pump every 3 hrs for 15 min./mom can also hand express colostrum to give to baby as well.  Call for questions or concerns or assistance latching.  Mom encouraged to feed baby 8-12 times/24 hours and with feeding cues.  Maternal Data Has patient been taught Hand Expression?: Yes Does the patient have breastfeeding experience prior to this delivery?: No  Feeding Mother's Current Feeding Choice: Breast Milk and Formula Nipple Type: Nfant Slow Flow (purple)  LATCH Score    Audible Swallowing: A few with stimulation  Type of Nipple: Everted at rest and after stimulation  Comfort (Breast/Nipple): Soft / non-tender  Hold (Positioning): Assistance needed to correctly position infant at breast and maintain latch.      Lactation Tools Discussed/Used Tools: Pump Breast pump type: Double-Electric Breast Pump Pump Education: Setup, frequency, and cleaning;Milk Storage Reason for Pumping: less than 5 lbs Pumping frequency: q 3hr  Interventions Interventions: DEBP;Education;The NICU and Your Baby book;LC Services brochure  Discharge Discharge Education: Outpatient recommendation Pump: DEBP WIC Program: Yes  Consult  Status Consult Status: Follow-up Date: 02/06/24 Follow-up type: In-patient    Daved Mcfann G 02/06/2024, 5:28 AM

## 2024-02-06 NOTE — Progress Notes (Signed)
 POSTPARTUM PROGRESS NOTE  Post Partum Day 1  Subjective:  Michelle Stephens is a 23 y.o. [id number] s/p SVD at [redacted]w[redacted]d.  No acute events overnight.  Pt reports numbness of the R leg from mid thigh to mid calf that is interfering with ambulating. States she has fallen due to the numbness. Denies or urinary incontinence. Denies problems voiding or po intake.  She denies nausea or vomiting.  Pain is well controlled.  She has had flatus. She has had bowel movement.  Lochia appropriate.   Objective: Blood pressure 123/79, pulse 79, temperature 97.9 F (36.6 C), temperature source Oral, resp. rate 18, height 5' 5 (1.651 m), weight 119.7 kg, last menstrual period 05/07/2023, SpO2 99%, unknown if currently breastfeeding.  Physical Exam:  General: alert, cooperative and no distress Chest: no respiratory distress, normal WOB on RA Abdomen: soft, nontender,  Uterine Fundus: firm, appropriately tender DVT Evaluation: No calf swelling or tenderness Extremities: no edema, reduced sensation in the R leg from mid thigh to mid calf Skin: warm, dry   Recent Labs    02/04/24 1604  HGB 13.0  HCT 37.4    Assessment/Plan: Michelle Stephens is a 56 y.o. H5E8968 s/p SVD at [redacted]w[redacted]d   PPD#1 - Doing well Contraception: undecided Feeding: breast feeding Dispo: Plan for discharge 12/13. GHTN: Continue Lasix 20 mg daily and Kcl 20 meq daily x 5 days Numbness: Consulted anesthesia who plans on rounding on patient, appreciate recommendations. PT eval and treat. Will likely need to d/c with PT.   LOS: 2 days   Jerrie Gathers, DO, CNM 02/06/2024, 7:17 AM

## 2024-02-06 NOTE — Progress Notes (Signed)
 During our conversation this morning patient stated that her right leg is still numb and she had two falls early in the morning (between 5 am and 6 am). Michelle Stephens said she did not fall hard and just slid to the floor. MD was made aware. No new orders at this time. Yellow bracelet and socks on in place. Fall precaution signs are on inside the room and outside of door. Will use wheelchair to visit newborn in the NICU. Will continue to monitor post fall patient.

## 2024-02-06 NOTE — Progress Notes (Signed)
°   02/06/24 1200  What Happened  Was fall witnessed? No  Patient found other (Comment) (in bed)  Found by No one-pt stated they fell  Provider Notification  Provider Name/Title Darren Jernigan, MD  Date Provider Notified 02/06/24  Time Provider Notified 1200  Method of Notification Page (Epic secure chat)  Notification Reason Fall  Provider response No new orders;Other (Comment) (was already aware)  Date of Provider Response 02/06/24  Time of Provider Response 1200  Follow Up  Family notified Yes - comment (at bedside)  Time family notified 1200  Additional tests No  Simple treatment Other (comment) (said she doesnt know what she fell on and nothing hurts)  Progress note created (see row info) Yes  Antepartum Maternal Newborn Falls Risk Category/Interventions  Patient Fall Risk Level High fall risk  Maternal Newborn Fall Risk Interventions High fall risk  POSTPARTUM Maternal Newborn Falls Risk  Postpartum Maternal Newborn Automatic High Risk Category Mom fall or baby drop this admission.  Couplet will remain High Risk during entire admnission  Postpartum Maternal Newborn Falls Risk Category/Interventions  Maternal Newborn Fall Risk Interventions High fall risk

## 2024-02-06 NOTE — Patient Instructions (Signed)

## 2024-02-06 NOTE — Clinical Social Work Maternal (Addendum)
 CLINICAL SOCIAL WORK MATERNAL/CHILD NOTE  Patient Details  Name: Michelle Stephens MRN: [id number] Date of Birth: 2000-08-09  Date:  02/06/2024  Clinical Social Worker Initiating Note:  Nat Gibson, LCSW Date/Time: Initiated:  02/06/24/1115     Child's Name:  Michelle Stephens   Biological Parents:  Mother (Mother: Michelle Stephens 11[birth year]-1999)   Need for Interpreter:  None   Reason for Referral:  Current Substance Use/Substance Use During Pregnancy  , Behavioral Health Concerns, Parental Support of Premature Babies < 32 weeks/or Critically Ill babies   Address:  [address] [place] [address]    Phone number:  [phone] (home)     Additional phone number:   Household Members/Support Persons (HM/SP):   Household Member/Support Person 1   HM/SP Name Relationship DOB or Age  HM/SP -1 Michelle Stephens Best Friend 04-24-1999  HM/SP -2        HM/SP -3        HM/SP -4        HM/SP -5        HM/SP -6        HM/SP -7        HM/SP -8          Natural Supports (not living in the home):  Friends, Spouse/significant other   Professional Supports: None   Employment: Unemployed   Type of Work:     Education:  9 to 11 years   Homebound arranged: No  Financial Resources:  [employer]   Other Resources:  [employer]   Cultural/Religious Considerations Which May Impact Care:    Strengths:  Ability to meet basic needs  , Home prepared for child  , Psychotropic Medications   Psychotropic Medications:  Zoloft , Other meds (Hydroxyzine )      Pediatrician:       Pediatrician List:   [facility] [place]    [place]    [facility]      Pediatrician Fax Number:    Risk Factors/Current Problems:  Substance Use  , Mental Health Concerns  , Basic Needs  , Transportation     Cognitive State:  Able to Concentrate     Mood/Affect:  Calm  , Interested  , Tearful     CSW Assessment: CSW received consult for Housing,  Transportation, Drug exposed NB, NICU admission. CSW met with [name] to offer support and complete assessment.  Of note, the infant was still on Mother/Baby unit during the time of the assessment.   CSW met with [name] at bedside room 4S07 and introduced CSW role. [name] appeared to be crying. CSW inquired about [name]'s tearfulness. [name] shared that she had just learned that she was being put out of the [employer] where she had been staying. [name] expressed concern about her cat and belongings being thrown on the street and stated that she did not have anyone that could immediately offer assistance. CSW acknowledged [name]'s frustration and offered to call the hotel to offer support. With [name]'s consent CSW spoke with hotel front desk management. [employer] staff agreed to pack [name]'s belongings in boxes and allow her to retrieve her items by five. Hotel Management also stated that she would place [name]'s cat in a crate and requested someone pick the cat up before 1:30. [name] agreed and arranged for her boyfriend to assist with her cat and belongings. [name] expressed appreciation for CSW's support.   CSW inquired [name]'s demographic information on  hospital file was correct. [name] reported that her demographic information on hospital file was correct and identified the address as her mailing address only. She reported that she and the baby would be staying with her best friend Michelle Stephens, 04/24/1999) at [address] [place], [place] [address]. CSW offered to provide [name] with additional housing resource via the Find Help website. [name] gave permission to send the resources to her phone. CSW inquired about FOB involvement. [name] reported that FOB is not her current boyfriend and would not be involved. CSW inquired about [name]'s support system. [name] identified her sister, best friends and boy friend as her primary sources of support.   CSW inquired about [name]'s mental health history. [name] reported that she has schizoaffective disorder, bipolar  disorder AdHD, PTSD and anxiety. [name] explained that most of her mental health concerns started approximately four years ago after her mother's death. [name] reported that she has a history of having auditory and visual hallucinations that are stressed induced. [name] reported that she was recently diagnosed with bipolar but uncertain of the type. [name] reported that she manages her mental health with the medication Zoloft  and hydroxyzine . She reported that she also sees [facility] counselor [facility] every two weeks which she feels has been helpful. [name] reported that she will continue her current mental health treatment during the postpartum period. [name] expressed concerns about experiencing postpartum psychosis and stated that her mother experienced psychosis after having giving birth. CSW acknowledged [name]'s concerns. CSW provided education regarding the baby blues period vs. perinatal mood disorders, discussed treatment and gave additional crisis resources for mental health if concerns arise. CSW encouraged [name] to return to MAU or any ED if she felt she was in a crisis and also encouraged her to call 911, if needed. [name] reported that she had made her friend aware of her mental health concerns and her friend agreed to help monitor her mental health and agreed seek help for [name], if needed. CSW recommended [name] complete a self-evaluation during the postpartum time period using the New Mom Checklist from Postpartum Progress and encouraged [name] to contact a medical professional if symptoms are noted at any time. CSW assessed [name] for safety. [name] denied SI/HI and DV concerns.   CSW inquired about [name]'s noted substance use history. [name] disclosed that she is currently active with [facility] for Suboxone  treatment. She denied having cravings and stated the Suboxone  is working well. [name] reported that she used Fentanyl , THC and cocaine  during the pregnancy and her last use of Fentanyl  and cocaine  was in the month of  September. She reported that she continued to use THC (pre-rolled blunts) that she purchased at a CBD shop. CSW acknowledged her ongoing sobriety and informed [name] about the hospital drug screen policy. CSW informed [name] that CSW would be making a report to CPS regarding the infant's positive UDS for THC. CSW would also inform CPS about her current housing situation and substance use history while also noting [name]'s current substance use treatment. [name] verbalized understanding; and asked about the process. CSW explained the reporting process and informed [name] that CPS would follow up with her. [name] verbalized understanding.   CSW inquired if [name] had all essential items to care for her infant. [name] reported that she has all essential items and would be able to purchase a car seat prior to infant's discharge. CSW provided review of Sudden Infant Death Syndrome (SIDS) precautions. [name] verbalized understanding. CSW inquired if [name] had chosen a  pediatrician for her infant. [name] reported that she is still deciding and stated that she would also figure out transportation. CSW provided [name] with resources for Medicaid transportation and encouraged her to utilize the services.   -CSW made a report to [facility] CPS regarding infant's positive UDS for [facility], and housing concerns and her substance use history. CSW will continue to monitor the infant CDS and make a report to CPS.   CSW will continue to offer support and resources to family while infant remains in NICU.   CSW Plan/Description:  No Further Intervention Required/No Barriers to Discharge, Sudden Infant Death Syndrome (SIDS) Education, Perinatal Mood and Anxiety Disorder (PMADs) Education, Hospital Drug Screen Policy Information, Child Protective Service Report  , CSW Will Continue to Monitor Umbilical Cord Tissue Drug Screen Results and Make Report if Warranted    Nat DELENA Quiet, LCSW 02/06/2024, 3:14 PM

## 2024-02-07 ENCOUNTER — Other Ambulatory Visit (HOSPITAL_COMMUNITY): Payer: Self-pay

## 2024-02-07 MED ORDER — FAMOTIDINE 20 MG PO TABS
20.0000 mg | ORAL_TABLET | Freq: Two times a day (BID) | ORAL | 2 refills | Status: AC
  Filled 2024-02-07: qty 60, 30d supply, fill #0

## 2024-02-07 MED ORDER — HYDROXYZINE HCL 25 MG PO TABS
25.0000 mg | ORAL_TABLET | Freq: Four times a day (QID) | ORAL | 1 refills | Status: DC | PRN
  Filled 2024-02-07: qty 30, 8d supply, fill #0

## 2024-02-07 MED ORDER — FUROSEMIDE 20 MG PO TABS
20.0000 mg | ORAL_TABLET | Freq: Every day | ORAL | 0 refills | Status: AC
  Filled 2024-02-07: qty 3, 3d supply, fill #0

## 2024-02-07 MED ORDER — ACETAMINOPHEN 500 MG PO TABS
1000.0000 mg | ORAL_TABLET | Freq: Four times a day (QID) | ORAL | 0 refills | Status: AC | PRN
  Filled 2024-02-07: qty 100, 13d supply, fill #0

## 2024-02-07 MED ORDER — IBUPROFEN 600 MG PO TABS
600.0000 mg | ORAL_TABLET | Freq: Four times a day (QID) | ORAL | 0 refills | Status: AC
  Filled 2024-02-07: qty 30, 8d supply, fill #0

## 2024-02-07 MED ORDER — POTASSIUM CHLORIDE CRYS ER 20 MEQ PO TBCR
20.0000 meq | EXTENDED_RELEASE_TABLET | Freq: Every day | ORAL | 0 refills | Status: AC
  Filled 2024-02-07: qty 3, 3d supply, fill #0

## 2024-02-07 MED ORDER — NIFEDIPINE ER 30 MG PO TB24
30.0000 mg | ORAL_TABLET | Freq: Every day | ORAL | 2 refills | Status: AC
  Filled 2024-02-07: qty 30, 30d supply, fill #0

## 2024-02-07 MED ORDER — SERTRALINE HCL 100 MG PO TABS
150.0000 mg | ORAL_TABLET | Freq: Every day | ORAL | 3 refills | Status: DC
  Filled 2024-02-07: qty 45, 30d supply, fill #0

## 2024-02-07 MED ORDER — POLYETHYLENE GLYCOL 3350 17 GM/SCOOP PO POWD
17.0000 g | Freq: Every day | ORAL | 1 refills | Status: AC | PRN
  Filled 2024-02-07: qty 476, 28d supply, fill #0

## 2024-02-07 NOTE — Anesthesia Postprocedure Evaluation (Signed)
 Anesthesia Post Note  Patient: Michelle Stephens  Procedure(s) Performed: AN AD HOC LABOR EPIDURAL     Patient location during evaluation: Mother Baby Anesthesia Type: Epidural Level of consciousness: awake and alert Pain management: pain level controlled Vital Signs Assessment: post-procedure vital signs reviewed and stable Respiratory status: spontaneous breathing, nonlabored ventilation and respiratory function stable Cardiovascular status: stable Postop Assessment: no headache, no backache and epidural receding Anesthetic complications: no   No notable events documented.  Last Vitals:  Vitals:   02/06/24 2028 02/07/24 0657  BP: 124/85 116/71  Pulse:  69  Resp:  16  Temp:  36.6 C  SpO2:  98%    Last Pain:  Vitals:   02/07/24 0657  TempSrc: Oral  PainSc:    Pain Goal:                   [name]

## 2024-02-07 NOTE — Evaluation (Signed)
 Physical Therapy Evaluation Patient Details Name: Michelle Stephens MRN: [id number] DOB: 08-20-2000 Today's Date: 02/07/2024  History of Present Illness  Michelle Stephens is a 23 y.o. female G4P0030 at [redacted]w[redacted]d presented 02/04/24 for induction of labor for severe fetal growth restriction and non-reactive NST. PMH: ADHD, anxiety, asthma, depression, eczema, GERD, heart murmur, JRA, obesity, vision abnormalities   Clinical Impression  Pt presents with condition above and deficits mentioned below, see PT Problem List. The pt plans to live with a friend in their apartment at d/c. The pt is unsure of the apartment set-up. At baseline, the pt is independent. Currently, the pt is demonstrating some mild R leg weakness and reports some R foot sensory deficits when weight bearing. She reports these symptoms began after baby delivery and they have been improving gradually since then. She reports x1 fall yesterday (02/06/24) morning and none since then, endorsing that her strength and sensation in her R leg have greatly improved since then. I anticipate these symptoms will continue to improve quickly and thus she will likely not need post acute PT follow-up. However, educated pt to reach out to PCP for PT referral if symptoms do not resolve on their own in the next week or two. Educated pt to not stand/ambulate and carry her baby at this time until she is no longer falling or having close falls. Educated pt on heel and toe raises when standing to address ankle strength deficits. She verbalized understanding. She is currently mobilizing without need for an AD or physical assistance. She did not have any LOB or close falls this session. Will continue to follow acutely to maximize her return to baseline prior to d/c home.      If plan is discharge home, recommend the following: Help with stairs or ramp for entrance;Assistance with cooking/housework;Other (comment) (assistance with caring for her baby)   Can travel  by private vehicle        Equipment Recommendations None recommended by PT  Recommendations for Other Services       Functional Status Assessment Patient has had a recent decline in their functional status and demonstrates the ability to make significant improvements in function in a reasonable and predictable amount of time.     Precautions / Restrictions Precautions Precautions: Fall Precaution/Restrictions Comments: hx of falls while here prior to PT beginning Restrictions Weight Bearing Restrictions Per Provider Order: No      Mobility  Bed Mobility Overal bed mobility: Modified Independent             General bed mobility comments: HOB slightly elevated, no assistance needed to enter/exit bed    Transfers Overall transfer level: Independent Equipment used: None               General transfer comment: No LOB or assistance needed to stand from EOB or commode    Ambulation/Gait Ambulation/Gait assistance: Supervision Gait Distance (Feet): 20 Feet (x2 bouts of ~20 ft each bout) Assistive device: None Gait Pattern/deviations: Step-through pattern, Decreased stride length Gait velocity: mildly reduced Gait velocity interpretation: >2.62 ft/sec, indicative of community ambulatory   General Gait Details: Mildly decreased stride length, but no buckling and no overt LOB noted. Supervision for safety. Distance limited due to pt being woken up for session and wanting to return to bed due to not sleeping much last night.  Stairs            Wheelchair Mobility     Tilt Bed    Modified Rankin (Stroke  Patients Only)       Balance Overall balance assessment: History of Falls, No apparent balance deficits (not formally assessed) (hx of fall the morning of 02/06/24 per pt, but none since then; pt reports R leg deficits have improved greatly over the past 24 hours)                                           Pertinent Vitals/Pain Pain  Assessment Pain Assessment: Faces Faces Pain Scale: No hurt Pain Intervention(s): Monitored during session    Home Living Family/patient expects to be discharged to:: Private residence Living Arrangements: Non-relatives/Friends Available Help at Discharge: Friend(s);Available 24 hours/day Type of Home: Apartment Home Access:  (pt unsure)       Home Layout:  (pt unsure) Home Equipment: None      Prior Function Prior Level of Function : Independent/Modified Independent             Mobility Comments: no AD       Extremity/Trunk Assessment   Upper Extremity Assessment Upper Extremity Assessment: Overall WFL for tasks assessed    Lower Extremity Assessment Lower Extremity Assessment: RLE deficits/detail RLE Deficits / Details: noted weakness with poor continuous contractions of muscles with MMT, grossly 4/5 (5/5 on L) throughout; no sensory deficits when resting supine but reports some mild sensory deficits in foot when standing/ambulating; pt reports strength and sensation have quickly been improving since labor RLE Sensation: decreased light touch    Cervical / Trunk Assessment Cervical / Trunk Assessment: Normal  Communication   Communication Communication: No apparent difficulties    Cognition Arousal: Alert Behavior During Therapy: WFL for tasks assessed/performed   PT - Cognitive impairments: No apparent impairments                         Following commands: Intact       Cueing Cueing Techniques: Verbal cues     General Comments General comments (skin integrity, edema, etc.): She reports being shaky and sweating all night, notified RN; Found vape pen in pt's bed, confiscated it and gave it to RN; Educated pt to not stand/ambulate and carry her baby at this time until she is no longer falling or having close falls. Educated pt on heel and toe raises when standing to address ankle strength deficits. She verbalized understanding.    Exercises      Assessment/Plan    PT Assessment Patient needs continued PT services  PT Problem List Decreased strength;Decreased balance;Impaired sensation       PT Treatment Interventions DME instruction;Gait training;Stair training;Functional mobility training;Therapeutic activities;Therapeutic exercise;Balance training;Neuromuscular re-education;Patient/family education    PT Goals (Current goals can be found in the Care Plan section)  Acute Rehab PT Goals Patient Stated Goal: to go see her baby PT Goal Formulation: With patient Time For Goal Achievement: 02/21/24 Potential to Achieve Goals: Good    Frequency Min 1X/week     Co-evaluation               AM-PAC PT 6 Clicks Mobility  Outcome Measure Help needed turning from your back to your side while in a flat bed without using bedrails?: None Help needed moving from lying on your back to sitting on the side of a flat bed without using bedrails?: None Help needed moving to and from a bed to a chair (including a wheelchair)?:  None Help needed standing up from a chair using your arms (e.g., wheelchair or bedside chair)?: None Help needed to walk in hospital room?: A Little Help needed climbing 3-5 steps with a railing? : A Little 6 Click Score: 22    End of Session   Activity Tolerance: Patient tolerated treatment well Patient left: in bed;with call bell/phone within reach;with family/visitor present (no bed alarm needed per RN) Nurse Communication: Mobility status;Other (comment) (vape pen found and gave to RN) PT Visit Diagnosis: History of falling (Z91.81);Muscle weakness (generalized) (M62.81)    Time: 9196-9184 PT Time Calculation (min) (ACUTE ONLY): 12 min   Charges:   PT Evaluation $PT Eval Low Complexity: 1 Low   PT General Charges $$ ACUTE PT VISIT: 1 Visit         Theo Ferretti, PT, DPT Acute Rehabilitation Services  Office: [phone]   Theo CHRISTELLA Ferretti 02/07/2024, 12:08 PM

## 2024-02-07 NOTE — Discharge Instructions (Signed)

## 2024-02-07 NOTE — Progress Notes (Signed)
 CSW was consulted due to Edinburgh score of 18 and answer of hardly ever to questions of self harm. CSW met with [name] at infant's bedside in NICU room 3S16C. When CSW entered the room, [name] was observed standing in infant's room. [name]'s boyfriend was present. Infant was in isolette. CSW introduced self and requested to speak with [name] alone. [name] provided verbal consent for CSW to complete visit while her boyfriend remained in the room. [name]'s boyfriend brought chair to [facility] and appeared supportive during visit, asking how he could assist [name] emotionally during the postpartum period.   CSW inquired how [name] is feeling emotionally and reviewed [name]'s Edinburgh responses. [name] was forthcoming that she has been under a lot of stress, sharing how she lost her housing through the extended stay she was at as of yesterday, 04/10/23. [name] confirmed that she and infant will be staying with her best friend at discharge and shares she was able to place her belongings in storage yesterday with her brother's assistance yesterday.  [name] reports she has felt worried about infant's breathing and possible withdrawal symptoms. [name] also shared her cat escaped from its carrier and ran away outside after her brother transported it to her best friend's mom's house yesterday. CSW provided active listening and emotional support while [name] shared about recent stressors. CSW inquired about [name]'s answer of hardly ever to thoughts of self harm. [name] reports she last endorsed a thought of harming herself before she became pregnant with infant and identified infant as a reason to live. [name] denied current SI/HI.   [name] asked this CSW if it was normal for her to be sweating a lot postpartum and states that she feels she has been sweating more than normal but does not have a fever. CSW encouraged [name] to present to the MAU to be evaluated if she feels physically unwell.  CSW inquired about current mental health treatment. [name] acknowledged that she is  currently prescribed Zoloft and Hydroxyzine and has been meeting with IBH therapist Michelle Stephens during pregnancy. [name] states she has recently tried increasing her Zoloft dosage under the care of her OBGYN without symptom improvement and is considering changing antidepressants. [name] reports she has been in communication about the possibility of switching psychotropic medication with her provider but states she would prefer to see how she feels postpartum before making changes to her medications. CSW inquired if [name] has a follow up scheduled with IBH therapist, Michelle Stephens. [name] reports she is unsure (this CSW did not see follow up appointment in [name]'s chart) but states she can contact Michelle Stephens through [facility] for follow up. CSW also provided [name] with Michelle Stephens's direct phone number. [name] confirmed she has crisis and outpatient mental health resources provided by CSW Michelle Stephens yesterday. CSW reviewed crisis resources with [name] and [name] states she is aware of Behavioral Health Urgent Care walk in clinic. CSW assessed [name] for AVH, [name] denied endorsing recent AVH.  CSW stressed the importance of self care during the postpartum period and encouraged [name] to focus on caring for her basic needs, such as eating and sleeping during this emotionally challenging time. [name] states she ate lunch prior to discharging and plans on taking a shower and will reassess how she feels physically.   CSW inquired about resource needs and reviewed available resources during infant's NICU admission. [name] states her brother and friend can provide transportation but a gas card and meal vouchers would be helpful. [name] states she needs to re certify for food stamps. CSW  provided [name] with 1 gas card and 8 meal vouchers. [name] declined additional needs and this time and expressed appreciation.  CSW will continue to provide support while infant is admitted to the NICU.   Signed,  Michelle Stephens, MSW, LCSWA, LCASA 01-18-24 6:10 PM

## 2024-02-07 NOTE — Progress Notes (Signed)
 CSW acknowledges consult placed due to [name] being evicted from her housing February 02, 2024. CSW Nat RAMAN met with [name] May 25, 2023 and addressed housing insecurity. Per CSW note November 12, 2023, [name] will stay with a friend once discharged and declined assistance with transportation. Infant remains in NICU. Awaiting update from [facility] CPS regarding status of CPS report made 09-Jul-2023.   Signed,  Sharyne LOIS Roulette, MSW, LCSWA, LCASA 08/11/23 11:44 AM

## 2024-02-08 NOTE — Lactation Note (Signed)
 This note was copied from a baby's chart.  NICU Lactation Consultation Note  Patient Name: Michelle Stephens Unijb'd Date: 02/08/2024 Age:24 hours  Reason for consult: Follow-up assessment; Early term 37-38.6wks; Infant < 5lbs; Other (Comment); NICU baby; Primapara; 1st time breastfeeding; Maternal discharge (IUGR, NAS, maternal IUDS (cocaine ) SGA, baby's UDS THC (+))  SUBJECTIVE Visited with family of 55 61/42 weeks old NICU female; baby Michelle Stephens got admitted due to poor feedings in the setting of NAS. Michelle Stephens is a P1 and reported she's been pumping and getting small amounts of breastmilk (see maternal assessment), enough to use for 1/2 of baby's feedings, she was pumping when entered the room, praised her for all her efforts. She got discharged a couple of days ago. Reviewed discharge education, pumping schedule and the importance of consistent and frequent pumping for the onset of secretory activation and the prevention of engorgement.   Offered to set up a DEBP in the room but she prefers to use her own. Provided a Dr. Delores bag to sanitize pump pieces and assisted with filling in her pumping log. Asked her to call for assistance if she decides taking baby to breast.   OBJECTIVE Infant data: Mother's Current Feeding Choice: Breast Milk and Formula  O2 Device: HHFNC O2 Flow Rate (L/min): 4 L/min FiO2 (%): 21 %  Infant feeding assessment IDFS - Readiness: 5 (cpap) IDFS - Quality: -- (cpap)   Maternal data: [id number] Vaginal, Spontaneous Significant Breast History:: (+) breast change during the pregnancy, started leaking at 20 weeks Current breast feeding challenges:: NICU admission Pumping frequency: 5 times/24 hours Pumped volume: 15 mL Flange Size: 21; 24 (using her own pump) Risk factor for low/delayed milk supply:: primipara, SGA, IUGR, < 5 lbs  WIC Program: Yes Pump: Personal, DEBP (bbluv portable pump (gift))  ASSESSMENT Infant: Feeding Status: Scheduled  8-11-2-5 Feeding method: Tube/Gavage (Bolus)  Maternal: Milk volume: Low No S/S of engorgement at this time  INTERVENTIONS/PLAN Interventions: Interventions: Breast feeding basics reviewed; DEBP; Education; CDC milk storage guidelines; CDC Guidelines for Breast Pump Cleaning; NICU Pumping Log Discharge Education: Engorgement and breast care Tools: Pump; Flanges; Coconut oil; Other (comment) (Dr. Delores sanitizing bag) Pump Education: Setup, frequency, and cleaning; Milk Storage  Plan: STS around care times Pump both breasts on maintain mode every 3 hours for 15-30 minutes; ideally 8 pumping sessions/24 hours  No other support person at this time. All questions and concerns answered, family to contact [facility] services PRN.   Consult Status: NICU follow-up NICU Follow-up type: Weekly NICU follow up   Michelle Stephens S Miriam 02/08/2024, 2:57 PM

## 2024-02-09 ENCOUNTER — Telehealth (HOSPITAL_COMMUNITY): Payer: Self-pay | Admitting: *Deleted

## 2024-02-09 LAB — SURGICAL PATHOLOGY

## 2024-02-09 NOTE — Lactation Note (Signed)
 This note was copied from a baby's chart.  NICU Lactation Consultation Note  Patient Name: Michelle Stephens Date: 02/09/2024 Age:24 days  Reason for consult: Follow-up assessment; Primapara; 1st time breastfeeding; NICU baby; Early term 37-38.6wks; Infant < 5lbs  SUBJECTIVE  LC in to visit with P1 Mom of baby Michelle Stephens in the NICU.  Echocardiogram showed heart anomaly and baby to be transferred to [facility] for further care.  Mom has been pumping every 3 hrs and volume noted to be increasing today.  Encouraged Mom to avoid engorgement by pumping consistently.  Mom to use the Medela Symphony in baby's room as it is more effective at supporting a full milk supply.  Mom provided with bottles and LC sanitized the pump parts.  Engorgement prevention and treatment reviewed.  Mom denies any further questions.   OBJECTIVE Infant data: Mother's Current Feeding Choice: Breast Milk and Formula  O2 Device: HHFNC O2 Flow Rate (L/min): 4 L/min FiO2 (%): 21 %  Infant feeding assessment IDFS - Readiness: 5 IDFS - Quality: -- (cpap)   Maternal data: [id number] Vaginal, Spontaneous Significant Breast History:: (+) breast change during the pregnancy, started leaking at 20 weeks Current breast feeding challenges:: NICU admission Pumping frequency: every 3 hrs Pumped volume: 80 mL (volume increased from 10 ml to 80 ml at noon today) Flange Size: 21 Risk factor for low/delayed milk supply:: primipara, SGA, IUGR, < 5 lbs  WIC Program: Yes Pump: Personal, Hands Free (hands free pump given as a gift)  ASSESSMENT Infant:  Feeding Status: Scheduled 8-11-2-5 Feeding method: Tube/Gavage (Bolus)  Maternal: Milk volume: Normal  INTERVENTIONS/PLAN Interventions: Interventions: Breast feeding basics reviewed; Skin to skin; Breast massage; Hand express; DEBP; Education Discharge Education: Engorgement and breast care Tools: Pump; Flanges Pump Education: Setup, frequency, and cleaning;  Milk Storage  Plan: Consult Status: Complete (Baby being transferred to [facility]) NICU Follow-up type: Baby's discharge (transfer to [facility])   Claudene Aleck BRAVO 02/09/2024, 3:02 PM

## 2024-02-09 NOTE — Telephone Encounter (Signed)
 This RN notified on-call provider, Dr. Kandis, of patient's elevated EPDS score on 02/07/24, with answer of hardly ever to question #10, per policy. Patient already being seen by [facility], according to LCSW assessment. No other follow-up needed at this time. Allean IVAR Carton, RN, 02/09/24, [phone]

## 2024-02-10 ENCOUNTER — Encounter: Payer: MEDICAID | Admitting: Family Medicine

## 2024-02-10 NOTE — Progress Notes (Signed)
 Subjective:   Virtual Visit via Video Note  I connected with Michelle Stephens on 02/10/2024 at  3:55 PM EST by a video enabled telemedicine application and verified that I am speaking with the correct person using two identifiers.  Location: Patient: Home Provider: private exam room   I discussed the limitations of evaluation and management by telemedicine and the availability of in person appointments. The patient expressed understanding and agreed to proceed.   Michelle Stephens is a 23 y.o. [phone] at [redacted]w[redacted]d being seen today for ongoing prenatal care.  She is currently monitored for the following issues for this high-risk pregnancy and has MDD (major depressive disorder), recurrent episode, severe; PTSD (post-traumatic stress disorder); ADHD (attention deficit hyperactivity disorder), combined type; Self-inflicted injury; Supervision of high risk pregnancy, antepartum; Opioid use disorder, severe, in early remission (HCC); Substance abuse affecting pregnancy, antepartum (HCC); Syphilis affecting pregnancy; Rubella non-immune status, antepartum; Housing insecurity; JIA (juvenile idiopathic arthritis), oligoarthritis, persistent (HCC); Single umbilical artery affecting management of mother in singleton pregnancy, antepartum; Suboxone  maintenance treatment complicating pregnancy, antepartum (HCC); IUGR (intrauterine growth restriction) affecting care of mother; Fetal growth restriction antepartum; and Vaginal delivery on their problem list.  Patient reports occasional contractions.  Contractions: Irritability. Vag. Bleeding: None.  Movement: Present. Denies leaking of fluid.   The following portions of the patient's history were reviewed and updated as appropriate: allergies, current medications, past family history, past medical history, past social history, past surgical history and problem list. Problem list updated.  Did not go to MAU after last appt as recommended after NR NST. Reports  that baby continues to be active. Unable to come to office in person for today's visit due to transportation issues so appt converted to virtual. MFM cancelled her US  and dopplers due to weather closing rescheduled for tomorrow.   Stable on Suboxone  32 mg QD. Denies withdrawal Sx or cravings.   Objective:   Vitals:   02/03/24 1727  BP: 134/87    Fetal Status:     Movement: Present     General:  Alert, oriented and cooperative. Patient is in no acute distress.  Respiratory: Normal respiratory effort, no problems with respiration noted  Mental Status: Normal mood and affect. Normal behavior. Normal judgment and thought content.   Urinalysis:      PDMP reviewed during this encounter.   Last UDS: Lab Results  Component Value Date   CREATIUR 216 01/27/2024   RPR 1:1  Assessment and Plan:  Pregnancy: H5E8968 at [redacted]w[redacted]d 1. Syphilis affecting pregnancy in third trimester (Primary) - RPR 1:1 last visit. No evidence of new infection or Tx failure.  2. Supervision of high risk pregnancy, antepartum  3. Substance abuse affecting pregnancy, antepartum (HCC)  4. Suboxone  maintenance treatment complicating pregnancy, antepartum (HCC) - Stable on current dose . ToxAssure as prescribed.   5. Poor fetal growth affecting management of mother in third trimester, single or unspecified fetus - MFM Dopplers and US  02/04/24, Will likely recommend delivery.   No problem-specific Assessment & Plan notes found for this encounter.   Term labor symptoms and general obstetric precautions including but not limited to vaginal bleeding, contractions, leaking of fluid and fetal movement were reviewed in detail with the patient. Please refer to After Visit Summary for other counseling recommendations.   Return in about 1 week (around 02/10/2024) for REACH ROB.    MFM Dopplers and US  02/04/24  Future Appointments  Date Time Provider Department Center  02/13/2024 10:00 AM [facility] NURSE [facility]  [facility]   03/09/2024  1:15 PM Lola Donnice HERO, MD [facility]    Total face-to-face time with patient: 30 minutes.  Over 50% of encounter was spent on counseling and coordination of care.   Yemariam Magar  Claudene HOWARD 02/10/2024 11:30 AM [facility] [place]

## 2024-02-12 ENCOUNTER — Encounter: Payer: Self-pay | Admitting: Family Medicine

## 2024-02-12 DIAGNOSIS — F1120 Opioid dependence, uncomplicated: Secondary | ICD-10-CM

## 2024-02-12 DIAGNOSIS — F5105 Insomnia due to other mental disorder: Secondary | ICD-10-CM

## 2024-02-12 DIAGNOSIS — F332 Major depressive disorder, recurrent severe without psychotic features: Secondary | ICD-10-CM

## 2024-02-13 ENCOUNTER — Encounter: Payer: Self-pay | Admitting: Advanced Practice Midwife

## 2024-02-13 ENCOUNTER — Ambulatory Visit: Payer: Self-pay

## 2024-02-13 MED ORDER — ZOLPIDEM TARTRATE 10 MG PO TABS: 10.0000 mg | ORAL_TABLET | Freq: Every day | ORAL | 0 refills | Status: DC

## 2024-02-13 MED ORDER — BUPRENORPHINE HCL-NALOXONE HCL 8-2 MG SL FILM: 1.0000 | ORAL_FILM | Freq: Four times a day (QID) | SUBLINGUAL | 0 refills | Status: DC

## 2024-02-14 ENCOUNTER — Telehealth (HOSPITAL_COMMUNITY): Payer: Self-pay

## 2024-02-14 NOTE — Telephone Encounter (Signed)
 02/14/2024 1405  Name: Michelle Stephens MRN: [id number] DOB: [birth year]  Reason for Call:  Transition of Care Hospital Discharge Call  Contact Status: Patient Contact Status: Complete  Language assistant needed:          Follow-Up Questions: Do You Have Any Concerns About Your Health As You Heal From Delivery?: No Do You Have Any Concerns About Your Infants Health?:  (Patient is currently inpatient at [facility] hospital.)  Van Postnatal Depression Scale:  In the Past 7 Days: I have been able to laugh and see the funny side of things.: Not quite so much now I have looked forward with enjoyment to things.: Rather less than I used to I have blamed myself unnecessarily when things went wrong.: Yes, some of the time I have been anxious or worried for no good reason.: No, not at all I have felt scared or panicky for no good reason.: No, not at all Things have been getting on top of me.: Yes, sometimes I haven't been coping as well as usual I have been so unhappy that I have had difficulty sleeping.: Not very often I have felt sad or miserable.: Not very often I have been so unhappy that I have been crying.: Yes, quite often The thought of harming myself has occurred to me.: Never Van Postnatal Depression Scale Total: (!) 10  RN told patient about Maternal Mental Health Resources ([facility], National Maternal Mental Health Hotline, Postpartum Support International, National Suicide and Crisis Lifeline). Also told patient about [facility] support group offerings. Will e-mail these resources to patient as well. PHQ2-9 Depression Scale:     Discharge Follow-up: Edinburgh score requires follow up?: Yes (Patient has already been referred to couseling and currently has been seeing Warren Erla CARRIER for therapy throughout pregnancy. Patient states that she plans on seeing [name] for therapy postpartum and states she has an appointment set up.) Patient was advised of the  following resources:: Breastfeeding Support Group, Support Group  Post-discharge interventions: Maternal Mental Health Resources provided  Signature  Rosaline Deretha PEAK

## 2024-02-17 ENCOUNTER — Encounter: Payer: MEDICAID | Admitting: Family Medicine

## 2024-02-19 ENCOUNTER — Encounter: Payer: Self-pay | Admitting: Advanced Practice Midwife

## 2024-02-19 DIAGNOSIS — F1120 Opioid dependence, uncomplicated: Secondary | ICD-10-CM

## 2024-02-19 MED ORDER — BUPRENORPHINE HCL-NALOXONE HCL 8-2 MG SL FILM: 1.0000 | ORAL_FILM | Freq: Four times a day (QID) | SUBLINGUAL | 0 refills | Status: DC

## 2024-02-24 ENCOUNTER — Encounter: Payer: MEDICAID | Admitting: Advanced Practice Midwife

## 2024-02-29 ENCOUNTER — Other Ambulatory Visit: Payer: Self-pay | Admitting: Advanced Practice Midwife

## 2024-02-29 DIAGNOSIS — F5105 Insomnia due to other mental disorder: Secondary | ICD-10-CM

## 2024-02-29 MED ORDER — ZOLPIDEM TARTRATE 10 MG PO TABS: 10.0000 mg | ORAL_TABLET | Freq: Every evening | ORAL | 0 refills | Status: DC | PRN

## 2024-03-03 ENCOUNTER — Other Ambulatory Visit: Payer: Self-pay | Admitting: Advanced Practice Midwife

## 2024-03-03 DIAGNOSIS — F5105 Insomnia due to other mental disorder: Secondary | ICD-10-CM

## 2024-03-03 DIAGNOSIS — F1120 Opioid dependence, uncomplicated: Secondary | ICD-10-CM

## 2024-03-03 DIAGNOSIS — F332 Major depressive disorder, recurrent severe without psychotic features: Secondary | ICD-10-CM

## 2024-03-03 MED ORDER — BUPRENORPHINE HCL-NALOXONE HCL 8-2 MG SL FILM: 1.0000 | ORAL_FILM | Freq: Four times a day (QID) | SUBLINGUAL | 0 refills | Status: DC

## 2024-03-03 MED ORDER — SERTRALINE HCL 100 MG PO TABS: 150.0000 mg | ORAL_TABLET | Freq: Every day | ORAL | 0 refills | Status: AC

## 2024-03-09 ENCOUNTER — Ambulatory Visit: Payer: Self-pay | Admitting: Family Medicine

## 2024-04-01 MED ORDER — BUPRENORPHINE HCL-NALOXONE HCL 8-2 MG SL FILM: 1.0000 | ORAL_FILM | Freq: Four times a day (QID) | SUBLINGUAL | 0 refills | Status: AC

## 2024-04-01 MED ORDER — HYDROXYZINE HCL 25 MG PO TABS: 25.0000 mg | ORAL_TABLET | Freq: Four times a day (QID) | ORAL | 5 refills | Status: AC | PRN

## 2024-04-01 MED ORDER — ZOLPIDEM TARTRATE 10 MG PO TABS: 10.0000 mg | ORAL_TABLET | Freq: Every evening | ORAL | 0 refills | Status: AC | PRN

## 2024-04-01 NOTE — Addendum Note (Signed)
 Addended by: Zeyad Delaguila, MATEO on: 04/01/2024 03:49 PM   Modules accepted: Orders

## 2024-04-01 NOTE — Addendum Note (Signed)
 Addended by: Neiko Trivedi, MATEO on: 04/01/2024 03:48 PM   Modules accepted: Orders

## 2024-04-02 NOTE — BH Specialist Note (Unsigned)
"     Integrated Behavioral Health via Telemedicine Visit  04/02/2024 Michelle Stephens [id number]  Number of Integrated Behavioral Health Clinician visits: 5-Fifth Visit  Session Start time: 1548   Session End time: 1612  Total time in minutes: 24    Referring Provider: *** Patient/Family location: [facility] Provider location: *** All persons participating in visit: *** Types of Service: {CHL AMB TYPE OF SERVICE:[phone]}  I connected with Michelle Stephens and/or Michelle Stephens's {family members:20773} via  Telephone or Engineer, Civil (consulting)  (Video is Caregility application) and verified that I am speaking with the correct person using two identifiers. Discussed confidentiality: {YES/NO:21197}  I discussed the limitations of telemedicine and the availability of in person appointments.  Discussed there is a possibility of technology failure and discussed alternative modes of communication if that failure occurs.  I discussed that engaging in this telemedicine visit, they consent to the provision of behavioral healthcare and the services will be billed under their insurance.  [name] expressed understanding and consented to Telemedicine visit: {YES/NO:21197}  Presenting Concerns: Patient and/or family reports the following symptoms/concerns: *** Duration of problem: ***; Severity of problem: {Mild/Moderate/Severe:20260}  Patient and/or Family's Strengths/Protective Factors: {CHL AMB BH PROTECTIVE [phone]}  Goals Addressed: Patient will:  Reduce symptoms of: {IBH Symptoms:21014056}   Increase knowledge and/or ability of: {IBH Patient Tools:21014057}   Demonstrate ability to: {IBH Goals:21014053}  Progress towards Goals: {CHL AMB BH PROGRESS TOWARDS [phone]}    Interventions: Interventions utilized:  {IBH Interventions:21014054} Standardized Assessments completed: {IBH Screening Tools:21014051}    Patient  and/or Family Response: ***  Clinical Assessment/Diagnosis  No diagnosis found.    Assessment: Patient currently experiencing ***.   Patient may benefit from ***.  Plan: Follow up with behavioral health clinician on : *** Behavioral recommendations: *** Referral(s): {IBH Referrals:21014055}  I discussed the assessment and treatment plan with the patient and/or parent/guardian. They were provided an opportunity to ask questions and all were answered. They agreed with the plan and demonstrated an understanding of the instructions.   They were advised to call back or seek an in-person evaluation if the symptoms worsen or if the condition fails to improve as anticipated.  Murray Durrell C Onyx Schirmer, LCSW "
